# Patient Record
Sex: Male | Born: 1935 | Race: White | Hispanic: No | Smoking: Never smoker
Health system: Southern US, Community
[De-identification: ages and names within clinical notes are randomized; demographics above are authoritative.]

## PROBLEM LIST (undated history)

## (undated) DIAGNOSIS — N289 Disorder of kidney and ureter, unspecified: Secondary | ICD-10-CM

## (undated) DIAGNOSIS — E119 Type 2 diabetes mellitus without complications: Secondary | ICD-10-CM

---

## 2021-04-23 ENCOUNTER — Emergency Department (HOSPITAL_COMMUNITY)
Admission: EM | Admit: 2021-04-23 | Discharge: 2021-04-23 | Disposition: A | Discharge status: Home / Self Care | Payer: No Typology Code available for payment source | Attending: Emergency Medicine | Admitting: Emergency Medicine

## 2021-04-23 ENCOUNTER — Emergency Department (HOSPITAL_COMMUNITY): Payer: No Typology Code available for payment source

## 2021-04-23 ENCOUNTER — Other Ambulatory Visit: Payer: Self-pay

## 2021-04-23 DIAGNOSIS — E1165 Type 2 diabetes mellitus with hyperglycemia: Secondary | ICD-10-CM | POA: Insufficient documentation

## 2021-04-23 DIAGNOSIS — U071 COVID-19: Secondary | ICD-10-CM | POA: Diagnosis not present

## 2021-04-23 DIAGNOSIS — Z9114 Patient's other noncompliance with medication regimen: Secondary | ICD-10-CM | POA: Insufficient documentation

## 2021-04-23 DIAGNOSIS — R739 Hyperglycemia, unspecified: Secondary | ICD-10-CM

## 2021-04-23 DIAGNOSIS — Z7984 Long term (current) use of oral hypoglycemic drugs: Secondary | ICD-10-CM | POA: Diagnosis not present

## 2021-04-23 DIAGNOSIS — R531 Weakness: Secondary | ICD-10-CM | POA: Diagnosis present

## 2021-04-23 LAB — BASIC METABOLIC PANEL
Anion gap: 9 (ref 5–15)
BUN: 22 mg/dL (ref 8–23)
CO2: 28 mmol/L (ref 22–32)
Calcium: 8.7 mg/dL — ABNORMAL LOW (ref 8.9–10.3)
Chloride: 94 mmol/L — ABNORMAL LOW (ref 98–111)
Creatinine, Ser: 1.29 mg/dL — ABNORMAL HIGH (ref 0.61–1.24)
GFR, Estimated: 53 mL/min — ABNORMAL LOW (ref >60)
Glucose, Bld: 292 mg/dL — ABNORMAL HIGH (ref 70–99)
Potassium: 4.7 mmol/L (ref 3.5–5.1)
Sodium: 131 mmol/L — ABNORMAL LOW (ref 135–145)

## 2021-04-23 LAB — URINALYSIS, ROUTINE W REFLEX MICROSCOPIC
Bacteria, UA: NONE SEEN
Bilirubin Urine: NEGATIVE
Glucose, UA: >=500 mg/dL — AB
Hgb urine dipstick: NEGATIVE
Ketones, ur: NEGATIVE mg/dL
Leukocytes,Ua: NEGATIVE
Nitrite: NEGATIVE
Protein, ur: NEGATIVE mg/dL
Specific Gravity, Urine: 1.018 (ref 1.005–1.030)
pH: 6 (ref 5.0–8.0)

## 2021-04-23 LAB — CBC
HCT: 40.1 % (ref 39.0–52.0)
Hemoglobin: 13.3 g/dL (ref 13.0–17.0)
MCH: 30.5 pg (ref 26.0–34.0)
MCHC: 33.2 g/dL (ref 30.0–36.0)
MCV: 92 fL (ref 80.0–100.0)
Platelets: 122 10*3/uL — ABNORMAL LOW (ref 150–400)
RBC: 4.36 MIL/uL (ref 4.22–5.81)
RDW: 12.5 % (ref 11.5–15.5)
WBC: 4.9 10*3/uL (ref 4.0–10.5)
nRBC: 0 % (ref 0.0–0.2)

## 2021-04-23 LAB — HEPATIC FUNCTION PANEL
ALT: 18 U/L (ref 0–44)
AST: 20 U/L (ref 15–41)
Albumin: 3.5 g/dL (ref 3.5–5.0)
Alkaline Phosphatase: 71 U/L (ref 38–126)
Bilirubin, Direct: 0.1 mg/dL (ref 0.0–0.2)
Indirect Bilirubin: 0.7 mg/dL (ref 0.3–0.9)
Total Bilirubin: 0.8 mg/dL (ref 0.3–1.2)
Total Protein: 6.4 g/dL — ABNORMAL LOW (ref 6.5–8.1)

## 2021-04-23 LAB — CBG MONITORING, ED
Glucose-Capillary: 302 mg/dL — ABNORMAL HIGH (ref 70–99)
Glucose-Capillary: 262 mg/dL — ABNORMAL HIGH (ref 70–99)
Glucose-Capillary: 229 mg/dL — ABNORMAL HIGH (ref 70–99)

## 2021-04-23 LAB — RESP PANEL BY RT-PCR (FLU A&B, COVID) ARPGX2
Influenza A by PCR: NEGATIVE
Influenza B by PCR: NEGATIVE
SARS Coronavirus 2 by RT PCR: POSITIVE — AB

## 2021-04-23 MED ORDER — LACTATED RINGERS IV BOLUS
1000.0000 mL | Freq: Once | INTRAVENOUS | Status: AC
  Administered 2021-04-23: 1000 mL via INTRAVENOUS

## 2021-04-23 MED ORDER — METFORMIN HCL 500 MG PO TABS: 500.0000 mg | ORAL_TABLET | Freq: Two times a day (BID) | ORAL | 0 refills | Status: DC

## 2021-04-23 NOTE — Discharge Instructions (Addendum)
Continue Paxlovid until completed ?Drink plenty of fluids ?Take your metformin as prescribed ?Follow your blood sugars at home ?Return to the emergency department you have having worsening weakness, shortness of breath, or new pain or fevers ?

## 2021-04-23 NOTE — ED Provider Notes (Incomplete)
[facility] EMERGENCY DEPARTMENT Provider Note   CSN: [id number] Arrival date & time: 04/23/21  1812     History {Add pertinent medical, surgical, social history, OB history to HPI:1} Chief Complaint  Patient presents with   Hyperglycemia   Weakness    Paul Colon is a 88 y.o. male.  HPI 88 year old male history of type 2 diabetes supposed to be on metformin, recent COVID diagnosis, presents today with his daughter with reports of increased weakness and hyperglycemia.  She states that he began having symptoms approximately Tuesday of last week, on day day 5 of symptoms.  He was diagnosed on Friday and Paxlovid was started.  At that time his blood sugar was elevated at 330.  His daughter states that he has had increased weakness and decreased urine output and his blood sugar measured 365.  She brings him in today secondary to this.  He has been on metformin previously but has not been compliant with his medications.  He is not a smoker.    Home Medications Prior to Admission medications   Not on File      Allergies    Patient has no known allergies.    Review of Systems   Review of Systems  All other systems reviewed and are negative.  Physical Exam Updated Vital Signs BP (!) 157/64    Pulse 61    Temp 98 F (36.7 C) (Oral)    Resp 18    SpO2 99%  Physical Exam Vitals and nursing note reviewed.  Constitutional:      Appearance: Normal appearance.  HENT:     Head: Normocephalic.     Right Ear: External ear normal.     Left Ear: External ear normal.     Nose: Nose normal.     Mouth/Throat:     Mouth: Mucous membranes are dry.  Eyes:     Pupils: Pupils are equal, round, and reactive to light.  Cardiovascular:     Rate and Rhythm: Normal rate and regular rhythm.     Pulses: Normal pulses.     Heart sounds: Normal heart sounds.  Pulmonary:     Effort: Pulmonary effort is normal.  Abdominal:     General: Abdomen is flat.     Palpations: Abdomen  is soft.  Musculoskeletal:        General: No swelling. Normal range of motion.     Cervical back: Normal range of motion.     Right lower leg: No edema.     Left lower leg: No edema.  Skin:    General: Skin is warm and dry.     Capillary Refill: Capillary refill takes less than 2 seconds.  Neurological:     General: No focal deficit present.     Mental Status: He is alert.  Psychiatric:        Mood and Affect: Mood normal.    ED Results / Procedures / Treatments   Labs (all labs ordered are listed, but only abnormal results are displayed) Labs Reviewed  CBG MONITORING, ED - Abnormal; Notable for the following components:      Result Value   Glucose-Capillary 302 (*)    All other components within normal limits  CBG MONITORING, ED - Abnormal; Notable for the following components:   Glucose-Capillary 262 (*)    All other components within normal limits  BASIC METABOLIC PANEL  CBC  URINALYSIS, ROUTINE W REFLEX MICROSCOPIC    EKG None  Radiology No results  found.  Procedures Procedures  {Document cardiac monitor, telemetry assessment procedure when appropriate:1}  Medications Ordered in ED Medications - No data to display  ED Course/ Medical Decision Making/ A&P Clinical Course as of 04/23/21 2201  Sun Apr 23, 2021  1934 X-Paul Colon personally reviewed and interpreted Radiologist interpretation with left base atelectasis [DR]  2956 Basic metabolic panel(!) Basic metabolic panel reviewed and interpreted and consistent with hyperglycemia with glucose elevated at 292, anion gap normal at 9 [DR]  1950 CBC(!) [DR]  2032 We met reviewed and interpreted with hyperglycemia without any evidence of acute KA Creatinine slightly elevated at 1.29 which may be normal for patient but given his recent illness and volume depletion and suspected slightly elevated [DR]    Clinical Course User Index [DR] Paul Boss, MD                           Medical Decision Making 88 year old  male recent diagnosis of COVID, noncompliant with oral hypoglycemic agents.  Presents today with generalized weakness.  Glucose elevated at 300, no evidence of DKA, Creatinine 1.29 unknown baseline Patient hypertensive here. No definitive focal infiltrate on chest x-Tecia Cinnamon Oxygen saturation 100% Discussed results with patient and daughter.  Patient has not been taking his metformin and states that it is worse for his body than taking it.  After speaking with his daughter it does not sound like he has been following with primary care on a regular basis.  We have discussed that she can call the number on her discharge for follow-up with primary care.  He has been on Paxlovid for his COVID and appears hemodynamically stable for  Amount and/or Complexity of Data Reviewed Independent Historian:     Details: Received history from his daughter Labs: ordered. Decision-making details documented in ED Course. Radiology: ordered.     {Document critical care time when appropriate:1} {Document review of labs and clinical decision tools ie heart score, Chads2Vasc2 etc:1}  {Document your independent review of radiology images, and any outside records:1} {Document your discussion with family members, caretakers, and with consultants:1} {Document social determinants of health affecting pt's care:1} {Document your decision making why or why not admission, treatments were needed:1} Final Clinical Impression(s) / ED Diagnoses Final diagnoses:  None    Rx / DC Orders ED Discharge Orders     None

## 2021-04-23 NOTE — ED Notes (Signed)
RN called lab to add CMP. Lab technician recommended to add hepatic function panel to get the CMP since a BMP is currently in process. ?

## 2021-04-23 NOTE — ED Triage Notes (Signed)
Pt here w/ his daughter for weakness, lack of energy and hyperglycemia. Pt tested positive for covid on Friday but per daughter pt had a decrease in energy since Tuesday. Pt denies pain, but stated he hasn't been urinating much. ?

## 2021-07-13 ENCOUNTER — Ambulatory Visit: Payer: No Typology Code available for payment source

## 2021-07-13 ENCOUNTER — Ambulatory Visit (INDEPENDENT_AMBULATORY_CARE_PROVIDER_SITE_OTHER): Payer: No Typology Code available for payment source | Admitting: Podiatry

## 2021-07-13 DIAGNOSIS — M2041 Other hammer toe(s) (acquired), right foot: Secondary | ICD-10-CM | POA: Diagnosis not present

## 2021-07-13 DIAGNOSIS — E1149 Type 2 diabetes mellitus with other diabetic neurological complication: Secondary | ICD-10-CM

## 2021-07-13 DIAGNOSIS — R2681 Unsteadiness on feet: Secondary | ICD-10-CM

## 2021-07-13 DIAGNOSIS — B353 Tinea pedis: Secondary | ICD-10-CM | POA: Diagnosis not present

## 2021-07-13 DIAGNOSIS — M2042 Other hammer toe(s) (acquired), left foot: Secondary | ICD-10-CM

## 2021-07-13 DIAGNOSIS — E119 Type 2 diabetes mellitus without complications: Secondary | ICD-10-CM

## 2021-07-13 MED ORDER — KETOCONAZOLE 2 % EX CREA: 1.0000 "application " | TOPICAL_CREAM | Freq: Every day | CUTANEOUS | 0 refills | Status: DC

## 2021-07-13 NOTE — Progress Notes (Signed)
SITUATION Reason for Consult: Evaluation for Prefabricated Diabetic Shoes and Custom Diabetic Inserts. Patient / Caregiver Report: Patient would like well fitting shoes  OBJECTIVE DATA: Patient History / Diagnosis:    ICD-10-CM   1. Diabetes mellitus without complication (HCC)  XX123456     2. Acquired hammertoes of both feet  M20.41    M20.42       Physician Treating Diabetes:  [facility]  Current or Previous Devices:   None and no history  In-Person Foot Examination: Ulcers & Callousing:   None Deformities:    Hammertoes Sensation:    Intact  Shoe Size:     12W  ORTHOTIC RECOMMENDATION Recommended Devices: - 1x pair prefabricated PDAC approved diabetic shoes; Patient Selected Apex [id number] Size 12W - 3x pair custom-to-patient PDAC approved vacuum formed diabetic insoles.  GOALS OF SHOES AND INSOLES - Reduce shear and pressure - Reduce / Prevent callus formation - Reduce / Prevent ulceration - Protect the fragile healing compromised diabetic foot.  Patient would benefit from diabetic shoes and inserts as patient has diabetes mellitus and the patient has one or more of the following conditions: - History of partial or complete amputation of the foot - History of previous foot ulceration. - History of pre-ulcerative callus - Peripheral neuropathy with evidence of callus formation - Foot deformity - Poor circulation  ACTIONS PERFORMED Potential out of pocket cost was communicated to patient. Patient understood and consented to measurement and casting. Patient was casted for insoles via crush box and measured for shoes via brannock device. Procedure was explained and patient tolerated procedure well. All questions were answered and concerns addressed. [facility] form generated and sent to Dr. Jacqualyn Posey for signature and faxing. Casts were shipped to central fabrication for HOLD until Certificate of Medical Necessity or otherwise necessary authorization from insurance is  obtained.  PLAN Shoes are to be ordered and casts released from hold once all appropriate paperwork is complete. Patient is to be contacted and scheduled for fitting once shoes and insoles have been fabricated and received.

## 2021-08-01 ENCOUNTER — Telehealth: Payer: Self-pay

## 2021-08-01 NOTE — Telephone Encounter (Signed)
CMN Received - Shoes ordered and casts released from fabrication hold.  

## 2021-08-25 ENCOUNTER — Emergency Department (HOSPITAL_COMMUNITY): Payer: No Typology Code available for payment source

## 2021-08-25 ENCOUNTER — Encounter (HOSPITAL_COMMUNITY): Payer: Self-pay

## 2021-08-25 ENCOUNTER — Emergency Department (HOSPITAL_COMMUNITY)
Admission: EM | Admit: 2021-08-25 | Discharge: 2021-08-25 | Disposition: A | Discharge status: Home / Self Care | Payer: No Typology Code available for payment source | Attending: Emergency Medicine | Admitting: Emergency Medicine

## 2021-08-25 ENCOUNTER — Other Ambulatory Visit: Payer: Self-pay

## 2021-08-25 DIAGNOSIS — E871 Hypo-osmolality and hyponatremia: Secondary | ICD-10-CM

## 2021-08-25 DIAGNOSIS — Z7984 Long term (current) use of oral hypoglycemic drugs: Secondary | ICD-10-CM | POA: Insufficient documentation

## 2021-08-25 DIAGNOSIS — E1140 Type 2 diabetes mellitus with diabetic neuropathy, unspecified: Secondary | ICD-10-CM | POA: Diagnosis not present

## 2021-08-25 DIAGNOSIS — E1165 Type 2 diabetes mellitus with hyperglycemia: Secondary | ICD-10-CM | POA: Diagnosis not present

## 2021-08-25 DIAGNOSIS — R531 Weakness: Secondary | ICD-10-CM | POA: Diagnosis present

## 2021-08-25 HISTORY — DX: Disorder of kidney and ureter, unspecified: N28.9

## 2021-08-25 HISTORY — DX: Type 2 diabetes mellitus without complications: E11.9

## 2021-08-25 LAB — CBC WITH DIFFERENTIAL/PLATELET
Abs Immature Granulocytes: 0.01 10*3/uL (ref 0.00–0.07)
Basophils Absolute: 0 10*3/uL (ref 0.0–0.1)
Basophils Relative: 1 %
Eosinophils Absolute: 0.1 10*3/uL (ref 0.0–0.5)
Eosinophils Relative: 2 %
HCT: 35.7 % — ABNORMAL LOW (ref 39.0–52.0)
Hemoglobin: 12.4 g/dL — ABNORMAL LOW (ref 13.0–17.0)
Immature Granulocytes: 0 %
Lymphocytes Relative: 32 %
Lymphs Abs: 1.8 10*3/uL (ref 0.7–4.0)
MCH: 31.6 pg (ref 26.0–34.0)
MCHC: 34.7 g/dL (ref 30.0–36.0)
MCV: 90.8 fL (ref 80.0–100.0)
Monocytes Absolute: 0.5 10*3/uL (ref 0.1–1.0)
Monocytes Relative: 10 %
Neutro Abs: 3.2 10*3/uL (ref 1.7–7.7)
Neutrophils Relative %: 55 %
Platelets: 263 10*3/uL (ref 150–400)
RBC: 3.93 MIL/uL — ABNORMAL LOW (ref 4.22–5.81)
RDW: 12.4 % (ref 11.5–15.5)
WBC: 5.7 10*3/uL (ref 4.0–10.5)
nRBC: 0 % (ref 0.0–0.2)

## 2021-08-25 LAB — COMPREHENSIVE METABOLIC PANEL
ALT: 13 U/L (ref 0–44)
AST: 20 U/L (ref 15–41)
Albumin: 3.8 g/dL (ref 3.5–5.0)
Alkaline Phosphatase: 59 U/L (ref 38–126)
Anion gap: 8 (ref 5–15)
BUN: 18 mg/dL (ref 8–23)
CO2: 27 mmol/L (ref 22–32)
Calcium: 9.1 mg/dL (ref 8.9–10.3)
Chloride: 91 mmol/L — ABNORMAL LOW (ref 98–111)
Creatinine, Ser: 1.18 mg/dL (ref 0.61–1.24)
GFR, Estimated: 59 mL/min — ABNORMAL LOW (ref >60)
Glucose, Bld: 142 mg/dL — ABNORMAL HIGH (ref 70–99)
Potassium: 4.8 mmol/L (ref 3.5–5.1)
Sodium: 126 mmol/L — ABNORMAL LOW (ref 135–145)
Total Bilirubin: 0.5 mg/dL (ref 0.3–1.2)
Total Protein: 6.4 g/dL — ABNORMAL LOW (ref 6.5–8.1)

## 2021-08-25 LAB — URINALYSIS, ROUTINE W REFLEX MICROSCOPIC
Bilirubin Urine: NEGATIVE
Glucose, UA: NEGATIVE mg/dL
Hgb urine dipstick: NEGATIVE
Ketones, ur: NEGATIVE mg/dL
Leukocytes,Ua: NEGATIVE
Nitrite: NEGATIVE
Protein, ur: NEGATIVE mg/dL
Specific Gravity, Urine: 1.015 (ref 1.005–1.030)
pH: 5 (ref 5.0–8.0)

## 2021-08-25 LAB — OSMOLALITY, URINE: Osmolality, Ur: 530 mOsm/kg (ref 300–900)

## 2021-08-25 LAB — TROPONIN I (HIGH SENSITIVITY): Troponin I (High Sensitivity): 4 ng/L (ref <18)

## 2021-08-25 LAB — OSMOLALITY: Osmolality: 268 mOsm/kg — ABNORMAL LOW (ref 275–295)

## 2021-08-25 LAB — CBG MONITORING, ED: Glucose-Capillary: 129 mg/dL — ABNORMAL HIGH (ref 70–99)

## 2021-08-25 LAB — SODIUM, URINE, RANDOM: Sodium, Ur: 37 mmol/L

## 2021-08-25 LAB — CK: Total CK: 43 U/L — ABNORMAL LOW (ref 49–397)

## 2021-08-25 MED ORDER — SODIUM CHLORIDE 0.9 % IV BOLUS
1000.0000 mL | Freq: Once | INTRAVENOUS | Status: AC
  Administered 2021-08-25: 1000 mL via INTRAVENOUS

## 2021-08-25 MED ORDER — SODIUM CHLORIDE 0.9 % IV BOLUS
500.0000 mL | Freq: Once | INTRAVENOUS | Status: AC
Start: 2021-08-25 — End: 2021-08-25
  Administered 2021-08-25: 500 mL via INTRAVENOUS

## 2021-08-25 NOTE — Discharge Instructions (Addendum)
Please read and follow all provided instructions.  Your diagnoses today include:  1. Hyponatremia   2. Weakness     Tests performed today include: Blood counts and electrolytes: Sodium was a bit low at 126, but your kidney function looked good.  Your white blood cell count was also normal. Liver function testing: Was normal Blood test to check for stress on the heart/EKG: No signs of heart problems or irregular heartbeats Urine test: No signs of urine infection CT of the head: No signs of stroke or unexpected changes in the brain Vital signs. See below for your results today.   Medications prescribed:  None  Take any prescribed medications only as directed.  Home care instructions:  Follow any educational materials contained in this packet.  Please increase level of salt in your diet, either by drinking fluids like Gatorade that have electrolytes, or increasing the salt content in your food.  Your urine should be clear to light yellow.   Follow-up instructions: Please follow-up with your primary care provider in the next 3 days for further evaluation of your symptoms.   Return instructions:  Please return to the Emergency Department if you experience worsening symptoms.  Return if you have weakness in your arms or legs, slurred speech, trouble walking or talking, confusion, or trouble with your balance.  Please return if you have any other emergent concerns.  Additional Information:  Your vital signs today were: BP (!) 171/85   Pulse (!) 57   Temp 98.8 F (37.1 C) (Oral)   Resp 18   Ht 5' 11.5" (1.816 m)   Wt 70.3 kg   SpO2 99%   BMI 21.32 kg/m  If your blood pressure (BP) was elevated above 135/85 this visit, please have this repeated by your doctor within one month. --------------

## 2021-08-25 NOTE — ED Provider Triage Note (Signed)
Emergency Medicine Provider Triage Evaluation Note  Jospeh Colon , a 88 y.o. male  was evaluated in triage.  Pt complains of feeling weak.  This has been much worse over the past few days.    I received a call from the [place] PTA that his sodium is low.  He hasn't been urinating during the day much but a lot at night.    On Monday his post void was 83 per daughter.   He has been weak since Wednesday per his daughter.    Physical Exam  BP 103/68 (BP Location: Right Arm)   Pulse 76   Temp 98.8 F (37.1 C) (Oral)   Resp 18   Ht 5' 11.5" (1.816 m)   Wt 70.3 kg   SpO2 97%   BMI 21.32 kg/m  Gen:   Awake, no distress   Resp:  Normal effort  MSK:   Moves extremities without difficulty  Other:  Normal speech.   Medical Decision Making  Medically screening exam initiated at 7:25 PM.  Appropriate orders placed.  Sebastion Jun was informed that the remainder of the evaluation will be completed by another provider, this initial triage assessment does not replace that evaluation, and the importance of remaining in the ED until their evaluation is complete.     Cristina Gong, [place] 08/25/21 1929

## 2021-08-25 NOTE — ED Provider Notes (Signed)
[facility] EMERGENCY DEPARTMENT Provider Note   CSN: [id number] Arrival date & time: 08/25/21  1701     History  Chief Complaint  Patient presents with   Weakness    Paul Colon is a 88 y.o. male.  Presents to the emergency department today for weakness, orthostatic hypotension, and hyponatremia.  He has a history of diabetes and is on metformin, otherwise just eyedrops for glaucoma.  Reports some difficulty with urine stream recently.  He has been having generalized weakness over the past 2 days and went and had labs checked at the [place] today, after a hearing aid check.  At [facility], sodium was 123 and he was referred to the emergency department. Patient denies focal signs of stroke including: facial droop, slurred speech, aphasia, weakness/numbness in extremities, imbalance/trouble walking.  Does report drinking approximately 6 glasses of tap water per day.  He is not on medication for hypertension or diuretics.  He has neuropathy in his feet which causes some unsteadiness, however this has been worse over the past 2 days per the patient's daughter at bedside.        Home Medications Prior to Admission medications   Medication Sig Start Date End Date Taking? Authorizing Provider  ketoconazole (NIZORAL) 2 % cream Apply 1 application. topically daily. 07/13/21   Vivi Barrack, DPM  metFORMIN (GLUCOPHAGE) 500 MG tablet Take 1 tablet (500 mg total) by mouth 2 (two) times daily with a meal. 04/23/21   Margarita Grizzle, MD      Allergies    Patient has no known allergies.    Review of Systems   Review of Systems  Physical Exam Updated Vital Signs BP 103/68 (BP Location: Right Arm)   Pulse 76   Temp 98.8 F (37.1 C) (Oral)   Resp 18   Ht 5' 11.5" (1.816 m)   Wt 70.3 kg   SpO2 97%   BMI 21.32 kg/m   Physical Exam Vitals and nursing note reviewed.  Constitutional:      Appearance: He is well-developed.  HENT:     Head: Normocephalic and atraumatic.      Right Ear: Tympanic membrane, ear canal and external ear normal.     Left Ear: Tympanic membrane, ear canal and external ear normal.     Nose: Nose normal.     Mouth/Throat:     Pharynx: Uvula midline.  Eyes:     General: Lids are normal.     Conjunctiva/sclera: Conjunctivae normal.     Pupils: Pupils are equal, round, and reactive to light.  Cardiovascular:     Rate and Rhythm: Normal rate and regular rhythm.  Pulmonary:     Effort: Pulmonary effort is normal.     Breath sounds: Normal breath sounds.  Abdominal:     Palpations: Abdomen is soft.     Tenderness: There is no abdominal tenderness.  Musculoskeletal:        General: Normal range of motion.     Cervical back: Normal range of motion and neck supple. No tenderness or bony tenderness.  Skin:    General: Skin is warm and dry.  Neurological:     Mental Status: He is alert and oriented to person, place, and time.     GCS: GCS eye subscore is 4. GCS verbal subscore is 5. GCS motor subscore is 6.     Cranial Nerves: No cranial nerve deficit.     Sensory: No sensory deficit.     Motor: No abnormal muscle  tone.     Coordination: Coordination normal.     Gait: Gait normal.     ED Results / Procedures / Treatments   Labs (all labs ordered are listed, but only abnormal results are displayed) Labs Reviewed  URINALYSIS, ROUTINE W REFLEX MICROSCOPIC - Abnormal; Notable for the following components:      Result Value   APPearance HAZY (*)    All other components within normal limits  COMPREHENSIVE METABOLIC PANEL - Abnormal; Notable for the following components:   Sodium 126 (*)    Chloride 91 (*)    Glucose, Bld 142 (*)    Total Protein 6.4 (*)    GFR, Estimated 59 (*)    All other components within normal limits  CBC WITH DIFFERENTIAL/PLATELET - Abnormal; Notable for the following components:   RBC 3.93 (*)    Hemoglobin 12.4 (*)    HCT 35.7 (*)    All other components within normal limits  CK - Abnormal; Notable for  the following components:   Total CK 43 (*)    All other components within normal limits  OSMOLALITY - Abnormal; Notable for the following components:   Osmolality 268 (*)    All other components within normal limits  CBG MONITORING, ED - Abnormal; Notable for the following components:   Glucose-Capillary 129 (*)    All other components within normal limits  SODIUM, URINE, RANDOM  OSMOLALITY, URINE  TROPONIN I (HIGH SENSITIVITY)    EKG EKG Interpretation  Date/Time:  Friday August 25 2021 18:12:02 EDT Ventricular Rate:  72 PR Interval:  218 QRS Duration: 72 QT Interval:  364 QTC Calculation: 398 R Axis:   15 Text Interpretation: Sinus rhythm with 1st degree A-V block Confirmed by Cathren Laine ([id number]) on 08/25/2021 7:40:54 PM  Radiology CT HEAD WO CONTRAST ( )  Result Date: 08/25/2021 CLINICAL DATA:  generalized weakness x 2 days, trouble walking EXAM: CT HEAD WITHOUT CONTRAST TECHNIQUE: Contiguous axial images were obtained from the base of the skull through the vertex without intravenous contrast. RADIATION DOSE REDUCTION: This exam was performed according to the departmental dose-optimization program which includes automated exposure control, adjustment of the mA and/or kV according to patient size and/or use of iterative reconstruction technique. COMPARISON:  None Available. FINDINGS: Brain: Normal for age atrophy. No intracranial hemorrhage, mass effect, or midline shift. No hydrocephalus. The basilar cisterns are patent. Mild periventricular chronic small vessel ischemia. No evidence of territorial infarct or acute ischemia. No extra-axial or intracranial fluid collection. Vascular: Atherosclerosis of skullbase vasculature without hyperdense vessel or abnormal calcification. Skull: No fracture or focal lesion. Sinuses/Orbits: Minimal frothy debris in the right frontal sinus. No sinus fluid levels. No mastoid effusion. The visualized orbits are unremarkable. Bilateral ocular surgery.  Other: None. IMPRESSION: 1. No acute intracranial abnormality. 2. Age-related atrophy and chronic small vessel ischemia. Electronically Signed   By: Narda Rutherford M.D.   On: 08/25/2021 21:25    Procedures Procedures    Medications Ordered in ED Medications  sodium chloride 0.9 % bolus 500 mL (0 mLs Intravenous Stopped 08/25/21 2136)  sodium chloride 0.9 % bolus 1,000 mL (1,000 mLs Intravenous New Bag/Given 08/25/21 2138)    ED Course/ Medical Decision Making/ A&P    Patient seen and examined. History obtained directly from patient.   Labs/EKG: EKG personally reviewed and interpreted.  Ordered CBC, CMP, osmolality, urine sodium and osmolality, CK, troponin.  UA pending.  Imaging: CT head.  Medications/Fluids: None ordered.  Most recent vital signs reviewed and  are as follows: BP 103/68 (BP Location: Right Arm)   Pulse 76   Temp 98.8 F (37.1 C) (Oral)   Resp 18   Ht 5' 11.5" (1.816 m)   Wt 70.3 kg   SpO2 97%   BMI 21.32 kg/m   Initial impression: Generalized weakness, hyponatremia.  11:01 PM Reassessment performed. Patient appears well. Family states he looks better. Has received 1.5L NS, 500cc urinary output, light yellow color.   Patient discussed with Dr. Denton Lank, who has seen patient. Agrees with hydration, d/c with close outpatient follow-up. Pt and family in agreement. Will reassess orthostatic VS prior to d/c.   Labs personally reviewed and interpreted including: CBC with normal white blood cell count, hemoglobin 12.4 otherwise unremarkable; CMP with hyponatremia 126, chloride 91, glucose 142, creatinine at 1.18 with normal BUN; CK 43 not elevated; Trop normal; Osmolality low 268 with calculated osmolal gap 2 normal; normal urine sodium, normal urine osmolality.   Imaging personally visualized and interpreted including: CT head agree no acute findings.   Reviewed pertinent lab work and imaging with patient/family at bedside. Questions answered.   Most current  vital signs reviewed and are as follows: BP (!) 171/85   Pulse (!) 57   Temp 98.8 F (37.1 C) (Oral)   Resp 18   Ht 5' 11.5" (1.816 m)   Wt 70.3 kg   SpO2 99%   BMI 21.32 kg/m   Plan: D/c to home with close PCP f/u.   Orthostatic VS for the past 24 hrs:  BP- Lying Pulse- Lying BP- Sitting Pulse- Sitting BP- Standing at 0 minutes Pulse- Standing at 0 minutes  08/25/21 2311 179/75 59 140/78 79 127/63 63   Patient urged to return with worsening symptoms or other concerns. Patient verbalized understanding and agrees with plan.                           Medical Decision Making Amount and/or Complexity of Data Reviewed Labs: ordered. Radiology: ordered.   Patient here with generalized weakness over the past 2 days with hyponatremia, 126 here today.  Likely cause due to increase in drinking free water recently.  No diuretics or other medications.  Blood sugars relatively controlled.  Also considered cardiac etiology of generalized weakness, however EKG without arrhythmia and no ischemic findings and troponin is normal.  Considered central neurologic cause, however head CT is negative for any acute findings.  No focal neuro symptoms to suggest stroke.  Considered infection, white blood cell count is normal, no UTI, no other focal symptoms of infection.  No signs of rhabdomyolysis.  History of CKD, however kidney function is normal here today.  Patient was treated with IV fluids and seems to be doing a bit better.  Vital signs are reassuring blood pressure does drop with standing but no lightheadedness or syncope.  We will plan to have patient increase sodium in food and drink over the weekend and follow-up with PCP next week for recheck of symptoms and sodium level.  The patient's vital signs, pertinent lab work and imaging were reviewed and interpreted as discussed in the ED course. Hospitalization was considered for further testing, treatments, or serial exams/observation. However as patient  is well-appearing, has a stable exam, and reassuring studies today, I do not feel that they warrant admission at this time. This plan was discussed with the patient who verbalizes agreement and comfort with this plan and seems reliable and able to return to the  Emergency Department with worsening or changing symptoms.          Final Clinical Impression(s) / ED Diagnoses Final diagnoses:  Hyponatremia  Weakness    Rx / DC Orders ED Discharge Orders     None         Renne Crigler, PA-C 08/25/21 2319    Cathren Laine, MD 08/25/21 2350

## 2021-08-25 NOTE — ED Triage Notes (Signed)
Pt c/o weakness for past few days. Pt was seen at [facility] and sent to ED for low sodium.  Pt had orthostatics at [place] and daughter states BP went down upon standing.  139/77 while lying, 136/78 sitting, 111/71 when standing.

## 2021-09-05 ENCOUNTER — Encounter (HOSPITAL_COMMUNITY): Payer: Self-pay | Admitting: *Deleted

## 2021-09-05 ENCOUNTER — Other Ambulatory Visit: Payer: Self-pay

## 2021-09-05 ENCOUNTER — Observation Stay (HOSPITAL_COMMUNITY)
Admission: EM | Admit: 2021-09-05 | Discharge: 2021-09-07 | Disposition: A | Discharge status: Home / Self Care | Payer: No Typology Code available for payment source | Attending: Internal Medicine | Admitting: Internal Medicine

## 2021-09-05 DIAGNOSIS — Z79899 Other long term (current) drug therapy: Secondary | ICD-10-CM | POA: Insufficient documentation

## 2021-09-05 DIAGNOSIS — I129 Hypertensive chronic kidney disease with stage 1 through stage 4 chronic kidney disease, or unspecified chronic kidney disease: Secondary | ICD-10-CM | POA: Insufficient documentation

## 2021-09-05 DIAGNOSIS — E871 Hypo-osmolality and hyponatremia: Principal | ICD-10-CM | POA: Insufficient documentation

## 2021-09-05 DIAGNOSIS — D631 Anemia in chronic kidney disease: Secondary | ICD-10-CM | POA: Insufficient documentation

## 2021-09-05 DIAGNOSIS — Z8616 Personal history of COVID-19: Secondary | ICD-10-CM | POA: Insufficient documentation

## 2021-09-05 DIAGNOSIS — E119 Type 2 diabetes mellitus without complications: Secondary | ICD-10-CM

## 2021-09-05 DIAGNOSIS — R531 Weakness: Secondary | ICD-10-CM

## 2021-09-05 DIAGNOSIS — E1122 Type 2 diabetes mellitus with diabetic chronic kidney disease: Secondary | ICD-10-CM | POA: Diagnosis not present

## 2021-09-05 DIAGNOSIS — Z7984 Long term (current) use of oral hypoglycemic drugs: Secondary | ICD-10-CM | POA: Diagnosis not present

## 2021-09-05 DIAGNOSIS — N1831 Chronic kidney disease, stage 3a: Secondary | ICD-10-CM | POA: Diagnosis not present

## 2021-09-05 DIAGNOSIS — D649 Anemia, unspecified: Secondary | ICD-10-CM

## 2021-09-05 DIAGNOSIS — E1165 Type 2 diabetes mellitus with hyperglycemia: Secondary | ICD-10-CM

## 2021-09-05 LAB — CBC WITH DIFFERENTIAL/PLATELET
Abs Immature Granulocytes: 0.01 10*3/uL (ref 0.00–0.07)
Basophils Absolute: 0.1 10*3/uL (ref 0.0–0.1)
Basophils Relative: 1 %
Eosinophils Absolute: 0.2 10*3/uL (ref 0.0–0.5)
Eosinophils Relative: 3 %
HCT: 34 % — ABNORMAL LOW (ref 39.0–52.0)
Hemoglobin: 11.5 g/dL — ABNORMAL LOW (ref 13.0–17.0)
Immature Granulocytes: 0 %
Lymphocytes Relative: 34 %
Lymphs Abs: 1.8 10*3/uL (ref 0.7–4.0)
MCH: 31.3 pg (ref 26.0–34.0)
MCHC: 33.8 g/dL (ref 30.0–36.0)
MCV: 92.6 fL (ref 80.0–100.0)
Monocytes Absolute: 0.6 10*3/uL (ref 0.1–1.0)
Monocytes Relative: 10 %
Neutro Abs: 2.9 10*3/uL (ref 1.7–7.7)
Neutrophils Relative %: 52 %
Platelets: 264 10*3/uL (ref 150–400)
RBC: 3.67 MIL/uL — ABNORMAL LOW (ref 4.22–5.81)
RDW: 12.4 % (ref 11.5–15.5)
WBC: 5.5 10*3/uL (ref 4.0–10.5)
nRBC: 0 % (ref 0.0–0.2)

## 2021-09-05 LAB — URINALYSIS, ROUTINE W REFLEX MICROSCOPIC
Bilirubin Urine: NEGATIVE
Glucose, UA: NEGATIVE mg/dL
Hgb urine dipstick: NEGATIVE
Ketones, ur: NEGATIVE mg/dL
Leukocytes,Ua: NEGATIVE
Nitrite: NEGATIVE
Protein, ur: NEGATIVE mg/dL
Specific Gravity, Urine: 1.012 (ref 1.005–1.030)
pH: 7 (ref 5.0–8.0)

## 2021-09-05 LAB — COMPREHENSIVE METABOLIC PANEL
ALT: 12 U/L (ref 0–44)
AST: 16 U/L (ref 15–41)
Albumin: 3.8 g/dL (ref 3.5–5.0)
Alkaline Phosphatase: 49 U/L (ref 38–126)
Anion gap: 8 (ref 5–15)
BUN: 19 mg/dL (ref 8–23)
CO2: 28 mmol/L (ref 22–32)
Calcium: 8.6 mg/dL — ABNORMAL LOW (ref 8.9–10.3)
Chloride: 91 mmol/L — ABNORMAL LOW (ref 98–111)
Creatinine, Ser: 1.18 mg/dL (ref 0.61–1.24)
GFR, Estimated: 59 mL/min — ABNORMAL LOW (ref >60)
Glucose, Bld: 130 mg/dL — ABNORMAL HIGH (ref 70–99)
Potassium: 4.5 mmol/L (ref 3.5–5.1)
Sodium: 127 mmol/L — ABNORMAL LOW (ref 135–145)
Total Bilirubin: 0.4 mg/dL (ref 0.3–1.2)
Total Protein: 5.9 g/dL — ABNORMAL LOW (ref 6.5–8.1)

## 2021-09-05 NOTE — ED Triage Notes (Signed)
Per pt daughter, pt went to the [place] for a follow up from recent ED visit. He was told his sodium was low and needs evaluation. Pt says he feels weak and difficulty walking.

## 2021-09-05 NOTE — ED Provider Triage Note (Signed)
Emergency Medicine Provider Triage Evaluation Note  Paul Colon , a 88 y.o. male  was evaluated in triage.  Pt complains of ongoing hyponatremia.  He has been seen recently for hyponatremia, and discharged.  His daughter reports that [facility] saying [facility] was 125 today and referred to the ED.   He has been week for multiple weeks, no changes recently.  Physical Exam  BP (!) 142/69 (BP Location: Right Arm)   Pulse 60   Temp 97.7 F (36.5 C) (Oral)   Resp 17   SpO2 99%  Gen:   Awake, no distress   Resp:  Normal effort  MSK:   Moves extremities without difficulty  Other:  Normal speech  Medical Decision Making  Medically screening exam initiated at 8:50 PM.  Appropriate orders placed.  Paul Colon was informed that the remainder of the evaluation will be completed by another provider, this initial triage assessment does not replace that evaluation, and the importance of remaining in the ED until their evaluation is complete.     Cristina Gong, [place] 09/05/21 2055

## 2021-09-06 ENCOUNTER — Encounter (HOSPITAL_COMMUNITY): Payer: Self-pay | Admitting: Internal Medicine

## 2021-09-06 DIAGNOSIS — E871 Hypo-osmolality and hyponatremia: Secondary | ICD-10-CM | POA: Diagnosis not present

## 2021-09-06 DIAGNOSIS — E119 Type 2 diabetes mellitus without complications: Secondary | ICD-10-CM

## 2021-09-06 DIAGNOSIS — E1165 Type 2 diabetes mellitus with hyperglycemia: Secondary | ICD-10-CM

## 2021-09-06 DIAGNOSIS — N1831 Chronic kidney disease, stage 3a: Secondary | ICD-10-CM

## 2021-09-06 DIAGNOSIS — D649 Anemia, unspecified: Secondary | ICD-10-CM

## 2021-09-06 DIAGNOSIS — R531 Weakness: Secondary | ICD-10-CM

## 2021-09-06 LAB — OSMOLALITY: Osmolality: 274 mOsm/kg — ABNORMAL LOW (ref 275–295)

## 2021-09-06 LAB — BASIC METABOLIC PANEL
Anion gap: 9 (ref 5–15)
Anion gap: 6 (ref 5–15)
BUN: 20 mg/dL (ref 8–23)
BUN: 17 mg/dL (ref 8–23)
CO2: 23 mmol/L (ref 22–32)
CO2: 29 mmol/L (ref 22–32)
Calcium: 8.7 mg/dL — ABNORMAL LOW (ref 8.9–10.3)
Calcium: 8.6 mg/dL — ABNORMAL LOW (ref 8.9–10.3)
Chloride: 95 mmol/L — ABNORMAL LOW (ref 98–111)
Chloride: 95 mmol/L — ABNORMAL LOW (ref 98–111)
Creatinine, Ser: 1.05 mg/dL (ref 0.61–1.24)
Creatinine, Ser: 1.12 mg/dL (ref 0.61–1.24)
GFR, Estimated: >60 mL/min (ref >60)
GFR, Estimated: >60 mL/min (ref >60)
Glucose, Bld: 181 mg/dL — ABNORMAL HIGH (ref 70–99)
Glucose, Bld: 156 mg/dL — ABNORMAL HIGH (ref 70–99)
Potassium: 3.9 mmol/L (ref 3.5–5.1)
Potassium: 4.4 mmol/L (ref 3.5–5.1)
Sodium: 127 mmol/L — ABNORMAL LOW (ref 135–145)
Sodium: 130 mmol/L — ABNORMAL LOW (ref 135–145)

## 2021-09-06 LAB — IRON AND TIBC
Iron: 54 ug/dL (ref 45–182)
Saturation Ratios: 20 % (ref 17.9–39.5)
TIBC: 273 ug/dL (ref 250–450)
UIBC: 219 ug/dL

## 2021-09-06 LAB — OSMOLALITY, URINE: Osmolality, Ur: 223 mOsm/kg — ABNORMAL LOW (ref 300–900)

## 2021-09-06 LAB — GLUCOSE, CAPILLARY
Glucose-Capillary: 99 mg/dL (ref 70–99)
Glucose-Capillary: 169 mg/dL — ABNORMAL HIGH (ref 70–99)
Glucose-Capillary: 181 mg/dL — ABNORMAL HIGH (ref 70–99)

## 2021-09-06 LAB — FERRITIN: Ferritin: 307 ng/mL (ref 24–336)

## 2021-09-06 LAB — HEMOGLOBIN A1C
Hgb A1c MFr Bld: 7.3 % — ABNORMAL HIGH (ref 4.8–5.6)
Mean Plasma Glucose: 162.81 mg/dL

## 2021-09-06 LAB — POC OCCULT BLOOD, ED: Fecal Occult Bld: NEGATIVE

## 2021-09-06 LAB — CBG MONITORING, ED: Glucose-Capillary: 136 mg/dL — ABNORMAL HIGH (ref 70–99)

## 2021-09-06 LAB — SODIUM, URINE, RANDOM: Sodium, Ur: 48 mmol/L

## 2021-09-06 MED ORDER — ACETAMINOPHEN 650 MG RE SUPP: 650.0000 mg | Freq: Four times a day (QID) | RECTAL | Status: DC | PRN

## 2021-09-06 MED ORDER — INSULIN ASPART 100 UNIT/ML IJ SOLN: 0.0000 [IU] | Freq: Every day | INTRAMUSCULAR | Status: DC

## 2021-09-06 MED ORDER — ORAL CARE MOUTH RINSE: 15.0000 mL | OROMUCOSAL | Status: DC | PRN

## 2021-09-06 MED ORDER — ACETAMINOPHEN 325 MG PO TABS: 650.0000 mg | ORAL_TABLET | Freq: Four times a day (QID) | ORAL | Status: DC | PRN

## 2021-09-06 MED ORDER — INSULIN ASPART 100 UNIT/ML IJ SOLN
0.0000 [IU] | Freq: Three times a day (TID) | INTRAMUSCULAR | Status: DC
  Administered 2021-09-06: 2 [IU] via SUBCUTANEOUS
  Administered 2021-09-06 – 2021-09-07 (×2): 1 [IU] via SUBCUTANEOUS

## 2021-09-06 MED ORDER — ENOXAPARIN SODIUM 40 MG/0.4ML IJ SOSY
40.0000 mg | PREFILLED_SYRINGE | INTRAMUSCULAR | Status: DC
Start: 2021-09-06 — End: 2021-09-06

## 2021-09-06 MED ORDER — SODIUM CHLORIDE 0.9 % IV SOLN: INTRAVENOUS | Status: DC

## 2021-09-06 NOTE — ED Provider Notes (Signed)
[facility] EMERGENCY DEPARTMENT Provider Note   CSN: [id number] Arrival date & time: 09/05/21  7026     History  Chief Complaint  Patient presents with   Abnormal Labs    Parrish Bonn is a 88 y.o. male.  HPI     This is a 88 year old male who presents with abnormal labs.  Patient presents from the [place].  He had been seen at the [facility] for follow-up to the low sodium numbers last week.  He was seen and evaluated and had a sodium of 126.  Since that time he has not changed his water intake but has increased electrolyte rich fluids and increase salt in his food.  He generally describes being "weak" and having decreased energy especially at the beginning of the day.  Denies dizziness, chest pain, shortness of breath, recent fevers.  He followed up today and repeat sodium was unchanged.  They were advised to come to the emergency room.  Chart reviewed.  Patient was given a liter of fluids and discharged home with [facility] follow-[facility].  Sodium 126.  Patient lives with his daughter.  Home Medications Prior to Admission medications   Medication Sig Start Date End Date Taking? Authorizing Provider  ketoconazole (NIZORAL) 2 % cream Apply 1 application. topically daily. 07/13/21   Vivi Barrack, DPM  metFORMIN (GLUCOPHAGE) 500 MG tablet Take 1 tablet (500 mg total) by mouth 2 (two) times daily with a meal. 04/23/21   Margarita Grizzle, MD      Allergies    Patient has no known allergies.    Review of Systems   Review of Systems  Constitutional:  Negative for fever.  Neurological:  Positive for weakness. Negative for light-headedness.  All other systems reviewed and are negative.   Physical Exam Updated Vital Signs BP 134/66 (BP Location: Right Arm)   Pulse (!) 56   Temp 98.2 F (36.8 C) (Oral)   Resp 18   SpO2 99%  Physical Exam Vitals and nursing note reviewed.  Constitutional:      Appearance: He is well-developed. He is not ill-appearing.  HENT:     Head:  Normocephalic and atraumatic.  Eyes:     Pupils: Pupils are equal, round, and reactive to light.  Cardiovascular:     Rate and Rhythm: Normal rate and regular rhythm.     Heart sounds: Normal heart sounds. No murmur heard. Pulmonary:     Effort: Pulmonary effort is normal. No respiratory distress.     Breath sounds: Normal breath sounds. No wheezing.  Abdominal:     General: Bowel sounds are normal.     Palpations: Abdomen is soft.     Tenderness: There is no abdominal tenderness. There is no rebound.  Musculoskeletal:     Cervical back: Neck supple.     Right lower leg: No edema.     Left lower leg: No edema.  Lymphadenopathy:     Cervical: No cervical adenopathy.  Skin:    General: Skin is warm and dry.  Neurological:     Mental Status: He is alert and oriented to person, place, and time.  Psychiatric:        Mood and Affect: Mood normal.     ED Results / Procedures / Treatments   Labs (all labs ordered are listed, but only abnormal results are displayed) Labs Reviewed  COMPREHENSIVE METABOLIC PANEL - Abnormal; Notable for the following components:      Result Value   Sodium 127 (*)  Chloride 91 (*)    Glucose, Bld 130 (*)    Calcium 8.6 (*)    Total Protein 5.9 (*)    GFR, Estimated 59 (*)    All other components within normal limits  CBC WITH DIFFERENTIAL/PLATELET - Abnormal; Notable for the following components:   RBC 3.67 (*)    Hemoglobin 11.5 (*)    HCT 34.0 (*)    All other components within normal limits  URINALYSIS, ROUTINE W REFLEX MICROSCOPIC    EKG EKG Interpretation  Date/Time:  Tuesday September 05 2021 21:06:56 EDT Ventricular Rate:  58 PR Interval:  244 QRS Duration: 66 QT Interval:  366 QTC Calculation: 359 R Axis:   -9 Text Interpretation: Sinus bradycardia with 1st degree A-V block with Premature atrial complexes Minimal voltage criteria for LVH, may be normal variant ( R in aVL ) Cannot rule out Anterior infarct , age undetermined Abnormal  ECG When compared with ECG of 25-Aug-2021 18:12, PREVIOUS ECG IS PRESENT Confirmed by Ross Marcus ([id number]) on 09/06/2021 5:33:45 AM  Radiology No results found.  Procedures Procedures    Medications Ordered in ED Medications  0.9 %  sodium chloride infusion (has no administration in time range)    ED Course/ Medical Decision Making/ A&P                           Medical Decision Making Risk Prescription drug management. Decision regarding hospitalization.   This patient presents to the ED for concern of abnormal lab, low sodium, this involves an extensive number of treatment options, and is a complaint that carries with it a high risk of complications and morbidity.  I considered the following differential and admission for this acute, potentially life threatening condition.  The differential diagnosis includes irratrogenic free water use, central DI, malnutrition  MDM:    This is a 88 year old male who presents with low sodium.  He is nontoxic.  Vital signs are largely reassuring.  He reports generalized weakness but his physical exam is reassuring.  Repeat sodium here is 127.  I had a long discussion with the patient and his daughter.  Feel he warrants an admission for gentle fluid resuscitation with isotonic fluids and repeat sodium.  He would be at high risk for dehydration given heat if he reduced his free water significantly.  He can have his sodium monitored closely.  He also does not want to precipitously elevate or drop his sodium given risk factors.  They are agreeable to plan  (Labs, imaging, consults)  Labs: I Ordered, and personally interpreted labs.  The pertinent results include: CBC, BMP  Imaging Studies ordered: I ordered imaging studies including none I independently visualized and interpreted imaging. I agree with the radiologist interpretation  Additional history obtained from daughter at bedside.  External records from outside source obtained and  reviewed including prior evaluations  Cardiac Monitoring: The patient was maintained on a cardiac monitor.  I personally viewed and interpreted the cardiac monitored which showed an underlying rhythm of: Normal sinus rhythm  Reevaluation: After the interventions noted above, I reevaluated the patient and found that they have :stayed the same  Social Determinants of Health: Lives with daughter  Disposition: Admit  Co morbidities that complicate the patient evaluation  Past Medical History:  Diagnosis Date   Diabetes mellitus without complication (HCC)    Kidney disease      Medicines Meds ordered this encounter  Medications   0.9 %  sodium chloride infusion    I have reviewed the patients home medicines and have made adjustments as needed  Problem List / ED Course: Problem List Items Addressed This Visit   None Visit Diagnoses     Hyponatremia    -  Primary                   Final Clinical Impression(s) / ED Diagnoses Final diagnoses:  Hyponatremia    Rx / DC Orders ED Discharge Orders     None         Shon Baton, MD 09/06/21 [phone]

## 2021-09-06 NOTE — ED Notes (Signed)
RN provided patient with a urinal. Patient states "I pee every two hours especially at night.".   Patient unable to urinate in urinal, per patient kept urinal with him because it is taking him a moment to go.   Denies pain.

## 2021-09-06 NOTE — ED Notes (Signed)
After admitting provider left room, patient daughter expresses concerns of blood pressure running high as it is unusual for the patient. Daughter also is requesting to look into his enlarged prostate.   MD made aware of family concerns.

## 2021-09-06 NOTE — ED Notes (Signed)
Pt received to ER 42. Pt resting on stretcher. Pt daughter at bedside. Pt on continuous monitoring. Call bell in reach.

## 2021-09-06 NOTE — H&P (Signed)
History and Physical    Paul Colon MEQ:[id number] DOB: 10-01-1929 DOA: 09/05/2021  PCP: [facility]  Patient coming from: Home  Chief Complaint: Generalized weakness  HPI: Paul Colon is a 88 y.o. male with medical history significant of type 2 diabetes, CKD stage IIIa presented to the ED for evaluation of generalized weakness and hyponatremia noted on outpatient labs.  Labs showing WBC 5.5, hemoglobin 11.5, MCV 92.6, platelet count 264k.  Sodium 127, potassium 4.5, chloride 91, bicarb 28, BUN 19, creatinine 1.1, glucose 130.  UA without signs of infection.  Chest x-ray showing left base atelectasis.  CT head without contrast negative for acute finding.  History provided by the patient and her daughter at bedside.  He is having generalized weakness for the past 1 week and had labs done at the [place] yesterday and told that his sodium was 125.  Patient denies vomiting or diarrhea.  Per daughter, his oral intake is good and eating balanced meals.  He has been living with her since March of this year after he had COVID infection.  She does mention that he drinks several cups of water throughout the day.  Patient repeatedly told me that he drinks a lot of water but is not able to quantify how much.  Tells me he goes to the bathroom frequently to urinate.  He does not take any diuretics at home.  He denies fevers, chills, cough, shortness of breath, chest pain, or abdominal pain.  Review of Systems:  Review of Systems  All other systems reviewed and are negative.   Past Medical History:  Diagnosis Date   Diabetes mellitus without complication (HCC)    Kidney disease     History reviewed. No pertinent surgical history.   reports that he has never smoked. He has never used smokeless tobacco. He reports that he does not drink alcohol and does not use drugs.  No Known Allergies  History reviewed. No pertinent family history.  Prior to Admission medications   Medication Sig Start  Date End Date Taking? Authorizing Provider  ketoconazole (NIZORAL) 2 % cream Apply 1 application. topically daily. 07/13/21   Vivi Barrack, DPM  metFORMIN (GLUCOPHAGE) 500 MG tablet Take 1 tablet (500 mg total) by mouth 2 (two) times daily with a meal. 04/23/21   Margarita Grizzle, MD    Physical Exam: Vitals:   09/05/21 2257 09/06/21 0146 09/06/21 0427 09/06/21 0530  BP: (!) 164/76 134/68 134/66 (!) 144/58  Pulse: (!) 58 (!) 59 (!) 56 (!) 56  Resp: 17 15 18    Temp: 97.6 F (36.4 C)  98.2 F (36.8 C)   TempSrc: Oral  Oral   SpO2: 99% 97% 99% 98%    Physical Exam Vitals reviewed.  Constitutional:      General: He is not in acute distress. HENT:     Head: Normocephalic and atraumatic.     Mouth/Throat:     Mouth: Mucous membranes are moist.  Eyes:     Extraocular Movements: Extraocular movements intact.  Cardiovascular:     Rate and Rhythm: Normal rate and regular rhythm.     Pulses: Normal pulses.  Pulmonary:     Effort: Pulmonary effort is normal. No respiratory distress.     Breath sounds: Normal breath sounds. No wheezing or rales.  Abdominal:     General: Bowel sounds are normal. There is no distension.     Palpations: Abdomen is soft.     Tenderness: There is no abdominal tenderness.  Musculoskeletal:  General: No swelling or tenderness.     Cervical back: Normal range of motion.  Skin:    General: Skin is warm and dry.  Neurological:     General: No focal deficit present.     Mental Status: He is alert and oriented to person, place, and time.     Cranial Nerves: No cranial nerve deficit.     Sensory: No sensory deficit.     Motor: No weakness.      Labs on Admission: I have personally reviewed following labs and imaging studies  CBC: Recent Labs  Lab 09/05/21 2058  WBC 5.5  NEUTROABS 2.9  HGB 11.5*  HCT 34.0*  MCV 92.6  PLT 264   Basic Metabolic Panel: Recent Labs  Lab 09/05/21 2058  NA 127*  K 4.5  CL 91*  CO2 28  GLUCOSE 130*  BUN  19  CREATININE 1.18  CALCIUM 8.6*   GFR: Estimated Creatinine Clearance: 41.4 mL/min (by C-G formula based on SCr of 1.18 mg/dL). Liver Function Tests: Recent Labs  Lab 09/05/21 2058  AST 16  ALT 12  ALKPHOS 49  BILITOT 0.4  PROT 5.9*  ALBUMIN 3.8   No results for input(s): "LIPASE", "AMYLASE" in the last 168 hours. No results for input(s): "AMMONIA" in the last 168 hours. Coagulation Profile: No results for input(s): "INR", "PROTIME" in the last 168 hours. Cardiac Enzymes: No results for input(s): "CKTOTAL", "CKMB", "CKMBINDEX", "TROPONINI" in the last 168 hours. BNP (last 3 results) No results for input(s): "PROBNP" in the last 8760 hours. HbA1C: No results for input(s): "HGBA1C" in the last 72 hours. CBG: No results for input(s): "GLUCAP" in the last 168 hours. Lipid Profile: No results for input(s): "CHOL", "HDL", "LDLCALC", "TRIG", "CHOLHDL", "LDLDIRECT" in the last 72 hours. Thyroid Function Tests: No results for input(s): "TSH", "T4TOTAL", "FREET4", "T3FREE", "THYROIDAB" in the last 72 hours. Anemia Panel: No results for input(s): "VITAMINB12", "FOLATE", "FERRITIN", "TIBC", "IRON", "RETICCTPCT" in the last 72 hours. Urine analysis:    Component Value Date/Time   COLORURINE YELLOW 09/05/2021 2119   APPEARANCEUR CLEAR 09/05/2021 2119   LABSPEC 1.012 09/05/2021 2119   PHURINE 7.0 09/05/2021 2119   GLUCOSEU NEGATIVE 09/05/2021 2119   HGBUR NEGATIVE 09/05/2021 2119   BILIRUBINUR NEGATIVE 09/05/2021 2119   KETONESUR NEGATIVE 09/05/2021 2119   PROTEINUR NEGATIVE 09/05/2021 2119   NITRITE NEGATIVE 09/05/2021 2119   LEUKOCYTESUR NEGATIVE 09/05/2021 2119    Radiological Exams on Admission: I have personally reviewed images No results found.  EKG: Independently reviewed.  Sinus rhythm with first-degree AV block, nonspecific T wave abnormality, baseline wander.  No significant change compared to prior tracings.  Assessment and Plan  Hyponatremia ?Excessive water  consumption.  Patient repeatedly told me that he drinks a lot of water and goes to the bathroom frequently to urinate.  He is not on any diuretics.  No vomiting or diarrhea.  Per daughter, his oral intake is good and eating balanced meals.  Sodium currently 127, reportedly 125 on labs done at the [place] yesterday.  Per review of chart, sodium was 126 on labs done 7/14 and previously 131 in March 2023. -He was started on IV fluids in the ED, will hold at this time and order stat labs to check serum osmolarity, urine sodium and osmolarity.  Will continue to monitor BMP every 4 hours. Goal rate of correction 4 to 6 mEq in 24 hours.  Generalized weakness Likely due to hyponatremia.  No infectious signs or symptoms.  CT head  negative for acute finding and no focal neurodeficit on exam. -Continue management of hyponatremia -PT/OT eval -Fall precautions  Type 2 diabetes -Check A1c -Sensitive sliding scale insulin ACHS  CKD stage IIIa Renal function stable. -Continue to monitor  Normocytic anemia Hemoglobin 11.5, was 12.4 on 7/14 and previously 13.3 in March 2023.  Patient is not endorsing any symptoms of GI bleed. -Check iron, ferritin, TIBC -FOBT  Elevated blood pressure readings Systolic currently in the 160s.  No documented history of hypertension. -Continue to monitor blood pressure closely and consider starting antihypertensive if blood pressure persistently elevated.  DVT prophylaxis: SCDs at this time, FOBT pending Code Status: Full Code (discussed with the patient) Family Communication: Daughter at bedside. Admission status: It is my clinical opinion that referral for OBSERVATION is reasonable and necessary in this patient based on the above information provided. The aforementioned taken together are felt to place the patient at high risk for further clinical deterioration. However, it is anticipated that the patient may be medically stable for discharge from the hospital within 24 to 48  hours.   Shela Leff MD [facility]  If 7PM-7AM, please contact night-coverage [email or web address]  09/06/2021, 5:59 AM

## 2021-09-06 NOTE — ED Notes (Addendum)
Pt transported to 5w20 at this time via stretcher.

## 2021-09-06 NOTE — Progress Notes (Signed)
PROGRESS NOTE  Paul Colon  DOB: [birth year]  PCP: [facility] HAL:[id number]  DOA: 09/05/2021  LOS: 0 days  Hospital Day: 2  Brief narrative: Paul Colon is a 88 y.o. male with PMH significant for DM2, CKD 3. Patient presented to the ED on 7/25 with complaint of generalized weakness, hyponatremia noted in outpatient labs. Patient reports generalized weakness for about a week despite having good oral intake and no vomiting or diarrhea.  Per patient's daughter, several weeks ago, he was hospitalized and told that he has chronic kidney disease and recommended to maintain hydration.  She states patient forgets to keep himself hydrated and hence he has been constantly reminding him to drink adequate water. Not on diuretics.    In the ED, she was afebrile, heart rate in 50s, blood pressure 142/69, breathing on room air Labs showed sodium level low at 127, renal function normal, liver enzymes normal, hemoglobin at 11.5 Chest x-ray showed left base atelectasis CT head without contrast negative for any acute finding.  Subjective: Patient was seen and examined this morning.  Elderly Caucasian male.  Lying on bed.  Not in distress.  Hard of hearing but alert, awake, knows he is in the hospital.  Daughter at bedside. Chart reviewed Remains bradycardic overnight, blood pressure elevated up to 160s   Assessment and plan: Acute on chronic hyponatremia -Sodium level was 131 in 3/23 and 126 two weeks prior to presentation. -Per history, patient was recommended to drink enough water and hence the family has been constantly watching him and reminding him to drink water.  He has diabetes mellitus but A1c acceptable for his age at 7.3.  Not on any diuretics at home. -Serum osmolality is low at 274, urine osmolality is low at 223.  Renal function is normal.  I believe patient needs consuming more water than he needs.  -For next 24 hours, will keep him on fluid restriction with 1200 mill of  water. -Continue to monitor BMP.Goal rate of correction 4 to 6 mEq in 24 hours. Recent Labs  Lab 09/05/21 2058 09/06/21 0853  NA 127* 127*   Generalized weakness -Likely secondary to hyponatremia.  No signs or symptoms of infection -No focal neurological deficit.  CT scan of head negative for any acute finding. -PT OT eval.  Fall precautions.  Type 2 diabetes mellitus -A1c acceptable at 7.3 for his age  -PTA on metformin 500 mg twice daily.  Currently on hold -Continue sliding-scale insulin with Accu-Cheks. Recent Labs  Lab 09/06/21 0737  GLUCAP 136*   Essential hypertension  -Blood pressure elevated to 160s this morning. -Not on any and hypertension at home. -Continue monitor blood pressure readings.  If remains elevated for next 24 hours despite fluid restriction, will consider losartan.  CKD stage IIIa -Renal function stable. -Continue to monitor Recent Labs    04/23/21 1837 08/25/21 1938 09/05/21 2058 09/06/21 0853  BUN 22 18 19 20   CREATININE 1.29* 1.18 1.18 1.05   Normocytic anemia -Hemoglobin 11.5, was 12.4 on 7/14 and previously 13.3 in March 2023.  Patient is not endorsing any symptoms of GI bleed.  Probably dilutional effect from excess water on him some. -Check anemia panel, FOBT Recent Labs    04/23/21 1837 08/25/21 1938 09/05/21 2058 09/06/21 0853  HGB 13.3 12.4* 11.5*  --   MCV 92.0 90.8 92.6  --   FERRITIN  --   --   --  307  TIBC  --   --   --  273  IRON  --   --   --  54   Goals of care   Code Status: Full Code    Mobility: PT eval pending  Skin assessment:     Nutritional status:  Body mass index is 21.62 kg/m.          Diet:  Diet Order             Diet Carb Modified Fluid consistency: Thin; Room service appropriate? Yes; Fluid restriction: 1200 mL Fluid  Diet effective now                   DVT prophylaxis:  Place and maintain sequential compression device Start: 09/06/21 0646   Antimicrobials: None Fluid:  None Consultants: None Family Communication: Daughter [name] at bedside  Status is: Observation  Continue in-hospital care because: Need sodium level monitoring Level of care: Telemetry Medical   Dispo: The patient is from: Home              Anticipated d/c is to: Pending clinical course              Patient currently is not medically stable to d/c.   Difficult to place patient No     Infusions:    Scheduled Meds:  insulin aspart  0-5 Units Subcutaneous QHS   insulin aspart  0-9 Units Subcutaneous TID WC    PRN meds: acetaminophen **OR** acetaminophen   Antimicrobials: Anti-infectives (From admission, onward)    None       Objective: Vitals:   09/06/21 1000 09/06/21 1100  BP: (!) 112/49 (!) 143/83  Pulse: 62 (!) 54  Resp: 15 10  Temp:    SpO2: 98% 99%   No intake or output data in the 24 hours ending 09/06/21 1143 Filed Weights   09/06/21 0945  Weight: 70.3 kg   Weight change:  Body mass index is 21.62 kg/m.   Physical Exam: General exam: Pleasant, elderly Caucasian male.  Not in physical distress Skin: No rashes, lesions or ulcers. HEENT: Atraumatic, normocephalic, no obvious bleeding Lungs: Clear to auscultation bilaterally CVS: Regular rate and rhythm, no murmur GI/Abd soft, nontender, nondistended, bowel sound present CNS: Alert, awake, oriented to place and person, hard of hearing at baseline Psychiatry: Mood appropriate Extremities: No pedal edema, no calf tenderness  Data Review: I have personally reviewed the laboratory data and studies available.  F/u labs ordered Unresulted Labs (From admission, onward)     Start     Ordered   09/06/21 0649  Occult blood card to lab, stool  Once,   R        09/06/21 0649   09/06/21 2440  Basic metabolic panel  Now then every 4 hours,   R (with TIMED occurrences)      09/06/21 1027            Signed, Lorin Glass, MD [facility] 09/06/2021

## 2021-09-06 NOTE — ED Notes (Signed)
ED Provider at bedside. 

## 2021-09-06 NOTE — ED Notes (Signed)
Dr. Dahal at bedside. 

## 2021-09-06 NOTE — H&P (Signed)
[facility] notification # [id number]

## 2021-09-06 NOTE — ED Notes (Signed)
Admitting provider at bedside.

## 2021-09-06 NOTE — Evaluation (Signed)
Physical Therapy Evaluation Patient Details Name: Paul Colon MRN: [id number] DOB: [birth year] Today's Date: 09/06/2021  History of Present Illness  88 yo male was admitted on 7/25 for concerns of weakness and reduced mobility.  Pt is in hyponatremia, hypertensive.  PMHx:  DM, CKD3a, HTN, anemia,  Clinical Impression  Pt was seen for initiation of mobility and included transfers to mult surfaces and navigation in confined space.  He is typically at home with daughter working, and has been able to navigate on [employer] per his report.  Pt was demonstrating issues of awareness of his environment, including being unaware that he had a foley cath and was on mult lines.  Will recommend PT to work on strengthening and improving balance control to get him to more stable mobility, including use of RW with more automatic function.  He is on OBS status admission but would recommend SNF to manage his care needs with frequent observations of safety and his tendency to try to get up alone.  Follow acutely for goals of PT with focus on balance and sequencing all transitions.  Instruct safety repetitively to try to reinforce with pt.       Recommendations for follow up therapy are one component of a multi-disciplinary discharge planning process, led by the attending physician.  Recommendations may be updated based on patient status, additional functional criteria and insurance authorization.  Follow Up Recommendations Skilled nursing-short term rehab (<3 hours/day) Can patient physically be transported by private vehicle: No    Assistance Recommended at Discharge Frequent or constant Supervision/Assistance  Patient can return home with the following  A lot of help with walking and/or transfers;A lot of help with bathing/dressing/bathroom;Assistance with cooking/housework;Direct supervision/assist for medications management;Direct supervision/assist for financial management;Assist for transportation;Help with stairs or  ramp for entrance    Equipment Recommendations Rolling walker (2 wheels)  Recommendations for Other Services       Functional Status Assessment Patient has had a recent decline in their functional status and demonstrates the ability to make significant improvements in function in a reasonable and predictable amount of time.     Precautions / Restrictions Precautions Precautions: Fall Precaution Comments: foley cath, telemetry Restrictions Weight Bearing Restrictions: No      Mobility  Bed Mobility Overal bed mobility: Needs Assistance Bed Mobility: Supine to Sit, Sit to Supine     Supine to sit: Mod assist Sit to supine: Mod assist   General bed mobility comments: mod assist for trunk to get up and mod for legs to return to bed    Transfers Overall transfer level: Needs assistance Equipment used: 1 person hand held assist Transfers: Sit to/from Stand, Bed to chair/wheelchair/BSC Sit to Stand: Mod assist   Step pivot transfers: Mod assist       General transfer comment: pt moves slowly and is hindered by both strength and hearing    Ambulation/Gait Ambulation/Gait assistance: Min assist Gait Distance (Feet): 30 Feet Assistive device: Rolling walker (2 wheels), 1 person hand held assist Gait Pattern/deviations: Decreased stride length, Step-through pattern, Wide base of support, Trunk flexed Gait velocity: reduced Gait velocity interpretation: <1.31 ft/sec, indicative of household ambulator Pre-gait activities: balance ck General Gait Details: pt is generally able to maneuver but struggling with turns and remembering to push walker instead of lifting to step  Stairs            Wheelchair Mobility    Modified Rankin (Stroke Patients Only)       Balance Overall balance assessment: Needs  assistance Sitting-balance support: Feet supported Sitting balance-Leahy Scale: Fair     Standing balance support: Bilateral upper extremity supported, During  functional activity Standing balance-Leahy Scale: Poor                               Pertinent Vitals/Pain Pain Assessment Pain Assessment: No/denies pain    Home Living Family/patient expects to be discharged to:: Private residence Living Arrangements: Children Available Help at Discharge: Family;Available PRN/intermittently Type of Home: House Home Access: Stairs to enter Entrance Stairs-Rails: Can reach both Entrance Stairs-Number of Steps: 2 Alternate Level Stairs-Number of Steps: flight Home Layout: Two level;Able to live on main level with bedroom/bathroom;Other (Comment) (lives on ground floor but per pt sleeping on couch, showers upstairs once a week) Home Equipment: Rollator (4 wheels);Cane - single point Additional Comments: pt is up at night on [facility] for frequent trips to [facility]    Prior Function Prior Level of Function : Needs assist       Physical Assist : Mobility (physical) Mobility (physical): Gait   Mobility Comments: SPC with independence ADLs Comments: family helps him bathe and dress     Hand Dominance   Dominant Hand: Right    Extremity/Trunk Assessment   Upper Extremity Assessment Upper Extremity Assessment: Generalized weakness    Lower Extremity Assessment Lower Extremity Assessment: Generalized weakness    Cervical / Trunk Assessment Cervical / Trunk Assessment: Kyphotic  Communication   Communication: HOH  Cognition Arousal/Alertness: Awake/alert Behavior During Therapy: Impulsive Overall Cognitive Status: No family/caregiver present to determine baseline cognitive functioning                                 General Comments: unclear how close to baseline he is        General Comments General comments (skin integrity, edema, etc.): Pt is working hard to get up to [facility], did not succeed in having bowel movement but also needs help to wipe afterward    Exercises     Assessment/Plan    PT Assessment Patient  needs continued PT services  PT Problem List Decreased strength;Decreased activity tolerance;Decreased balance;Decreased mobility;Decreased coordination;Decreased cognition;Decreased knowledge of use of DME;Decreased safety awareness       PT Treatment Interventions DME instruction;Gait training;Functional mobility training;Therapeutic activities;Therapeutic exercise;Balance training;Neuromuscular re-education;Patient/family education    PT Goals (Current goals can be found in the Care Plan section)  Acute Rehab PT Goals Patient Stated Goal: none stated PT Goal Formulation: Patient unable to participate in goal setting Time For Goal Achievement: 09/20/21 Potential to Achieve Goals: Good    Frequency Min 2X/week     Co-evaluation               AM-PAC PT "6 Clicks" Mobility  Outcome Measure Help needed turning from your back to your side while in a flat bed without using bedrails?: None Help needed moving from lying on your back to sitting on the side of a flat bed without using bedrails?: A Little Help needed moving to and from a bed to a chair (including a wheelchair)?: A Little Help needed standing up from a chair using your arms (e.g., wheelchair or bedside chair)?: A Lot Help needed to walk in hospital room?: A Little Help needed climbing 3-5 steps with a railing? : Total 6 Click Score: 16    End of Session Equipment Utilized During Treatment: Gait belt  Activity Tolerance: Patient limited by fatigue Patient left: in bed;with call bell/phone within reach;with bed alarm set Nurse Communication: Mobility status PT Visit Diagnosis: Unsteadiness on feet (R26.81);Muscle weakness (generalized) (M62.81);Difficulty in walking, not elsewhere classified (R26.2);Adult, failure to thrive (R62.7)    Time: 2671-2458 PT Time Calculation (min) (ACUTE ONLY): 33 min   Charges:   PT Evaluation $PT Eval Moderate Complexity: 1 Mod PT Treatments $Gait Training: 8-22 mins       Ivar Drape 09/06/2021, 4:55 PM  Samul Dada, PT PhD Acute Rehab Dept. Number: [facility] [id number] and [facility] [phone]

## 2021-09-06 NOTE — Plan of Care (Signed)
  Problem: Coping: Goal: Ability to adjust to condition or change in health will improve Outcome: Progressing   Problem: Fluid Volume: Goal: Ability to maintain a balanced intake and output will improve Outcome: Progressing   Problem: Health Behavior/Discharge Planning: Goal: Ability to identify and utilize available resources and services will improve Outcome: Progressing Goal: Ability to manage health-related needs will improve Outcome: Progressing   Problem: Metabolic: Goal: Ability to maintain appropriate glucose levels will improve Outcome: Progressing   Problem: Nutritional: Goal: Maintenance of adequate nutrition will improve Outcome: Progressing Goal: Progress toward achieving an optimal weight will improve Outcome: Progressing   Problem: Skin Integrity: Goal: Risk for impaired skin integrity will decrease Outcome: Progressing   

## 2021-09-06 NOTE — ED Notes (Signed)
Pt IVF stopped at this time per dr. Lorain Childes orders

## 2021-09-07 DIAGNOSIS — E871 Hypo-osmolality and hyponatremia: Secondary | ICD-10-CM | POA: Diagnosis not present

## 2021-09-07 LAB — BASIC METABOLIC PANEL
Anion gap: 9 (ref 5–15)
BUN: 18 mg/dL (ref 8–23)
CO2: 26 mmol/L (ref 22–32)
Calcium: 9 mg/dL (ref 8.9–10.3)
Chloride: 94 mmol/L — ABNORMAL LOW (ref 98–111)
Creatinine, Ser: 1.06 mg/dL (ref 0.61–1.24)
GFR, Estimated: >60 mL/min (ref >60)
Glucose, Bld: 140 mg/dL — ABNORMAL HIGH (ref 70–99)
Potassium: 4.5 mmol/L (ref 3.5–5.1)
Sodium: 129 mmol/L — ABNORMAL LOW (ref 135–145)

## 2021-09-07 LAB — GLUCOSE, CAPILLARY
Glucose-Capillary: 129 mg/dL — ABNORMAL HIGH (ref 70–99)
Glucose-Capillary: 67 mg/dL — ABNORMAL LOW (ref 70–99)
Glucose-Capillary: 71 mg/dL (ref 70–99)

## 2021-09-07 NOTE — Evaluation (Signed)
Occupational Therapy Evaluation Patient Details Name: Paul Colon MRN: [id number] DOB: Oct 01, 1929 Today's Date: 09/07/2021   History of Present Illness 88 yo male was admitted on 7/25 for concerns of weakness and reduced mobility.  Pt is in hyponatremia, hypertensive.  PMHx:  DM, CKD3a, HTN, anemia,   Clinical Impression   Pt is functioning at or near his baseline. Ambulated with his home rollator in halls with supervision for lines and safety. Daughter is declining HH services. Family is with pt at all times at home. No further OT needs.      Recommendations for follow up therapy are one component of a multi-disciplinary discharge planning process, led by the attending physician.  Recommendations may be updated based on patient status, additional functional criteria and insurance authorization.   Follow Up Recommendations  No OT follow up    Assistance Recommended at Discharge Frequent or constant Supervision/Assistance  Patient can return home with the following A little help with bathing/dressing/bathroom    Functional Status Assessment  Patient has had a recent decline in their functional status and demonstrates the ability to make significant improvements in function in a reasonable and predictable amount of time.  Equipment Recommendations  None recommended by OT    Recommendations for Other Services       Precautions / Restrictions Precautions Precautions: Fall Restrictions Weight Bearing Restrictions: No      Mobility Bed Mobility               General bed mobility comments: in chair    Transfers Overall transfer level: Needs assistance Equipment used: Rollator (4 wheels)   Sit to Stand: Supervision           General transfer comment: supervised for lines      Balance Overall balance assessment: Needs assistance   Sitting balance-Leahy Scale: Good     Standing balance support: No upper extremity supported Standing balance-Leahy Scale:  Fair Standing balance comment: used urinal in standing without LOB                           ADL either performed or assessed with clinical judgement   ADL Overall ADL's : At baseline                                             Vision Baseline Vision/History: 1 Wears glasses Ability to See in Adequate Light: 0 Adequate Patient Visual Report: No change from baseline       Perception     Praxis      Pertinent Vitals/Pain Pain Assessment Pain Assessment: No/denies pain     Hand Dominance Right   Extremity/Trunk Assessment Upper Extremity Assessment Upper Extremity Assessment: RUE deficits/detail RUE Deficits / Details: tremor, educated in compensatory strategies when eating and writing RUE Coordination: decreased fine motor   Lower Extremity Assessment Lower Extremity Assessment: Defer to PT evaluation   Cervical / Trunk Assessment Cervical / Trunk Assessment: Kyphotic   Communication Communication Communication: HOH   Cognition Arousal/Alertness: Awake/alert Behavior During Therapy: Impulsive Overall Cognitive Status: History of cognitive impairments - at baseline                                 General Comments: daughter endorses STM deficits at baseline  General Comments       Exercises     Shoulder Instructions      Home Living Family/patient expects to be discharged to:: Private residence Living Arrangements: Children Available Help at Discharge: Family;Available 24 hours/day Type of Home: House Home Access: Stairs to enter [employer] of Steps: 2 Entrance Stairs-Rails: Can reach both Home Layout: Two level;Able to live on main level with bedroom/bathroom;Other (Comment) Alternate Level Stairs-Number of Steps: flight Alternate Level Stairs-Rails: Left Bathroom Shower/Tub: Chief Strategy Officer: Standard     Home Equipment: Rollator (4 wheels);Cane - single point    Additional Comments: pt is up at night on [facility] for frequent trips to [facility]      Prior Functioning/Environment Prior Level of Function : Needs assist             Mobility Comments: SPC with independence or rollator ADLs Comments: family helps him bathe and with IADLs        OT Problem List:        OT Treatment/Interventions:      OT Goals(Current goals can be found in the care plan section)    OT Frequency:      Co-evaluation              AM-PAC OT "6 Clicks" Daily Activity     Outcome Measure Help from another person eating meals?: None Help from another person taking care of personal grooming?: A Little Help from another person toileting, which includes using toliet, bedpan, or urinal?: A Little Help from another person bathing (including washing, rinsing, drying)?: A Little Help from another person to put on and taking off regular upper body clothing?: None Help from another person to put on and taking off regular lower body clothing?: A Little 6 Click Score: 20   End of Session Nurse Communication: Mobility status  Activity Tolerance: Patient tolerated treatment well Patient left: in chair;with call bell/phone within reach;with family/visitor present  OT Visit Diagnosis: Other abnormalities of gait and mobility (R26.89)                Time: 4403-4742 OT Time Calculation (min): 33 min Charges:  OT General Charges $OT Visit: 1 Visit OT Evaluation $OT Eval Low Complexity: 1 Low  Berna Spare, OTR/L Acute Rehabilitation Services Office: [phone]   Evern Bio 09/07/2021, 10:03 AM

## 2021-09-07 NOTE — Discharge Summary (Signed)
Physician Discharge Summary  Paul Colon IEP:[id number] DOB: 1929-10-19 DOA: 09/05/2021  PCP: [facility]  Admit date: 09/05/2021 Discharge date: 09/07/2021  Admitted From: Home Discharge disposition: Home  Recommendations at discharge:  Recommend fluid restriction to per day for next 1 week. Plz repeat a BMP with PCP early next week. Contact information of nephrology service for outpatient evaluation given.  Brief narrative: Paul Colon is a 88 y.o. male with PMH significant for DM2, CKD 3. Patient presented to the ED on 7/25 with complaint of generalized weakness, hyponatremia noted in outpatient labs. Patient reports generalized weakness for about a week despite having good oral intake and no vomiting or diarrhea.  Per patient's daughter, several weeks ago, he was hospitalized and told that he has chronic kidney disease and recommended to maintain hydration.  She states patient forgets to keep himself hydrated and hence he has been constantly reminding him to drink adequate water. Not on diuretics.    In the ED, she was afebrile, heart rate in 50s, blood pressure 142/69, breathing on room air Labs showed sodium level low at 127, renal function normal, liver enzymes normal, hemoglobin at 11.5 Chest x-ray showed left base atelectasis CT head without contrast negative for any acute finding.  Subjective: Patient was seen and examined this morning.  Elderly Caucasian male.  Sitting up in bed.  Not in distress. Patient is upset about fluid, calorie restriction in the hospital.  Wants to go home Daughter at bedside. We discussed about his sodium trend, continuation of the current plan post discharge and need to follow-up as an outpatient.  Hospital course: Acute on chronic hyponatremia -Sodium level was 131 in 3/23 and 126 two weeks prior to presentation. -Per history, patient was recommended to drink enough water and hence the family has been constantly watching him  and reminding him to drink water.  He has diabetes mellitus but A1c acceptable for his age at 7.3.  Not on any diuretics at home. -Serum osmolality is low at 274, urine osmolality is low at 223.  Renal function is normal.  I believe patient has been consuming more water than he needs.  -Patient was placed on fluid restriction to 1200 mill per day -Sodium level gradually up, underwent 1 and this morning. -I think it is reasonable to discharge patient to continue fluid restriction at home and repeat BMP early next week.  I have also given the contact information of nephrologist just in case sodium level drops again Recent Labs  Lab 09/05/21 2058 09/06/21 0853 09/06/21 1428 09/07/21 0839  NA 127* 127* 130* 129*   Generalized weakness -Likely secondary to hyponatremia.  No signs or symptoms of infection -No focal neurological deficit.  CT scan of head negative for any acute finding. -PT OT eval.  Fall precautions.  Type 2 diabetes mellitus -A1c acceptable at 7.3 for his age  -PTA on metformin 500 mg twice daily.  Continue the same. Recent Labs  Lab 09/06/21 1701 09/06/21 2058 09/07/21 0828 09/07/21 1230 09/07/21 1232  GLUCAP 169* 181* 129* 67* 71   Essential hypertension  -Not on any antihypertensives at home.  Blood pressure mostly less than 150 systolic.  CKD stage IIIa -Renal function stable. -Continue to monitor Recent Labs    04/23/21 1837 08/25/21 1938 09/05/21 2058 09/06/21 0853 09/06/21 1428 09/07/21 0839  BUN 22 18 19 20 17 18   CREATININE 1.29* 1.18 1.18 1.05 1.12 1.06   Normocytic anemia -Hemoglobin 11.5, was 12.4 on 7/14 and previously 13.3  in March 2023.  Patient is not endorsing any symptoms of GI bleed.  Probably dilutional effect from excess water on him some. Recent Labs    04/23/21 1837 08/25/21 1938 09/05/21 2058 09/06/21 0853  HGB 13.3 12.4* 11.5*  --   MCV 92.0 90.8 92.6  --   FERRITIN  --   --   --  307  TIBC  --   --   --  273  IRON  --    --   --  54   Wounds:  -    Discharge Exam:   Vitals:   09/06/21 2000 09/07/21 0340 09/07/21 0800 09/07/21 1228  BP:  126/72 120/65 (!) 142/65  Pulse:  65 60 (!) 56  Resp:  16 18 15   Temp: 98.3 F (36.8 C) 97.8 F (36.6 C)  97.9 F (36.6 C)  TempSrc: Oral Oral  Oral  SpO2:  95% 95%   Weight:      Height:        Body mass index is 21.62 kg/m.  General exam: Pleasant, elderly Caucasian male.  Not in physical distress Skin: No rashes, lesions or ulcers. HEENT: Atraumatic, normocephalic, no obvious bleeding Lungs: Clear to auscultation bilaterally CVS: Regular rate and rhythm, no murmur GI/Abd soft, nontender, nondistended, bowel sound present CNS: Alert, awake, oriented to place, person and time. Hard of hearing at baseline Psychiatry: Mood appropriate Extremities: No pedal edema, no calf tenderness  Follow ups:    Follow-up Information     [facility]   Contact information: [place] [address] [id number]         Donato Heinz, MD Follow up.   Specialty: Nephrology Contact information: [place] [address] [phone]                 Discharge Instructions:   Discharge Instructions     Call MD for:  difficulty breathing, headache or visual disturbances   Complete by: As directed    Call MD for:  extreme fatigue   Complete by: As directed    Call MD for:  hives   Complete by: As directed    Call MD for:  persistant dizziness or light-headedness   Complete by: As directed    Call MD for:  persistant nausea and vomiting   Complete by: As directed    Call MD for:  severe uncontrolled pain   Complete by: As directed    Call MD for:  temperature >100.4   Complete by: As directed    Diet Carb Modified   Complete by: As directed    Diet general   Complete by: As directed    1500 ml fluid a day   Discharge instructions   Complete by: As directed     Recommendations at discharge:   Recommend fluid restriction to 1546ml for next 1 week. Plz repeat a BMP with PCP early next week. Contact information of nephrology service for outpatient evaluation given.  General discharge instructions: Follow with [facility] in 7 days  Please request your PCP  to go over your hospital tests, procedures, radiology results at the follow up. Please get your medicines reviewed and adjusted.  Your PCP may decide to repeat certain labs or tests as needed. Do not drive, operate heavy machinery, perform activities at heights, swimming or participation in water activities or provide baby sitting services if your were admitted for syncope or siezures until you have  seen by Primary MD or a Neurologist and advised to do so again. [place] Controlled Substance Reporting System database was reviewed. Do not drive, operate heavy machinery, perform activities at heights, swim, participate in water activities or provide baby-sitting services while on medications for pain, sleep and mood until your outpatient physician has reevaluated you and advised to do so again.  You are strongly recommended to comply with the dose, frequency and duration of prescribed medications. Activity: As tolerated with Full fall precautions use walker/cane & assistance as needed Avoid using any recreational substances like cigarette, tobacco, alcohol, or non-prescribed drug. If you experience worsening of your admission symptoms, develop shortness of breath, life threatening emergency, suicidal or homicidal thoughts you must seek medical attention immediately by calling 911 or calling your MD immediately  if symptoms less severe. You must read complete instructions/literature along with all the possible adverse reactions/side effects for all the medicines you take and that have been prescribed to you. Take any new medicine only after you have completely understood and accepted  all the possible adverse reactions/side effects.  Wear Seat belts while driving. You were cared for by a hospitalist during your hospital stay. If you have any questions about your discharge medications or the care you received while you were in the hospital after you are discharged, you can call the unit and ask to speak with the hospitalist or the covering physician. Once you are discharged, your primary care physician will handle any further medical issues. Please note that NO REFILLS for any discharge medications will be authorized once you are discharged, as it is imperative that you return to your primary care physician (or establish a relationship with a primary care physician if you do not have one).   Increase activity slowly   Complete by: As directed        Discharge Medications:   Allergies as of 09/07/2021       Reactions   Alogliptin Other (See Comments)   Unknown reaction    Penicillin G Rash        Medication List     STOP taking these medications    ketoconazole 2 % cream Commonly known as: NIZORAL   Paxlovid (150/100) 10 x 150 MG & 10 x 100MG  Tbpk Generic drug: nirmatrelvir & ritonavir       TAKE these medications    dorzolamide-timolol 22.3-6.8 MG/ML ophthalmic solution Commonly known as: COSOPT Place 1 drop into both eyes 2 (two) times daily.   metFORMIN 500 MG tablet Commonly known as: GLUCOPHAGE Take 1 tablet (500 mg total) by mouth 2 (two) times daily with a meal.   ONE-A-DAY MENS 50+ PO Take 1 tablet by mouth in the morning.   SAW PALMETTO PO Take 2 capsules by mouth in the morning.   tamsulosin 0.4 MG Caps capsule Commonly known as: FLOMAX Take 0.4 mg by mouth daily after supper.         The results of significant diagnostics from this hospitalization (including imaging, microbiology, ancillary and laboratory) are listed below for reference.    Procedures and Diagnostic Studies:   No results found.   Labs:   Basic Metabolic  Panel: Recent Labs  Lab 09/05/21 2058 09/06/21 0853 09/06/21 1428 09/07/21 0839  NA 127* 127* 130* 129*  K 4.5 3.9 4.4 4.5  CL 91* 95* 95* 94*  CO2 28 23 29 26   GLUCOSE 130* 181* 156* 140*  BUN 19 20 17 18   CREATININE 1.18 1.05 1.12 1.06  CALCIUM 8.6* 8.7* 8.6* 9.0   GFR Estimated Creatinine Clearance: 46.1 mL/min (by C-G formula based on SCr of 1.06 mg/dL). Liver Function Tests: Recent Labs  Lab 09/05/21 2058  AST 16  ALT 12  ALKPHOS 49  BILITOT 0.4  PROT 5.9*  ALBUMIN 3.8   No results for input(s): "LIPASE", "AMYLASE" in the last 168 hours. No results for input(s): "AMMONIA" in the last 168 hours. Coagulation profile No results for input(s): "INR", "PROTIME" in the last 168 hours.  CBC: Recent Labs  Lab 09/05/21 2058  WBC 5.5  NEUTROABS 2.9  HGB 11.5*  HCT 34.0*  MCV 92.6  PLT 264   Cardiac Enzymes: No results for input(s): "CKTOTAL", "CKMB", "CKMBINDEX", "TROPONINI" in the last 168 hours. BNP: Invalid input(s): "POCBNP" CBG: Recent Labs  Lab 09/06/21 1701 09/06/21 2058 09/07/21 0828 09/07/21 1230 09/07/21 1232  GLUCAP 169* 181* 129* 67* 71   D-Dimer No results for input(s): "DDIMER" in the last 72 hours. Hgb A1c Recent Labs    09/06/21 0628  HGBA1C 7.3*   Lipid Profile No results for input(s): "CHOL", "HDL", "LDLCALC", "TRIG", "CHOLHDL", "LDLDIRECT" in the last 72 hours. Thyroid function studies No results for input(s): "TSH", "T4TOTAL", "T3FREE", "THYROIDAB" in the last 72 hours.  Invalid input(s): "FREET3" Anemia work up Recent Labs    09/06/21 0853  FERRITIN 307  TIBC 273  IRON 54   Microbiology No results found for this or any previous visit (from the past 240 hour(s)).  Time coordinating discharge: 35 minutes  Signed: Chason Colon  [facility] 09/07/2021, 2:42 PM

## 2021-09-07 NOTE — TOC Initial Note (Signed)
Transition of Care [facility]) - Initial/Assessment Note    Patient Details  Name: Paul Colon MRN: [id number] Date of Birth: Jun 30, 1929  Transition of [facility]) CM/SW Contact:    Mearl Latin, LCSW Phone Number: 09/07/2021, 9:13 AM  Clinical Narrative:                 CSW received consult for possible SNF at time of discharge. CSW spoke with patient's daughter. She reported that patient lives with her and normally walks the length of [address] and [employer] with her (patient also confirms this). She does not feel that he needs SNF at this time and has just not felt as strong due to his medical condition. Patient goes to the [place] for his care. He has also received exercises to do at home from the [place] that he does daily. Patient has a rolling walker at home. Daughter will transport home.    Expected Discharge Plan: Home/Self Care Barriers to Discharge: No Barriers Identified   Patient Goals and CMS Choice Patient states their goals for this hospitalization and ongoing recovery are:: Return home CMS Medicare.gov Compare Post Acute Care list provided to:: Patient Represenative (must comment) Choice offered to / list presented to : Patient, Adult Children  Expected Discharge Plan and Services Expected Discharge Plan: Home/Self Care In-house Referral: Clinical Social Work     Living arrangements for the past 2 months: Single Family Home                                      Prior Living Arrangements/Services Living arrangements for the past 2 months: Single Family Home Lives with:: Adult Children Patient language and need for interpreter reviewed:: Yes Do you feel safe going back to the place where you live?: Yes      Need for Family Participation in Patient Care: Yes (Comment) Care giver support system in place?: Yes (comment) Current home services: DME (Rolling walker) Criminal Activity/Legal Involvement Pertinent to Current Situation/Hospitalization: No - Comment as  needed  Activities of Daily Living Home Assistive Devices/Equipment: None ADL Screening (condition at time of admission) Patient's cognitive ability adequate to safely complete daily activities?: Yes Is the patient deaf or have difficulty hearing?: Yes Does the patient have difficulty seeing, even when wearing glasses/contacts?: Yes Does the patient have difficulty concentrating, remembering, or making decisions?: No Patient able to express need for assistance with ADLs?: Yes Does the patient have difficulty dressing or bathing?: No Independently performs ADLs?: Yes (appropriate for developmental age) Does the patient have difficulty walking or climbing stairs?: No Weakness of Legs: Both Weakness of Arms/Hands: None  Permission Sought/Granted Permission sought to share information with : Family Supports Permission granted to share information with : Yes, Verbal Permission Granted  Share Information with NAME: [name]     Permission granted to share info w Relationship: Daughter  Permission granted to share info w Contact Information: [phone]  Emotional Assessment Appearance:: Appears stated age Attitude/Demeanor/Rapport: Engaged Affect (typically observed): Accepting, Appropriate, Pleasant Orientation: : Oriented to Self, Oriented to Place, Oriented to Situation Alcohol / Substance Use: Not Applicable Psych Involvement: No (comment)  Admission diagnosis:  Hyponatremia [E87.1] Patient Active Problem List   Diagnosis Date Noted   Hyponatremia 09/06/2021   Generalized weakness 09/06/2021   Type 2 diabetes mellitus (HCC) 09/06/2021   Chronic kidney disease, stage 3a (HCC) 09/06/2021   Normocytic anemia 09/06/2021   PCP:  [facility]   [id number] [place], [place] - [address] [place] [address] Phone: [phone] Fax: [phone]     Social Determinants of Health (SDOH) Interventions    Readmission Risk  Interventions     No data to display

## 2021-09-14 ENCOUNTER — Ambulatory Visit (INDEPENDENT_AMBULATORY_CARE_PROVIDER_SITE_OTHER): Payer: No Typology Code available for payment source

## 2021-09-14 DIAGNOSIS — M2041 Other hammer toe(s) (acquired), right foot: Secondary | ICD-10-CM | POA: Diagnosis not present

## 2021-09-14 DIAGNOSIS — M2042 Other hammer toe(s) (acquired), left foot: Secondary | ICD-10-CM

## 2021-09-14 DIAGNOSIS — E1149 Type 2 diabetes mellitus with other diabetic neurological complication: Secondary | ICD-10-CM

## 2021-09-14 NOTE — Progress Notes (Signed)
Patient presents today to pick up diabetic shoes and insoles.  Patient was dispensed 1 pair of diabetic shoes and 3 pairs of foam casted diabetic insoles. Fit was satisfactory. Instructions for break-in and wear was reviewed and a copy was given to the patient.   Re-appointment for regularly scheduled diabetic foot care visits or if they should experience any trouble with the shoes or insoles.  

## 2021-09-26 DIAGNOSIS — I951 Orthostatic hypotension: Secondary | ICD-10-CM | POA: Diagnosis not present

## 2021-09-26 DIAGNOSIS — N3000 Acute cystitis without hematuria: Secondary | ICD-10-CM | POA: Diagnosis not present

## 2021-09-26 DIAGNOSIS — E1165 Type 2 diabetes mellitus with hyperglycemia: Secondary | ICD-10-CM | POA: Diagnosis not present

## 2021-11-01 ENCOUNTER — Encounter: Payer: Self-pay | Admitting: Neurology

## 2021-11-06 NOTE — Progress Notes (Deleted)
NEUROLOGY CONSULTATION NOTE  Paul Colon MRN: [id number] DOB: 09-24-1929  Referring provider: Posey Pronto, PA-C Primary care provider: Posey Pronto, PA-C  Reason for consult:  ***  Assessment/Plan:   ***   Subjective:  Paul Colon is a 88 year old male with DM II and kidney disease who presents for TIA.  History supplemented [facility] and [facility].  On 09/26/2021, he was observed by his daughter to have upward gaze with slurred speech and difficulty getting words out.  Symptoms lasted a few minutes but afterwards he was cold, clammy and fatigued.  He laid down to rest.  Later that day, he presented to physical therapy and was found to have orthostatic hypotension.  He went to ED for evaluation of TIA.  CT head showed no acute stroke, EKG unremarkable, Na low at 130, glucose elevated at 246. UA was positive for WBCs and LE but not treated as he was asymptomatic.  He later had a follow up MRI of the brain on 10/24/2021 which demonstrated mild chronic small vessel ischemic changes as well as generalized atrophy with asymmetric volume loss of the right hippocampus and possible disproportionate midbrain atrophy but no evidence of stroke.    Recent labs:  Hgb A1c 7.7, LDL 111     PAST MEDICAL HISTORY: Past Medical History:  Diagnosis Date   Diabetes mellitus without complication ([place])    Kidney disease     PAST SURGICAL HISTORY: No past surgical history on file.  MEDICATIONS: Current Outpatient Medications on File Prior to Visit  Medication Sig Dispense Refill   dorzolamide-timolol (COSOPT) 22.3-6.8 MG/ML ophthalmic solution Place 1 drop into both eyes 2 (two) times daily.     metFORMIN (GLUCOPHAGE) 500 MG tablet Take 1 tablet (500 mg total) by mouth 2 (two) times daily with a meal. 30 tablet 0   Multiple Vitamins-Minerals (ONE-A-DAY MENS 50+ PO) Take 1 tablet by mouth in the morning.     Saw Palmetto, Serenoa repens, (SAW PALMETTO PO) Take 2 capsules by mouth in the  morning.     tamsulosin (FLOMAX) 0.4 MG CAPS capsule Take 0.4 mg by mouth daily after supper.     No current facility-administered medications on file prior to visit.    ALLERGIES: Allergies  Allergen Reactions   Alogliptin Other (See Comments)    Unknown reaction    Penicillin G Rash    FAMILY HISTORY: No family history on file.  Objective:  *** General: No acute distress.  Patient appears well-groomed.   Head:  Normocephalic/atraumatic Eyes:  fundi examined but not visualized Neck: supple, no paraspinal tenderness, full range of motion Back: No paraspinal tenderness Heart: regular rate and rhythm Lungs: Clear to auscultation bilaterally. Vascular: No carotid bruits. Neurological Exam: Mental status: alert and oriented to person, place, and time, speech fluent and not dysarthric, language intact. Cranial nerves: CN I: not tested CN II: pupils equal, round and reactive to light, visual fields intact CN III, IV, VI:  full range of motion, no nystagmus, no ptosis CN V: facial sensation intact. CN VII: upper and lower face symmetric CN VIII: hearing intact CN IX, X: gag intact, uvula midline CN XI: sternocleidomastoid and trapezius muscles intact CN XII: tongue midline Bulk & Tone: normal, no fasciculations. Motor:  muscle strength 5/5 throughout Sensation:  Pinprick, temperature and vibratory sensation intact. Deep Tendon Reflexes:  2+ throughout,  toes downgoing.   Finger to nose testing:  Without dysmetria.   Heel to shin:  Without dysmetria.   Gait:  Normal station and stride.  Romberg negative.    Thank you for allowing me to take part in the care of this patient.  Metta Clines, DO  CC: ***

## 2021-11-07 ENCOUNTER — Ambulatory Visit: Payer: No Typology Code available for payment source | Admitting: Neurology

## 2021-11-08 NOTE — Progress Notes (Signed)
NEUROLOGY CONSULTATION NOTE  Paul Colon MRN: [id number] DOB: April 21, 1929  Referring provider: Floreen Comber, PA-C Primary care provider: Floreen Comber, PA-C  Reason for consult:  TIA  Assessment/Plan:   Lightheadedness related to orthostatic hypotension and hyponatremia due to decrease oral intake.  I do not suspect TIA Essential tremor.  He does not exhibit signs or parkinsonism or other symptoms suggestive of progressive supranuclear palsy  1  No further recommendations from a neurologic perspective.  Follow up as needed.   Subjective:  Paul Colon is a 88 year old male with DM II and kidney disease who presents for TIA.  History supplemented by his accompanying daughter, ED and [facility].  Got at breakfast table eyes going up, started talking Forearms sweating - no shaking  - I felt dizzy and but head on kitchen table - started eating and drinking - positive orthostatics  TIA - drinking water all day and not eating   On 09/26/2021, he had just sat down at the breakfast table when he started not feeling well.  As per his daughter, his eyes rolled up and his speech was slurred  Lasted a minute.  His forearms were sweaty.  No facial droop, unilateral weakness or shaking.  After it was over, he said he felt dizzy like he was going to pass out and rested his head on the table.  He hadn't eaten or drank anything yet.  He had something to eat and drink and started feeling better.  He went to the [facility] for physical therapy were he was found to have orthostatic hypotension.  Due to concern for TIA, he was directed to go to the ED for further evaluation.  CT head showed no acute stroke, EKG unremarkable, Na low at 130, glucose elevated at 246. UA was positive for WBCs and LE but not treated as he was asymptomatic.  He later had a follow up MRI of the brain on 10/24/2021 which demonstrated mild chronic small vessel ischemic changes as well as generalized atrophy with  asymmetric volume loss of the right hippocampus and possible disproportionate midbrain atrophy but no evidence of stroke.  He does have tremor in the hands.  His sister also has tremor.  Denies double vision.  He has since been staying with his daughter.  He is eating better, keeping hydrated with water and electrolytes.  Yesterday, he briefly felt lightheaded but otherwise no recurrence.  Recent labs:  Hgb A1c 7.7, LDL 111     PAST MEDICAL HISTORY: Past Medical History:  Diagnosis Date   Diabetes mellitus without complication (HCC)    Kidney disease     PAST SURGICAL HISTORY: No past surgical history on file.  MEDICATIONS: Current Outpatient Medications on File Prior to Visit  Medication Sig Dispense Refill   dorzolamide-timolol (COSOPT) 22.3-6.8 MG/ML ophthalmic solution Place 1 drop into both eyes 2 (two) times daily.     metFORMIN (GLUCOPHAGE) 500 MG tablet Take 1 tablet (500 mg total) by mouth 2 (two) times daily with a meal. 30 tablet 0   Multiple Vitamins-Minerals (ONE-A-DAY MENS 50+ PO) Take 1 tablet by mouth in the morning.     Saw Palmetto, Serenoa repens, (SAW PALMETTO PO) Take 2 capsules by mouth in the morning.     tamsulosin (FLOMAX) 0.4 MG CAPS capsule Take 0.4 mg by mouth daily after supper.     No current facility-administered medications on file prior to visit.    ALLERGIES: Allergies  Allergen Reactions   Alogliptin Other (See  Comments)    Unknown reaction    Penicillin G Rash    FAMILY HISTORY: No family history on file.  Objective:  Blood pressure (!) 150/77, pulse 72, height 5\' 11"  (1.803 m), weight 151 lb 12.8 oz (68.9 kg), SpO2 98 %. General: No acute distress.  Patient appears well-groomed.   Head:  Normocephalic/atraumatic Eyes:  fundi examined but not visualized Neck: supple, no paraspinal tenderness, full range of motion Back: No paraspinal tenderness Heart: regular rate and rhythm Lungs: Clear to auscultation bilaterally. Vascular: No  carotid bruits. Neurological Exam: Mental status: alert and oriented to person, place, and time, speech fluent and not dysarthric, language intact. Cranial nerves: CN I: not tested CN II: pupils equal, round and reactive to light, visual fields intact CN III, IV, VI:  full range of motion, no nystagmus, no ptosis CN V: facial sensation intact. CN VII: upper and lower face symmetric CN VIII: hearing intact CN IX, X: gag intact, uvula midline CN XI: sternocleidomastoid and trapezius muscles intact CN XII: tongue midline Bulk & Tone: some atrophy of intrinsic hand muscles; no fasciculations. Motor:  muscle strength 5/5 throughout Sensation:  Pinprick,and vibratory sensation reduced in feet. Deep Tendon Reflexes:  3+ in patellars, otherwise, 2+ throughout,  toes downgoing.   Finger to nose testing:  Postural and kinetic tremor bilaterally Gait:  Steady, fairly good stride, normal bilateral arm swing.  Romberg positive.    Thank you for allowing me to take part in the care of this patient.  Metta Clines, DO  CC: [facility]

## 2021-11-10 ENCOUNTER — Encounter: Payer: Self-pay | Admitting: Neurology

## 2021-11-10 ENCOUNTER — Ambulatory Visit (INDEPENDENT_AMBULATORY_CARE_PROVIDER_SITE_OTHER): Payer: No Typology Code available for payment source | Admitting: Neurology

## 2021-11-10 VITALS — BP 150/77 | HR 72 | Ht 71.0 in | Wt 151.8 lb

## 2021-11-10 DIAGNOSIS — I951 Orthostatic hypotension: Secondary | ICD-10-CM | POA: Diagnosis not present

## 2021-11-10 DIAGNOSIS — G250 Essential tremor: Secondary | ICD-10-CM

## 2021-11-10 NOTE — Patient Instructions (Signed)
I don't think you had a stroke/TIA and I don't think that you have progressive supranuclear palsy I think that the symptoms were related to low blood pressure.  The tremors in your hands are benign and often run in families.

## 2022-01-20 ENCOUNTER — Emergency Department (HOSPITAL_COMMUNITY): Payer: No Typology Code available for payment source

## 2022-01-20 ENCOUNTER — Inpatient Hospital Stay (HOSPITAL_COMMUNITY): Payer: No Typology Code available for payment source | Admitting: Anesthesiology

## 2022-01-20 ENCOUNTER — Encounter (HOSPITAL_COMMUNITY): Payer: Self-pay | Admitting: Internal Medicine

## 2022-01-20 ENCOUNTER — Inpatient Hospital Stay (HOSPITAL_COMMUNITY)
Admission: EM | Admit: 2022-01-20 | Discharge: 2022-01-25 | DRG: 522 | Disposition: A | Discharge status: Home Health | Payer: No Typology Code available for payment source | Attending: Internal Medicine | Admitting: Internal Medicine

## 2022-01-20 DIAGNOSIS — H409 Unspecified glaucoma: Secondary | ICD-10-CM | POA: Diagnosis present

## 2022-01-20 DIAGNOSIS — N179 Acute kidney failure, unspecified: Secondary | ICD-10-CM | POA: Diagnosis present

## 2022-01-20 DIAGNOSIS — S72032A Displaced midcervical fracture of left femur, initial encounter for closed fracture: Principal | ICD-10-CM | POA: Diagnosis present

## 2022-01-20 DIAGNOSIS — I9581 Postprocedural hypotension: Secondary | ICD-10-CM | POA: Diagnosis not present

## 2022-01-20 DIAGNOSIS — N400 Enlarged prostate without lower urinary tract symptoms: Secondary | ICD-10-CM | POA: Diagnosis present

## 2022-01-20 DIAGNOSIS — W010XXA Fall on same level from slipping, tripping and stumbling without subsequent striking against object, initial encounter: Secondary | ICD-10-CM | POA: Diagnosis present

## 2022-01-20 DIAGNOSIS — E119 Type 2 diabetes mellitus without complications: Secondary | ICD-10-CM | POA: Diagnosis not present

## 2022-01-20 DIAGNOSIS — N289 Disorder of kidney and ureter, unspecified: Secondary | ICD-10-CM | POA: Diagnosis not present

## 2022-01-20 DIAGNOSIS — Z96642 Presence of left artificial hip joint: Secondary | ICD-10-CM | POA: Diagnosis not present

## 2022-01-20 DIAGNOSIS — Z7982 Long term (current) use of aspirin: Secondary | ICD-10-CM | POA: Diagnosis not present

## 2022-01-20 DIAGNOSIS — I951 Orthostatic hypotension: Secondary | ICD-10-CM | POA: Diagnosis not present

## 2022-01-20 DIAGNOSIS — Y92009 Unspecified place in unspecified non-institutional (private) residence as the place of occurrence of the external cause: Secondary | ICD-10-CM

## 2022-01-20 DIAGNOSIS — E1165 Type 2 diabetes mellitus with hyperglycemia: Secondary | ICD-10-CM | POA: Diagnosis present

## 2022-01-20 DIAGNOSIS — E785 Hyperlipidemia, unspecified: Secondary | ICD-10-CM | POA: Diagnosis present

## 2022-01-20 DIAGNOSIS — W19XXXA Unspecified fall, initial encounter: Secondary | ICD-10-CM

## 2022-01-20 DIAGNOSIS — E1122 Type 2 diabetes mellitus with diabetic chronic kidney disease: Secondary | ICD-10-CM | POA: Diagnosis present

## 2022-01-20 DIAGNOSIS — D62 Acute posthemorrhagic anemia: Secondary | ICD-10-CM | POA: Diagnosis not present

## 2022-01-20 DIAGNOSIS — N1831 Chronic kidney disease, stage 3a: Secondary | ICD-10-CM | POA: Diagnosis present

## 2022-01-20 DIAGNOSIS — E1140 Type 2 diabetes mellitus with diabetic neuropathy, unspecified: Secondary | ICD-10-CM | POA: Diagnosis present

## 2022-01-20 DIAGNOSIS — Z888 Allergy status to other drugs, medicaments and biological substances status: Secondary | ICD-10-CM

## 2022-01-20 DIAGNOSIS — Z880 Allergy status to penicillin: Secondary | ICD-10-CM | POA: Diagnosis not present

## 2022-01-20 DIAGNOSIS — R0609 Other forms of dyspnea: Secondary | ICD-10-CM | POA: Diagnosis not present

## 2022-01-20 DIAGNOSIS — S72002A Fracture of unspecified part of neck of left femur, initial encounter for closed fracture: Secondary | ICD-10-CM | POA: Diagnosis present

## 2022-01-20 DIAGNOSIS — Z7984 Long term (current) use of oral hypoglycemic drugs: Secondary | ICD-10-CM

## 2022-01-20 DIAGNOSIS — Z79899 Other long term (current) drug therapy: Secondary | ICD-10-CM | POA: Diagnosis not present

## 2022-01-20 DIAGNOSIS — S72002D Fracture of unspecified part of neck of left femur, subsequent encounter for closed fracture with routine healing: Secondary | ICD-10-CM | POA: Diagnosis not present

## 2022-01-20 LAB — BASIC METABOLIC PANEL
Anion gap: 8 (ref 5–15)
BUN: 29 mg/dL — ABNORMAL HIGH (ref 8–23)
CO2: 25 mmol/L (ref 22–32)
Calcium: 9.1 mg/dL (ref 8.9–10.3)
Chloride: 102 mmol/L (ref 98–111)
Creatinine, Ser: 1.51 mg/dL — ABNORMAL HIGH (ref 0.61–1.24)
GFR, Estimated: 44 mL/min — ABNORMAL LOW (ref >60)
Glucose, Bld: 281 mg/dL — ABNORMAL HIGH (ref 70–99)
Potassium: 4.6 mmol/L (ref 3.5–5.1)
Sodium: 135 mmol/L (ref 135–145)

## 2022-01-20 LAB — CK: Total CK: 73 U/L (ref 49–397)

## 2022-01-20 LAB — URINALYSIS, ROUTINE W REFLEX MICROSCOPIC
Bacteria, UA: NONE SEEN
Bilirubin Urine: NEGATIVE
Glucose, UA: >=500 mg/dL — AB
Hgb urine dipstick: NEGATIVE
Ketones, ur: NEGATIVE mg/dL
Leukocytes,Ua: NEGATIVE
Nitrite: NEGATIVE
Protein, ur: NEGATIVE mg/dL
Specific Gravity, Urine: 1.017 (ref 1.005–1.030)
pH: 5 (ref 5.0–8.0)

## 2022-01-20 LAB — CBC WITH DIFFERENTIAL/PLATELET
Abs Immature Granulocytes: 0.04 10*3/uL (ref 0.00–0.07)
Basophils Absolute: 0 10*3/uL (ref 0.0–0.1)
Basophils Relative: 0 %
Eosinophils Absolute: 0.2 10*3/uL (ref 0.0–0.5)
Eosinophils Relative: 3 %
HCT: 35 % — ABNORMAL LOW (ref 39.0–52.0)
Hemoglobin: 11.8 g/dL — ABNORMAL LOW (ref 13.0–17.0)
Immature Granulocytes: 0 %
Lymphocytes Relative: 10 %
Lymphs Abs: 0.9 10*3/uL (ref 0.7–4.0)
MCH: 31.1 pg (ref 26.0–34.0)
MCHC: 33.7 g/dL (ref 30.0–36.0)
MCV: 92.3 fL (ref 80.0–100.0)
Monocytes Absolute: 0.7 10*3/uL (ref 0.1–1.0)
Monocytes Relative: 7 %
Neutro Abs: 7.1 10*3/uL (ref 1.7–7.7)
Neutrophils Relative %: 80 %
Platelets: 220 10*3/uL (ref 150–400)
RBC: 3.79 MIL/uL — ABNORMAL LOW (ref 4.22–5.81)
RDW: 13 % (ref 11.5–15.5)
WBC: 9 10*3/uL (ref 4.0–10.5)
nRBC: 0 % (ref 0.0–0.2)

## 2022-01-20 LAB — ABO/RH: ABO/RH(D): O POS

## 2022-01-20 LAB — GLUCOSE, CAPILLARY: Glucose-Capillary: 246 mg/dL — ABNORMAL HIGH (ref 70–99)

## 2022-01-20 MED ORDER — SENNOSIDES-DOCUSATE SODIUM 8.6-50 MG PO TABS
1.0000 | ORAL_TABLET | Freq: Every day | ORAL | Status: DC
  Administered 2022-01-20: 1 via ORAL
  Filled 2022-01-20: qty 1

## 2022-01-20 MED ORDER — INSULIN ASPART 100 UNIT/ML IJ SOLN: 0.0000 [IU] | Freq: Three times a day (TID) | INTRAMUSCULAR | Status: DC

## 2022-01-20 MED ORDER — SODIUM CHLORIDE 0.9 % IV SOLN: INTRAVENOUS | Status: DC

## 2022-01-20 MED ORDER — ATORVASTATIN CALCIUM 10 MG PO TABS
20.0000 mg | ORAL_TABLET | Freq: Every evening | ORAL | Status: DC
  Administered 2022-01-21 – 2022-01-24 (×4): 20 mg via ORAL
  Filled 2022-01-20 (×4): qty 2

## 2022-01-20 MED ORDER — SENNA 8.6 MG PO TABS
1.0000 | ORAL_TABLET | Freq: Two times a day (BID) | ORAL | Status: DC
  Administered 2022-01-20 (×2): 8.6 mg via ORAL
  Filled 2022-01-20 (×2): qty 1

## 2022-01-20 MED ORDER — ENOXAPARIN SODIUM 40 MG/0.4ML IJ SOSY
40.0000 mg | PREFILLED_SYRINGE | Freq: Every day | INTRAMUSCULAR | Status: DC
  Administered 2022-01-20: 40 mg via SUBCUTANEOUS
  Filled 2022-01-20: qty 0.4

## 2022-01-20 MED ORDER — INSULIN ASPART 100 UNIT/ML IJ SOLN
0.0000 [IU] | Freq: Three times a day (TID) | INTRAMUSCULAR | Status: DC
  Administered 2022-01-22 – 2022-01-23 (×4): 4 [IU] via SUBCUTANEOUS
  Administered 2022-01-23: 3 [IU] via SUBCUTANEOUS
  Administered 2022-01-23: 2 [IU] via SUBCUTANEOUS
  Administered 2022-01-24: 3 [IU] via SUBCUTANEOUS

## 2022-01-20 MED ORDER — MORPHINE SULFATE (PF) 2 MG/ML IV SOLN: 0.5000 mg | INTRAVENOUS | Status: DC | PRN

## 2022-01-20 MED ORDER — HYDROCODONE-ACETAMINOPHEN 5-325 MG PO TABS: 1.0000 | ORAL_TABLET | Freq: Four times a day (QID) | ORAL | Status: DC | PRN

## 2022-01-20 MED ORDER — TAMSULOSIN HCL 0.4 MG PO CAPS
0.4000 mg | ORAL_CAPSULE | Freq: Every day | ORAL | Status: DC
  Filled 2022-01-20: qty 1

## 2022-01-20 MED ORDER — INSULIN ASPART 100 UNIT/ML IJ SOLN: 0.0000 [IU] | Freq: Every day | INTRAMUSCULAR | Status: DC

## 2022-01-20 MED ORDER — FENTANYL CITRATE (PF) 100 MCG/2ML IJ SOLN
INTRAMUSCULAR | Status: AC
  Administered 2022-01-20: 25 ug
  Filled 2022-01-20: qty 2

## 2022-01-20 MED ORDER — OXYCODONE HCL 5 MG PO TABS
5.0000 mg | ORAL_TABLET | Freq: Once | ORAL | Status: AC
  Administered 2022-01-20: 5 mg via ORAL
  Filled 2022-01-20: qty 1

## 2022-01-20 MED ORDER — BUPIVACAINE-EPINEPHRINE (PF) 0.5% -1:200000 IJ SOLN
INTRAMUSCULAR | Status: DC | PRN
  Administered 2022-01-20: 20 mL via PERINEURAL

## 2022-01-20 MED ORDER — OXYCODONE HCL 5 MG PO TABS: 5.0000 mg | ORAL_TABLET | ORAL | Status: DC | PRN

## 2022-01-20 MED ORDER — ONDANSETRON HCL 4 MG/2ML IJ SOLN
4.0000 mg | Freq: Once | INTRAMUSCULAR | Status: AC
  Administered 2022-01-20: 4 mg via INTRAVENOUS
  Filled 2022-01-20: qty 2

## 2022-01-20 MED ORDER — FENTANYL CITRATE PF 50 MCG/ML IJ SOSY
50.0000 ug | PREFILLED_SYRINGE | INTRAMUSCULAR | Status: DC | PRN
  Administered 2022-01-20: 50 ug via INTRAVENOUS
  Filled 2022-01-20: qty 1

## 2022-01-20 MED ORDER — FENTANYL CITRATE (PF) 100 MCG/2ML IJ SOLN
INTRAMUSCULAR | Status: DC | PRN
  Administered 2022-01-20: 50 ug via INTRAVENOUS

## 2022-01-20 MED ORDER — SODIUM CHLORIDE 0.9 % IV SOLN: INTRAVENOUS | Status: AC

## 2022-01-20 NOTE — TOC Initial Note (Signed)
Transition of Care [facility]) - Initial/Assessment Note    Patient Details  Name: Paul Colon MRN: [id number] Date of Birth: 1929/07/31  Transition of Care [facility]) CM/SW Contact:    Lockie Pares, RN Phone Number: 01/20/2022, 2:10 PM  Clinical Narrative:                 Patient presented to ED with hip pain after a mechanical fall last night. Left femoral neck fx. Patient fairly functional prior to fall. Lives with Daughter.  Possible fix tomorrow. Will need PT post op to assess for recommendations for post hospital [facility] will follow for needs, recommendations, and transitions of Care..   Expected Discharge Plan: Home w Home Health Services [facility] versus SNF) Barriers to Discharge: Continued Medical Work up   Patient Goals and CMS Choice        Expected Discharge Plan and Services Expected Discharge Plan: Home w Home Health Services [facility] versus SNF)       Living arrangements for the past 2 months: Single Family Home                                      Prior Living Arrangements/Services Living arrangements for the past 2 months: Single Family Home Lives with:: Adult Children Patient language and need for interpreter reviewed:: Yes        Need for Family Participation in Patient Care: Yes (Comment) Care giver support system in place?: Yes (comment)   Criminal Activity/Legal Involvement Pertinent to Current Situation/Hospitalization: No - Comment as needed  Activities of Daily Living      Permission Sought/Granted                  Emotional Assessment       Orientation: : Oriented to Self, Oriented to Place, Oriented to  Time Alcohol / Substance Use: Not Applicable Psych Involvement: No (comment)  Admission diagnosis:  Fracture of femoral neck, left, closed (HCC) [S72.002A] Patient Active Problem List   Diagnosis Date Noted   Fracture of femoral neck, left, closed (HCC) 01/20/2022   Hyponatremia 09/06/2021   Generalized weakness 09/06/2021   Type 2  diabetes mellitus (HCC) 09/06/2021   Chronic kidney disease, stage 3a (HCC) 09/06/2021   Normocytic anemia 09/06/2021   PCP:  [facility] Pharmacy:   [id number] - [place], [place] - [address] [place] [address] Phone: [phone] Fax: [phone]     Social Determinants of Health (SDOH) Interventions    Readmission Risk Interventions     No data to display

## 2022-01-20 NOTE — ED Notes (Signed)
Pt placed on 2L  for sats of 78% that increased to 96%

## 2022-01-20 NOTE — Progress Notes (Signed)
Patient arrived to PACU at 1740.  VSS.  Dr. Jean Rosenthal at bedside.  Patient A & Ox4.  Patient signed consents and questions answered.  MD ordered fentanyl to be given for procedure.  Time out done at 1752.  Fentanyl given at 1758.  Procedure done at 1803.  Patient resting comfortably and VSS.  Will watch patient in PACU for 10 min per MD instructions.

## 2022-01-20 NOTE — Consult Note (Signed)
  Pt with displaced left femoral neck fracture after a fall today.  Consult note to follow.  NPO after midnight.  OR tomorrow.  Hold blood thinners.

## 2022-01-20 NOTE — Anesthesia Procedure Notes (Signed)
Anesthesia Regional Block: Femoral nerve block   Pre-Anesthetic Checklist: , timeout performed,  Correct Patient, Correct Site, Correct Laterality,  Correct Procedure, Correct Position, site marked,  Risks and benefits discussed,  Surgical consent,  Pre-op evaluation,  At surgeon's request and post-op pain management  Laterality: Left and Lower  Prep: chloraprep       Needles:  Injection technique: Single-shot  Needle Type: Echogenic Needle     Needle Length: 9cm  Needle Gauge: 21     Additional Needles:   Procedures:,,,, ultrasound used (permanent image in chart),,    Narrative:  Start time: 01/20/2022 5:57 PM End time: 01/20/2022 6:03 PM Injection made incrementally with aspirations every 5 mL.  Performed by: Personally  Anesthesiologist: Jairo Ben, MD  Additional Notes: Pt identified in Holding room.  Monitors applied. Working IV access confirmed. Sterile prep L groin.  #21ga ECHOgenic Arrow block needle to fem nerve with US guidance.  20cc 0.5% Bupivacaine 1:200k epi injected incrementally after negative test dose.  Patient asymptomatic, VSS, no heme aspirated, tolerated well.   Sandford Craze, MD

## 2022-01-20 NOTE — Anesthesia Preprocedure Evaluation (Signed)
Anesthesia Evaluation  Patient identified by MRN, date of birth, ID band Patient awake    Reviewed: Allergy & Precautions, NPO status , Patient's Chart, lab work & pertinent test results  History of Anesthesia Complications Negative for: history of anesthetic complications  Airway Mallampati: II  TM Distance: >3 FB Neck ROM: Full    Dental  (+) Edentulous Upper, Edentulous Lower, Lower Dentures, Upper Dentures, Dental Advisory Given   Pulmonary neg pulmonary ROS   breath sounds clear to auscultation       Cardiovascular (-) angina negative cardio ROS  Rhythm:Regular Rate:Normal     Neuro/Psych negative neurological ROS     GI/Hepatic negative GI ROS, Neg liver ROS,,,  Endo/Other  diabetes (glu 281), Oral Hypoglycemic Agents    Renal/GU Renal InsufficiencyRenal disease     Musculoskeletal L femoral neck fracture    Abdominal   Peds  Hematology negative hematology ROS (+)   Anesthesia Other Findings   Reproductive/Obstetrics                              Anesthesia Physical Anesthesia Plan  ASA: 3  Anesthesia Plan: Regional   Post-op Pain Management: Minimal or no pain anticipated   Induction:   PONV Risk Score and Plan: 1 and Treatment may vary due to age or medical condition  Airway Management Planned: Natural Airway and Nasal Cannula  Additional Equipment: None  Intra-op Plan:   Post-operative Plan:   Informed Consent: I have reviewed the patients History and Physical, chart, labs and discussed the procedure including the risks, benefits and alternatives for the proposed anesthesia with the patient or authorized representative who has indicated his/her understanding and acceptance.     Dental advisory given  Plan Discussed with:   Anesthesia Plan Comments: (L femoral neck fracture: Plan femoral nerve block for analgesia while awaiting definitive ortho management)          Anesthesia Quick Evaluation

## 2022-01-20 NOTE — Plan of Care (Signed)

## 2022-01-20 NOTE — ED Notes (Signed)
Trauma Event Note    NON TRAUMA  pt ---  Assisted pt getting into bed fropm wheelchair from triage--- fell last night, has not been able to walk on left leg since. Family assisted him into bed and then when he could not walk this am family brought pt to ED.  Pt in severe pain when moving.  No shortening or externally rotation noted Last imported Vital Signs BP 111/60 (BP Location: Right Arm)   Pulse 70   Temp 98.2 F (36.8 C)   Resp 16   SpO2 95%   Trending CBC Recent Labs    01/20/22 1135  WBC 9.0  HGB 11.8*  HCT 35.0*  PLT 220    Trending Coag's No results for input(s): "APTT", "INR" in the last 72 hours.  Trending BMET No results for input(s): "NA", "K", "CL", "CO2", "BUN", "CREATININE", "GLUCOSE" in the last 72 hours.    Lesle Chris Shantoria Ellwood  Trauma Response RN  Please call TRN at [phone] for further assistance.

## 2022-01-20 NOTE — ED Provider Notes (Signed)
[facility] EMERGENCY DEPARTMENT Provider Note   CSN: [id number] Arrival date & time: 01/20/22  1108     History  Chief Complaint  Patient presents with   Paul Colon is a 88 y.o. male.   Fall  Patient presents with left hip pain following a mechanical fall last night.  Medical history includes T2DM, CKD, anemia.  Patient was in his normal state of health up until this fall last night.  At the time of the fall, he was pulling some close in the kitchen.  He lost his balance causing him to strike the refrigerator and then fell to the ground.  Ground is described as hard with overlying rug.  When he fell, he struck the lateral aspect of his left hip.  He subsequently had pain in the posterior lateral aspect of his left hip.  He was unable to bear weight on it.  This morning, pain is continued and it radiates to the medial thigh area.  He took some Tylenol this morning.  Last time he ate was a few bites of yogurt this morning.  He is not on any blood thinning medications.  Currently, he endorses pain throughout his left hip area.  He denies any other areas of discomfort.     Home Medications Prior to Admission medications   Medication Sig Start Date End Date Taking? Authorizing Provider  aspirin EC 81 MG tablet TAKE ONE TABLET BY MOUTH DAILY FOR HEART 10/03/21   [provider]  atorvastatin (LIPITOR) 20 MG tablet TAKE ONE TABLET BY MOUTH AT BEDTIME FOR CHOLESTEROL 10/03/21   [provider]  dorzolamide-timolol (COSOPT) 22.3-6.8 MG/ML ophthalmic solution Place 1 drop into both eyes 2 (two) times daily.    [provider]  metFORMIN (GLUCOPHAGE) 500 MG tablet Take 1 tablet (500 mg total) by mouth 2 (two) times daily with a meal. 04/23/21   Margarita Grizzle, MD  Multiple Vitamins-Minerals (ONE-A-DAY MENS 50+ PO) Take 1 tablet by mouth in the morning.    [provider]  Saw Palmetto, Serenoa repens, (SAW PALMETTO PO) Take 2 capsules  by mouth in the morning.    [provider]  tamsulosin (FLOMAX) 0.4 MG CAPS capsule Take 0.4 mg by mouth daily after supper.    [provider]      Allergies    Alogliptin and Penicillin g    Review of Systems   Review of Systems  Musculoskeletal:  Positive for arthralgias and gait problem.  All other systems reviewed and are negative.   Physical Exam Updated Vital Signs BP 111/60 (BP Location: Right Arm)   Pulse 70   Temp 98.2 F (36.8 C)   Resp 16   SpO2 95%  Physical Exam Vitals and nursing note reviewed.  Constitutional:      General: He is not in acute distress.    Appearance: Normal appearance. He is well-developed. He is not ill-appearing, toxic-appearing or diaphoretic.  HENT:     Head: Normocephalic and atraumatic.     Right Ear: External ear normal.     Left Ear: External ear normal.     Nose: Nose normal.     Mouth/Throat:     Mouth: Mucous membranes are moist.  Eyes:     Extraocular Movements: Extraocular movements intact.     Conjunctiva/sclera: Conjunctivae normal.  Cardiovascular:     Rate and Rhythm: Normal rate and regular rhythm.  Pulmonary:     Effort: Pulmonary effort is normal.  No respiratory distress.  Abdominal:     General: There is no distension.     Palpations: Abdomen is soft.     Tenderness: There is no abdominal tenderness.  Musculoskeletal:        General: Signs of injury present. No swelling.     Cervical back: Normal range of motion and neck supple. No rigidity.     Right lower leg: No edema.     Left lower leg: No edema.  Skin:    General: Skin is warm and dry.     Capillary Refill: Capillary refill takes less than 2 seconds.  Neurological:     General: No focal deficit present.     Mental Status: He is alert and oriented to person, place, and time.     Cranial Nerves: No cranial nerve deficit.     Sensory: No sensory deficit.     Motor: No weakness.     Coordination: Coordination normal.  Psychiatric:         Mood and Affect: Mood normal.        Behavior: Behavior normal.        Thought Content: Thought content normal.        Judgment: Judgment normal.     ED Results / Procedures / Treatments   Labs (all labs ordered are listed, but only abnormal results are displayed) Labs Reviewed  CBC WITH DIFFERENTIAL/PLATELET - Abnormal; Notable for the following components:      Result Value   RBC 3.79 (*)    Hemoglobin 11.8 (*)    HCT 35.0 (*)    All other components within normal limits  BASIC METABOLIC PANEL - Abnormal; Notable for the following components:   Glucose, Bld 281 (*)    BUN 29 (*)    Creatinine, Ser 1.51 (*)    GFR, Estimated 44 (*)    All other components within normal limits  CK  URINALYSIS, ROUTINE W REFLEX MICROSCOPIC    EKG None  Radiology DG Chest Portable 1 View  Result Date: 01/20/2022 CLINICAL DATA:  Tripped and fell.  Left hip pain. EXAM: PORTABLE CHEST 1 VIEW COMPARISON:  04/23/2021 FINDINGS: Heart size is normal. Mild aortic tortuosity. The lungs are clear. The vascularity is normal. No effusions. No abnormal bone finding. IMPRESSION: No active disease. Mild aortic tortuosity. Electronically Signed   By: Paulina Fusi M.D.   On: 01/20/2022 12:15   DG Pelvis 1-2 Views  Result Date: 01/20/2022 CLINICAL DATA:  Tripped and fell.  Left hip pain. EXAM: PELVIS - 1-2 VIEW COMPARISON:  None Available. FINDINGS: Probable femoral neck fracture on the left. Recommend specific hip radiography. No other fracture the pelvic region bones. IMPRESSION: Probable femoral neck fracture on the left. Recommend specific hip radiography. Electronically Signed   By: Paulina Fusi M.D.   On: 01/20/2022 12:15    Procedures Procedures    Medications Ordered in ED Medications  oxyCODONE (Oxy IR/ROXICODONE) immediate release tablet 5 mg (has no administration in time range)  HYDROcodone-acetaminophen (NORCO/VICODIN) 5-325 MG per tablet 1-2 tablet (has no administration in time range)   morphine (PF) 2 MG/ML injection 0.5 mg (has no administration in time range)  enoxaparin (LOVENOX) injection 40 mg (has no administration in time range)  oxyCODONE (Oxy IR/ROXICODONE) immediate release tablet 5 mg (has no administration in time range)  senna (SENOKOT) tablet 8.6 mg (has no administration in time range)  senna-docusate (Senokot-S) tablet 1 tablet (has no administration in time range)  insulin aspart (novoLOG) injection  0-9 Units (has no administration in time range)  insulin aspart (novoLOG) injection 0-5 Units (has no administration in time range)  ondansetron (ZOFRAN) injection 4 mg (4 mg Intravenous Given 01/20/22 1236)    ED Course/ Medical Decision Making/ A&P                           Medical Decision Making Amount and/or Complexity of Data Reviewed Radiology: ordered.  Risk Prescription drug management.   This patient presents to the ED for concern of left hip pain, this involves an extensive number of treatment options, and is a complaint that carries with it a high risk of complications and morbidity.  The differential diagnosis includes fracture, pelvic fracture, soft tissue injury   Co morbidities that complicate the patient evaluation  T2DM, CKD, anemia   Additional history obtained:  Additional history obtained from patient's family External records from outside source obtained and reviewed including EMR   Lab Tests:  I Ordered, and personally interpreted labs.  The pertinent results include: Baseline anemia, no leukocytosis, and is increased from baseline.  Hyperglycemia is present without evidence of DKA.  Electrolytes are normal.   Imaging Studies ordered:  I ordered imaging studies including chest x-ray, left hip x-ray I independently visualized and interpreted imaging which showed femoral neck fracture I agree with the radiologist interpretation   Cardiac Monitoring: / EKG:  The patient was maintained on a cardiac monitor.  I  personally viewed and interpreted the cardiac monitored which showed an underlying rhythm of: Sinus rhythm   Consultations Obtained:  I requested consultation with the orthopedic surgeon, Dr. Victorino Dike,  and discussed lab and imaging findings as well as pertinent plan - they recommend: Admission to hospitalist.  Possible operative repair tomorrow   Problem List / ED Course / Critical interventions / Medication management  Patient is a functional 88 year old male presenting after a mechanical fall last night.  During this fall, he landed and struck the left side of his pelvic area.  He has since had pain throughout his left hip.  On arrival in the ED, he is alert and oriented.  He arrives with family members at bedside, who assist in history.  On exam, he does appear to have very slight shortening of his left leg.  Range of motion of the left leg is limited by pain.  Distal extremity is neurovascularly intact.  He has no other areas of pain or tenderness.  Patient was given Roxicodone for analgesia.  X-ray imaging of left hip was obtained.  X-ray shows confirmation of left Emerald neck fracture.  Patient and family were informed.  As needed fentanyl was ordered.  I spoke with orthopedic surgeon, Dr. Victorino Dike, who states that patient will not undergo surgery today.  He did request admission to hospitalist.  Patient was admitted for further management. I ordered medication including fentanyl, oxycodone for analgesia Reevaluation of the patient after these medicines showed that the patient improved I have reviewed the patients home medicines and have made adjustments as needed   Social Determinants of Health:  Lives at home with family         Final Clinical Impression(s) / ED Diagnoses Final diagnoses:  Closed fracture of neck of left femur, initial encounter [facility])    Rx / DC Orders ED Discharge Orders     None         Gloris Manchester, MD 01/20/22 1405

## 2022-01-20 NOTE — ED Provider Triage Note (Signed)
Emergency Medicine Provider Triage Evaluation Note  Paul Colon , a 88 y.o. male  was evaluated in triage.  Pt complains of left hip pain after a mechanical fall last night.  Typically ambulates without assistance.  Unable to ambulate after the fall.  No head injury or loss of consciousness.  He is not currently blood thinners.  Lives with daughter.  Review of Systems  Positive: Hip pain Negative: headache  Physical Exam  BP 111/60 (BP Location: Right Arm)   Pulse 70   Temp 98.2 F (36.8 C)   Resp 16   SpO2 95%  Gen:   Awake, no distress   Resp:  Normal effort  MSK:   Moves extremities without difficulty  Other:    Medical Decision Making  Medically screening exam initiated at 11:21 AM.  Appropriate orders placed.  Ethon Wymer was informed that the remainder of the evaluation will be completed by another provider, this initial triage assessment does not replace that evaluation, and the importance of remaining in the ED until their evaluation is complete.  X-ray labs   Mannie Stabile, [place] 01/20/22 1122

## 2022-01-20 NOTE — ED Triage Notes (Signed)
Patient here for evaluation of left hip pain that started after tripping and falling last night. Denies head injury, denies LOC. Reports difficulty trying to bear weight and ambulate after fall due to pain.

## 2022-01-20 NOTE — H&P (Addendum)
History and Physical    PatientMarland Kitchen Paul Colon WUJ:[id number] DOB: 1929/04/20 DOA: 01/20/2022 DOS: the patient was seen and examined on 01/20/2022 PCP: [facility]  Patient coming from: Home lives with daughter and   Chief Complaint:  Chief Complaint  Patient presents with   Fall   HPI: Paul Colon is a 88 y.o. male with medical history significant of diabetes mellitus type 2, neuropathy, and CKD stage III presents after having a fall at home last night while changing his clothes.  At baseline patient ambulates without assistance while in the house and utilizes a rollator when going out.  His daughter makes note that he has neuropathy for which he is unsteady on feet.  Patient denied having any lightheadedness or dizziness prior to the fall last night.  He is not on any blood thinners.  After the fall the patient was unable to bear weight on the left leg and complained of pain in the left hip.  He has been given Tylenol for pain, but did not help much.   Patient has been doing well at home with reports of him having physical therapy with improvement in overall strength.  He had a MRI of his heart as well was wore a Holter monitor earlier this year that noted some abnormality that the daughter is not totally sure of.  They had set him up to have a appointment with cardiology possibly in February or March.  Patient blood sugars had recently been over 200.  His daughter notes that the patient's last hemoglobin A1c was around 7.  Last time he was in the hospital in July patient had experienced low blood sugars due to insulin given.  Upon admission into the emergency department patient was noted to have stable vital signs.  He was placed on 2 L of nasal cannula oxygen after receiving IV pain medication.  Pelvic x-ray revealed probable left femoral neck fracture.  Chest x-ray noted no acute abnormality.  Labs significant for hemoglobin 11.8, BUN 29, creatinine 1.51, and glucose 281.  Dr. Victorino Dike  of orthopedics consulted and planning on possible surgery tomorrow.  Review of Systems: As mentioned in the history of present illness. All other systems reviewed and are negative. Past Medical History:  Diagnosis Date   Diabetes mellitus without complication (HCC)    Kidney disease    No past surgical history on file. Social History:  reports that he has never smoked. He has never used smokeless tobacco. He reports that he does not drink alcohol and does not use drugs.  Allergies  Allergen Reactions   Alogliptin Other (See Comments)    Unknown reaction    Penicillin G Rash    No family history on file.  Prior to Admission medications   Medication Sig Start Date End Date Taking? Authorizing Provider  aspirin EC 81 MG tablet TAKE ONE TABLET BY MOUTH DAILY FOR HEART 10/03/21   [provider]  atorvastatin (LIPITOR) 20 MG tablet TAKE ONE TABLET BY MOUTH AT BEDTIME FOR CHOLESTEROL 10/03/21   [provider]  dorzolamide-timolol (COSOPT) 22.3-6.8 MG/ML ophthalmic solution Place 1 drop into both eyes 2 (two) times daily.    [provider]  metFORMIN (GLUCOPHAGE) 500 MG tablet Take 1 tablet (500 mg total) by mouth 2 (two) times daily with a meal. 04/23/21   Margarita Grizzle, MD  Multiple Vitamins-Minerals (ONE-A-DAY MENS 50+ PO) Take 1 tablet by mouth in the morning.    [provider]  Saw Palmetto, Serenoa repens, (SAW  PALMETTO PO) Take 2 capsules by mouth in the morning.    [provider]  tamsulosin (FLOMAX) 0.4 MG CAPS capsule Take 0.4 mg by mouth daily after supper.    [provider]    Physical Exam: Vitals:   01/20/22 1114  BP: 111/60  Pulse: 70  Resp: 16  Temp: 98.2 F (36.8 C)  SpO2: 95%   Exam  Constitutional: Elderly male currently in no acute distress resting Eyes: PERRL, lids and conjunctivae normal ENMT: Mucous membranes are moist.  Hearing aids present bilaterally. Neck: normal, supple, no masses, no  thyromegaly Respiratory: clear to auscultation bilaterally.Normal respiratory effort.  O2 saturation currently maintained on 2 L of nasal cannula oxygen. Cardiovascular: Regular rate and rhythm, no murmurs / rubs / gallops. No extremity edema. Abdomen: no tenderness, no masses palpated. No hepatosplenomegaly. Bowel sounds positive.  Musculoskeletal: no clubbing / cyanosis.  Left leg externally rotated and mildly shortened. Skin: no rashes, lesions, ulcers. No induration Neurologic: CN 2-12 grossly intact. Strength 5/5 in all 4.  Psychiatric: Normal judgment and insight. Alert and oriented x 3. Normal mood.   Data Reviewed:    Assessment and Plan:  Left femoral neck fracture secondary to fall Acute.  Patient had what sounds like a mechanical fall last night while changing close.  Trace gave concern for a left femoral neck fracture.  On physical exam the leg is externally rotated and mildly shortened.  Dr. Victorino Dike of orthopedics consulted. -Admit to a surgical telemetry bed -Hip fracture order set utilized -N.p.o. after midnight -Nerve block if applicable -Morphine/oxycodone as needed for moderate to severe pain respectively -Transitions of care consulted -Orthopedics consulted,  will follow-up for any further recommendations  Acute kidney injury Patient presents with creatinine elevated at 1.51 with BUN 29.  Baseline creatinine previously noted to be around 1 in July. -Add on CK if able given fall -Check urinalysis -Normal saline IV fluids at 75 mL/h overnight -Recheck kidney function in a.m.  Diabetes mellitus type 2, with hyperglycemia On admission glucose 281.  Home medication regimen includes metformin.  Possibly secondary to acute stress response in setting acute fracture. -Hypoglycemic protocols -Hold metformin -CBGs before every meal with very sensitive SSI -Adjust insulin regimen as needed  BPH -Continue Flomax  Hyperlipidemia -Continue atorvastatin  Orders placed  to try and obtain records from [place]  VTE prophylaxis: Lovenox Advance Care Planning:   Code Status: Full Code.  Confirmed with daughter present at bedside  Consults: Orthopedics  Family Communication: Daughter and son-in-law updated at bedside  Severity of Illness: The appropriate patient status for this patient is INPATIENT. Inpatient status is judged to be reasonable and necessary in order to provide the required intensity of service to ensure the patient's safety. The patient's presenting symptoms, physical exam findings, and initial radiographic and laboratory data in the context of their chronic comorbidities is felt to place them at high risk for further clinical deterioration. Furthermore, it is not anticipated that the patient will be medically stable for discharge from the hospital within 2 midnights of admission.   * I certify that at the point of admission it is my clinical judgment that the patient will require inpatient hospital care spanning beyond 2 midnights from the point of admission due to high intensity of service, high risk for further deterioration and high frequency of surveillance required.*  Author: Clydie Braun, MD 01/20/2022 1:50 PM  For on call review [email or web address].

## 2022-01-21 ENCOUNTER — Inpatient Hospital Stay (HOSPITAL_COMMUNITY): Payer: No Typology Code available for payment source

## 2022-01-21 ENCOUNTER — Encounter (HOSPITAL_COMMUNITY): Payer: Self-pay | Admitting: Internal Medicine

## 2022-01-21 ENCOUNTER — Inpatient Hospital Stay (HOSPITAL_COMMUNITY): Payer: No Typology Code available for payment source | Admitting: Anesthesiology

## 2022-01-21 ENCOUNTER — Other Ambulatory Visit: Payer: Self-pay

## 2022-01-21 ENCOUNTER — Encounter (HOSPITAL_COMMUNITY)
Admission: EM | Disposition: A | Discharge status: Home Health | Payer: Self-pay | Source: Home / Self Care | Attending: Internal Medicine

## 2022-01-21 DIAGNOSIS — N289 Disorder of kidney and ureter, unspecified: Secondary | ICD-10-CM

## 2022-01-21 DIAGNOSIS — E119 Type 2 diabetes mellitus without complications: Secondary | ICD-10-CM | POA: Diagnosis not present

## 2022-01-21 DIAGNOSIS — S72002A Fracture of unspecified part of neck of left femur, initial encounter for closed fracture: Secondary | ICD-10-CM | POA: Diagnosis not present

## 2022-01-21 DIAGNOSIS — S72002D Fracture of unspecified part of neck of left femur, subsequent encounter for closed fracture with routine healing: Secondary | ICD-10-CM

## 2022-01-21 DIAGNOSIS — Z96642 Presence of left artificial hip joint: Secondary | ICD-10-CM

## 2022-01-21 HISTORY — PX: TOTAL HIP ARTHROPLASTY: SHX124

## 2022-01-21 LAB — CBC
HCT: 33.5 % — ABNORMAL LOW (ref 39.0–52.0)
Hemoglobin: 11.1 g/dL — ABNORMAL LOW (ref 13.0–17.0)
MCH: 30.9 pg (ref 26.0–34.0)
MCHC: 33.1 g/dL (ref 30.0–36.0)
MCV: 93.3 fL (ref 80.0–100.0)
Platelets: 176 10*3/uL (ref 150–400)
RBC: 3.59 MIL/uL — ABNORMAL LOW (ref 4.22–5.81)
RDW: 13 % (ref 11.5–15.5)
WBC: 8.2 10*3/uL (ref 4.0–10.5)
nRBC: 0 % (ref 0.0–0.2)

## 2022-01-21 LAB — BASIC METABOLIC PANEL
Anion gap: 8 (ref 5–15)
BUN: 20 mg/dL (ref 8–23)
CO2: 27 mmol/L (ref 22–32)
Calcium: 8.7 mg/dL — ABNORMAL LOW (ref 8.9–10.3)
Chloride: 99 mmol/L (ref 98–111)
Creatinine, Ser: 1.31 mg/dL — ABNORMAL HIGH (ref 0.61–1.24)
GFR, Estimated: 52 mL/min — ABNORMAL LOW (ref >60)
Glucose, Bld: 210 mg/dL — ABNORMAL HIGH (ref 70–99)
Potassium: 4.3 mmol/L (ref 3.5–5.1)
Sodium: 134 mmol/L — ABNORMAL LOW (ref 135–145)

## 2022-01-21 LAB — TYPE AND SCREEN
ABO/RH(D): O POS
Antibody Screen: NEGATIVE

## 2022-01-21 LAB — GLUCOSE, CAPILLARY
Glucose-Capillary: 175 mg/dL — ABNORMAL HIGH (ref 70–99)
Glucose-Capillary: 156 mg/dL — ABNORMAL HIGH (ref 70–99)
Glucose-Capillary: 161 mg/dL — ABNORMAL HIGH (ref 70–99)
Glucose-Capillary: 326 mg/dL — ABNORMAL HIGH (ref 70–99)

## 2022-01-21 LAB — SURGICAL PCR SCREEN
MRSA, PCR: NEGATIVE
Staphylococcus aureus: NEGATIVE

## 2022-01-21 SURGERY — ARTHROPLASTY, HIP, TOTAL, ANTERIOR APPROACH
Anesthesia: General | Site: Hip | Laterality: Left

## 2022-01-21 MED ORDER — ROCURONIUM BROMIDE 10 MG/ML (PF) SYRINGE
PREFILLED_SYRINGE | INTRAVENOUS | Status: DC | PRN
  Administered 2022-01-21: 40 mg via INTRAVENOUS

## 2022-01-21 MED ORDER — CHLORHEXIDINE GLUCONATE 4 % EX LIQD: 60.0000 mL | Freq: Once | CUTANEOUS | Status: DC

## 2022-01-21 MED ORDER — BISACODYL 10 MG RE SUPP
10.0000 mg | Freq: Every day | RECTAL | Status: DC | PRN
  Administered 2022-01-23: 10 mg via RECTAL
  Filled 2022-01-21: qty 1

## 2022-01-21 MED ORDER — ACETAMINOPHEN 325 MG PO TABS
325.0000 mg | ORAL_TABLET | Freq: Four times a day (QID) | ORAL | Status: DC | PRN
  Administered 2022-01-22 – 2022-01-23 (×2): 650 mg via ORAL
  Filled 2022-01-21 (×2): qty 2

## 2022-01-21 MED ORDER — ONDANSETRON HCL 4 MG/2ML IJ SOLN
INTRAMUSCULAR | Status: AC
  Filled 2022-01-21: qty 2

## 2022-01-21 MED ORDER — ASPIRIN 81 MG PO CHEW
81.0000 mg | CHEWABLE_TABLET | Freq: Two times a day (BID) | ORAL | Status: DC
  Administered 2022-01-21 – 2022-01-25 (×8): 81 mg via ORAL
  Filled 2022-01-21 (×8): qty 1

## 2022-01-21 MED ORDER — PHENYLEPHRINE HCL-NACL 20-0.9 MG/250ML-% IV SOLN
INTRAVENOUS | Status: DC | PRN
  Administered 2022-01-21: 25 ug/min via INTRAVENOUS

## 2022-01-21 MED ORDER — LACTATED RINGERS IV SOLN: INTRAVENOUS | Status: DC

## 2022-01-21 MED ORDER — PHENOL 1.4 % MT LIQD: 1.0000 | OROMUCOSAL | Status: DC | PRN

## 2022-01-21 MED ORDER — CEFAZOLIN SODIUM-DEXTROSE 2-4 GM/100ML-% IV SOLN: 2.0000 g | INTRAVENOUS | Status: DC

## 2022-01-21 MED ORDER — TRANEXAMIC ACID-NACL 1000-0.7 MG/100ML-% IV SOLN
1000.0000 mg | INTRAVENOUS | Status: AC
  Administered 2022-01-21: 1000 mg via INTRAVENOUS

## 2022-01-21 MED ORDER — MORPHINE SULFATE (PF) 2 MG/ML IV SOLN: 0.5000 mg | INTRAVENOUS | Status: DC | PRN

## 2022-01-21 MED ORDER — HYDROCODONE-ACETAMINOPHEN 5-325 MG PO TABS: 1.0000 | ORAL_TABLET | ORAL | Status: DC | PRN

## 2022-01-21 MED ORDER — FENTANYL CITRATE (PF) 100 MCG/2ML IJ SOLN: 25.0000 ug | INTRAMUSCULAR | Status: DC | PRN

## 2022-01-21 MED ORDER — SUGAMMADEX SODIUM 200 MG/2ML IV SOLN
INTRAVENOUS | Status: DC | PRN
  Administered 2022-01-21: 200 mg via INTRAVENOUS

## 2022-01-21 MED ORDER — POVIDONE-IODINE 10 % EX SWAB
2.0000 | Freq: Once | CUTANEOUS | Status: AC
  Administered 2022-01-21: 2 via TOPICAL

## 2022-01-21 MED ORDER — DEXAMETHASONE SODIUM PHOSPHATE 10 MG/ML IJ SOLN
INTRAMUSCULAR | Status: AC
  Filled 2022-01-21: qty 1

## 2022-01-21 MED ORDER — DEXAMETHASONE SODIUM PHOSPHATE 10 MG/ML IJ SOLN
10.0000 mg | Freq: Once | INTRAMUSCULAR | Status: AC
  Administered 2022-01-22: 10 mg via INTRAVENOUS
  Filled 2022-01-21: qty 1

## 2022-01-21 MED ORDER — LIDOCAINE 2% (20 MG/ML) 5 ML SYRINGE
INTRAMUSCULAR | Status: DC | PRN
  Administered 2022-01-21: 40 mg via INTRAVENOUS

## 2022-01-21 MED ORDER — POVIDONE-IODINE 10 % EX SWAB: 2.0000 | Freq: Once | CUTANEOUS | Status: DC

## 2022-01-21 MED ORDER — ONDANSETRON HCL 4 MG/2ML IJ SOLN: 4.0000 mg | Freq: Four times a day (QID) | INTRAMUSCULAR | Status: DC | PRN

## 2022-01-21 MED ORDER — PHENYLEPHRINE 80 MCG/ML (10ML) SYRINGE FOR IV PUSH (FOR BLOOD PRESSURE SUPPORT)
PREFILLED_SYRINGE | INTRAVENOUS | Status: DC | PRN
  Administered 2022-01-21: 120 ug via INTRAVENOUS
  Administered 2022-01-21: 80 ug via INTRAVENOUS
  Administered 2022-01-21: 120 ug via INTRAVENOUS

## 2022-01-21 MED ORDER — INSULIN ASPART 100 UNIT/ML IJ SOLN: 0.0000 [IU] | INTRAMUSCULAR | Status: DC | PRN

## 2022-01-21 MED ORDER — POLYETHYLENE GLYCOL 3350 17 G PO PACK
17.0000 g | PACK | Freq: Two times a day (BID) | ORAL | Status: DC
  Administered 2022-01-21 – 2022-01-25 (×8): 17 g via ORAL
  Filled 2022-01-21 (×8): qty 1

## 2022-01-21 MED ORDER — PHENYLEPHRINE 80 MCG/ML (10ML) SYRINGE FOR IV PUSH (FOR BLOOD PRESSURE SUPPORT)
PREFILLED_SYRINGE | INTRAVENOUS | Status: AC
  Filled 2022-01-21: qty 10

## 2022-01-21 MED ORDER — CEFAZOLIN SODIUM-DEXTROSE 2-4 GM/100ML-% IV SOLN
INTRAVENOUS | Status: AC
  Filled 2022-01-21: qty 100

## 2022-01-21 MED ORDER — METOCLOPRAMIDE HCL 5 MG/ML IJ SOLN: 5.0000 mg | Freq: Three times a day (TID) | INTRAMUSCULAR | Status: DC | PRN

## 2022-01-21 MED ORDER — METHOCARBAMOL 500 MG PO TABS
500.0000 mg | ORAL_TABLET | Freq: Four times a day (QID) | ORAL | Status: DC | PRN
  Administered 2022-01-23 – 2022-01-24 (×2): 500 mg via ORAL
  Filled 2022-01-21 (×2): qty 1

## 2022-01-21 MED ORDER — ORAL CARE MOUTH RINSE: 15.0000 mL | Freq: Once | OROMUCOSAL | Status: AC

## 2022-01-21 MED ORDER — ACETAMINOPHEN 10 MG/ML IV SOLN
INTRAVENOUS | Status: AC
  Filled 2022-01-21: qty 100

## 2022-01-21 MED ORDER — METOCLOPRAMIDE HCL 5 MG PO TABS: 5.0000 mg | ORAL_TABLET | Freq: Three times a day (TID) | ORAL | Status: DC | PRN

## 2022-01-21 MED ORDER — METHOCARBAMOL 1000 MG/10ML IJ SOLN: 500.0000 mg | Freq: Four times a day (QID) | INTRAVENOUS | Status: DC | PRN

## 2022-01-21 MED ORDER — ACETAMINOPHEN 10 MG/ML IV SOLN
INTRAVENOUS | Status: DC | PRN
  Administered 2022-01-21: 1000 mg via INTRAVENOUS

## 2022-01-21 MED ORDER — TRANEXAMIC ACID-NACL 1000-0.7 MG/100ML-% IV SOLN
1000.0000 mg | Freq: Once | INTRAVENOUS | Status: AC
  Administered 2022-01-21: 1000 mg via INTRAVENOUS
  Filled 2022-01-21: qty 100

## 2022-01-21 MED ORDER — DOCUSATE SODIUM 100 MG PO CAPS
100.0000 mg | ORAL_CAPSULE | Freq: Two times a day (BID) | ORAL | Status: DC
  Administered 2022-01-21 – 2022-01-25 (×8): 100 mg via ORAL
  Filled 2022-01-21 (×8): qty 1

## 2022-01-21 MED ORDER — CHLORHEXIDINE GLUCONATE 0.12 % MT SOLN
OROMUCOSAL | Status: AC
  Administered 2022-01-21: 15 mL via OROMUCOSAL
  Filled 2022-01-21: qty 15

## 2022-01-21 MED ORDER — DEXAMETHASONE SODIUM PHOSPHATE 10 MG/ML IJ SOLN
8.0000 mg | Freq: Once | INTRAMUSCULAR | Status: AC
  Administered 2022-01-21: 8 mg via INTRAVENOUS

## 2022-01-21 MED ORDER — TRANEXAMIC ACID-NACL 1000-0.7 MG/100ML-% IV SOLN
INTRAVENOUS | Status: AC
  Filled 2022-01-21: qty 100

## 2022-01-21 MED ORDER — CEFAZOLIN SODIUM-DEXTROSE 2-4 GM/100ML-% IV SOLN
2.0000 g | INTRAVENOUS | Status: AC
  Administered 2022-01-21: 2 g via INTRAVENOUS

## 2022-01-21 MED ORDER — CHLORHEXIDINE GLUCONATE 0.12 % MT SOLN: 15.0000 mL | Freq: Once | OROMUCOSAL | Status: AC

## 2022-01-21 MED ORDER — SODIUM CHLORIDE 0.9 % IV SOLN: INTRAVENOUS | Status: DC

## 2022-01-21 MED ORDER — ONDANSETRON HCL 4 MG/2ML IJ SOLN
INTRAMUSCULAR | Status: DC | PRN
  Administered 2022-01-21: 4 mg via INTRAVENOUS

## 2022-01-21 MED ORDER — ONDANSETRON HCL 4 MG PO TABS: 4.0000 mg | ORAL_TABLET | Freq: Four times a day (QID) | ORAL | Status: DC | PRN

## 2022-01-21 MED ORDER — FENTANYL CITRATE (PF) 250 MCG/5ML IJ SOLN
INTRAMUSCULAR | Status: DC | PRN
  Administered 2022-01-21: 50 ug via INTRAVENOUS
  Administered 2022-01-21 (×2): 25 ug via INTRAVENOUS

## 2022-01-21 MED ORDER — TRANEXAMIC ACID-NACL 1000-0.7 MG/100ML-% IV SOLN: 1000.0000 mg | INTRAVENOUS | Status: DC

## 2022-01-21 MED ORDER — MENTHOL 3 MG MT LOZG: 1.0000 | LOZENGE | OROMUCOSAL | Status: DC | PRN

## 2022-01-21 MED ORDER — FENTANYL CITRATE (PF) 250 MCG/5ML IJ SOLN
INTRAMUSCULAR | Status: AC
  Filled 2022-01-21: qty 5

## 2022-01-21 MED ORDER — HYDROCODONE-ACETAMINOPHEN 7.5-325 MG PO TABS
1.0000 | ORAL_TABLET | ORAL | Status: DC | PRN
  Administered 2022-01-22 – 2022-01-23 (×2): 1 via ORAL
  Filled 2022-01-21 (×2): qty 1

## 2022-01-21 MED ORDER — DIPHENHYDRAMINE HCL 12.5 MG/5ML PO ELIX: 12.5000 mg | ORAL_SOLUTION | ORAL | Status: DC | PRN

## 2022-01-21 MED ORDER — PROPOFOL 10 MG/ML IV BOLUS
INTRAVENOUS | Status: DC | PRN
  Administered 2022-01-21: 70 mg via INTRAVENOUS

## 2022-01-21 MED ORDER — CEFAZOLIN SODIUM-DEXTROSE 2-4 GM/100ML-% IV SOLN
2.0000 g | Freq: Four times a day (QID) | INTRAVENOUS | Status: AC
  Administered 2022-01-21 – 2022-01-22 (×2): 2 g via INTRAVENOUS
  Filled 2022-01-21 (×2): qty 100

## 2022-01-21 MED ORDER — LIDOCAINE 2% (20 MG/ML) 5 ML SYRINGE
INTRAMUSCULAR | Status: AC
  Filled 2022-01-21: qty 5

## 2022-01-21 MED ORDER — SODIUM CHLORIDE 0.9 % IR SOLN
Status: DC | PRN
  Administered 2022-01-21: 1000 mL

## 2022-01-21 SURGICAL SUPPLY — 60 items
ARTICULEZE HEAD (Hips) ×1 IMPLANT
BAG COUNTER SPONGE SURGICOUNT (BAG) ×1 IMPLANT
BLADE SAW SGTL 18X1.27X75 (BLADE) ×1 IMPLANT
CELLS DAT CNTRL 66122 CELL SVR (MISCELLANEOUS) ×1 IMPLANT
COVER SURGICAL LIGHT HANDLE (MISCELLANEOUS) ×1 IMPLANT
CUP ACETBLR 54 OD PINNACLE (Hips) IMPLANT
DERMABOND ADVANCED .7 DNX12 (GAUZE/BANDAGES/DRESSINGS) ×1 IMPLANT
DRAPE C-ARM 42X72 X-RAY (DRAPES) ×1 IMPLANT
DRAPE IMP U-DRAPE 54X76 (DRAPES) ×1 IMPLANT
DRAPE STERI IOBAN 125X83 (DRAPES) ×1 IMPLANT
DRAPE U-SHAPE 47X51 STRL (DRAPES) ×3 IMPLANT
DRSG AQUACEL AG ADV 3.5X10 (GAUZE/BANDAGES/DRESSINGS) ×1 IMPLANT
DRSG TEGADERM 4X4.75 (GAUZE/BANDAGES/DRESSINGS) ×1 IMPLANT
DURAPREP 26ML APPLICATOR (WOUND CARE) ×1 IMPLANT
ELECT BLADE TIP CTD 4 INCH (ELECTRODE) IMPLANT
ELECT REM PT RETURN 9FT ADLT (ELECTROSURGICAL) ×1
ELECTRODE REM PT RTRN 9FT ADLT (ELECTROSURGICAL) ×1 IMPLANT
EVACUATOR 1/8 PVC DRAIN (DRAIN) ×1 IMPLANT
FACESHIELD WRAPAROUND (MASK) ×1 IMPLANT
FACESHIELD WRAPAROUND OR TEAM (MASK) ×1 IMPLANT
GAUZE SPONGE 2X2 8PLY STRL LF (GAUZE/BANDAGES/DRESSINGS) ×1 IMPLANT
GLOVE BIOGEL PI IND STRL 7.5 (GLOVE) ×3 IMPLANT
GLOVE BIOGEL PI IND STRL 8 (GLOVE) ×1 IMPLANT
GLOVE ECLIPSE 8.0 STRL XLNG CF (GLOVE) ×2 IMPLANT
GLOVE ORTHO TXT STRL SZ7.5 (GLOVE) ×2 IMPLANT
GLOVE SURG ENC MOIS LTX SZ6 (GLOVE) ×1 IMPLANT
GLOVE SURG UNDER POLY LF SZ6.5 (GLOVE) ×2 IMPLANT
GOWN BRE IMP SLV AUR LG STRL (GOWN DISPOSABLE) ×1 IMPLANT
GOWN STRL REIN 3XL XLG LVL4 (GOWN DISPOSABLE) ×1 IMPLANT
GOWN STRL REUS W/ TWL LRG LVL3 (GOWN DISPOSABLE) ×2 IMPLANT
GOWN STRL REUS W/TWL LRG LVL3 (GOWN DISPOSABLE) ×2
HEAD ARTICULEZE (Hips) IMPLANT
KIT BASIN OR (CUSTOM PROCEDURE TRAY) ×1 IMPLANT
KIT TURNOVER KIT B (KITS) ×1 IMPLANT
LINER NEUTRAL 54X36MM PLUS 4 (Hips) IMPLANT
MANIFOLD NEPTUNE II (INSTRUMENTS) ×1 IMPLANT
NS IRRIG 1000ML POUR BTL (IV SOLUTION) ×1 IMPLANT
PACK TOTAL JOINT (CUSTOM PROCEDURE TRAY) ×1 IMPLANT
PACK UNIVERSAL I (CUSTOM PROCEDURE TRAY) ×1 IMPLANT
PAD ARMBOARD 7.5X6 YLW CONV (MISCELLANEOUS) ×2 IMPLANT
RESTRAINT LIMB HOLDER UNIV (RESTRAINTS) ×1 IMPLANT
RETRACTOR WND ALEXIS 18 MED (MISCELLANEOUS) ×1 IMPLANT
RTRCTR WOUND ALEXIS 18CM MED (MISCELLANEOUS) ×1
SCREW 6.5MMX25MM (Screw) IMPLANT
SPONGE T-LAP 4X18 ~~LOC~~+RFID (SPONGE) IMPLANT
STAPLER VISISTAT 35W (STAPLE) ×1 IMPLANT
STEM FEMORAL SZ9 HIGH ACTIS (Stem) IMPLANT
SUCTION FRAZIER HANDLE 10FR (MISCELLANEOUS) ×1
SUCTION TUBE FRAZIER 10FR DISP (MISCELLANEOUS) ×1 IMPLANT
SUT MNCRL AB 4-0 PS2 18 (SUTURE) ×1 IMPLANT
SUT VIC AB 1 CT1 27 (SUTURE) ×2
SUT VIC AB 1 CT1 27XBRD ANBCTR (SUTURE) ×4 IMPLANT
SUT VIC AB 1 CT1 36 (SUTURE) IMPLANT
SUT VIC AB 2-0 CT1 27 (SUTURE) ×2
SUT VIC AB 2-0 CT1 TAPERPNT 27 (SUTURE) ×2 IMPLANT
SUT VLOC 180 0 24IN GS25 (SUTURE) ×1 IMPLANT
TOWEL GREEN STERILE (TOWEL DISPOSABLE) ×1 IMPLANT
TOWEL GREEN STERILE FF (TOWEL DISPOSABLE) ×1 IMPLANT
TUBE SUCT ARGYLE STRL (TUBING) IMPLANT
WATER STERILE IRR 1000ML POUR (IV SOLUTION) ×3 IMPLANT

## 2022-01-21 NOTE — Op Note (Signed)
NAME:  Paul Colon                ACCOUNT NO.: [id number]      MEDICAL RECORD NO.: [id number]      FACILITY:  [facility]      PHYSICIAN:  Shelda Pal  DATE OF BIRTH:  1929/04/02     DATE OF PROCEDURE:  01/21/2022                                 OPERATIVE REPORT         PREOPERATIVE DIAGNOSIS: Left  hip femoral neck fracture   POSTOPERATIVE DIAGNOSIS:  Left hip femoral neck fracture.      PROCEDURE:  Left total hip replacement through an anterior approach   utilizing DePuy THR system, component size 54 mm pinnacle cup, a size 36+4 neutral   Altrex liner, a size 9 Hi Actis stem with a 36+1.5 Articuleze metal head  ball.      SURGEON:  Madlyn Frankel. Charlann Boxer, M.D.      ASSISTANT: Rosalene Billings, PA-C     ANESTHESIA:  General.      SPECIMENS:  None.      COMPLICATIONS:  None.      BLOOD LOSS:  300 cc     DRAINS:  None.      INDICATION OF THE PROCEDURE:  Paul Colon is a 88 y.o. male who presented to the [facility] emergency room after a ground-level fall.  He was in the kitchen when he lost his balance and fell onto his left side.  Due to the inability to bear weight he was brought to the emergency room.  Radiographs revealed a displaced left femoral neck fracture.  Treatment of left total hip replacement was reviewed with his daughter and himself.  Consent was obtained for management of his fracture, to help pain and maintain functional level.  Risks of infection DVT dislocation neurovascular injury as well as risk associated with his health and medical comorbidities reviewed.     PROCEDURE IN DETAIL:  The patient was brought to operative theater.   Once adequate anesthesia, preoperative antibiotics, 2 gm of Ancef, 1 gm of Tranexamic Acid, and 10 mg of Decadron were administered, the patient was positioned supine on the [employer] table.  Once the patient was safely positioned with adequate padding of boney prominences we predraped out the hip, and used fluoroscopy to  confirm orientation of the pelvis.      The left hip was then prepped and draped from proximal iliac crest to   mid thigh with a shower curtain technique.      Time-out was performed identifying the patient, planned procedure, and the appropriate extremity.     An incision was then made 2 cm lateral to the   anterior superior iliac spine extending over the orientation of the   tensor fascia lata muscle and sharp dissection was carried down to the   fascia of the muscle.      The fascia was then incised.  The muscle belly was identified and swept   laterally and retractor placed along the superior neck.  Following   cauterization of the circumflex vessels and removing some pericapsular   fat, a second cobra retractor was placed on the inferior neck.  A T-capsulotomy was made along the line of the   superior neck to the trochanteric fossa, then extended proximally and  distally.  Tag sutures were placed and the retractors were then placed   intracapsular.  We then identified the trochanteric fossa and   orientation of my neck cut and then made a neck osteotomy with the femur on traction.  The femoral   head was removed without difficulty or complication.  Traction was let   off and retractors were placed posterior and anterior around the   acetabulum.      The labrum and foveal tissue were debrided.  I began reaming with a 47 mm   reamer and reamed up to 53 mm reamer with good bony bed preparation and a 54 mm  cup was chosen.  The final 54 mm Pinnacle cup was then impacted under fluoroscopy to confirm the depth of penetration and orientation with respect to   Abduction and forward flexion.  A screw was placed into the ilium followed by the hole eliminator.  The final   36+4 neutral Altrex liner was impacted with good visualized rim fit.  The cup was positioned anatomically within the acetabular portion of the pelvis.      At this point, the femur was rolled to 100 degrees.  Further  capsule was   released off the inferior aspect of the femoral neck.  I then   released the superior capsule proximally.  With the leg in a neutral position the hook was placed laterally   along the femur under the vastus lateralis origin and elevated manually and then held in position using the hook attachment on the bed.  The leg was then extended and adducted with the leg rolled to 100   degrees of external rotation.  Retractors were placed along the medial calcar and posteriorly over the greater trochanter.  Once the proximal femur was fully   exposed, I used a box osteotome to set orientation.  I then began   broaching with the starting chili pepper broach and passed this by hand and then broached up to 9.  With the 9 broach in place I chose a high offset neck and did several trial reductions.  The offset was appropriate, leg lengths   appeared to be equal best matched with the +1.5 head ball trial confirmed radiographically.   Given these findings, I went ahead and dislocated the hip, repositioned all   retractors and positioned the right hip in the extended and abducted position.  The final 9 Hi Actis stem was   chosen and it was impacted down to the level of neck cut.  Based on this   and the trial reductions, a final 36+1.5 Articuleze metal head ball was chosen and   impacted onto a clean and dry trunnion, and the hip was reduced.  The   hip had been irrigated throughout the case again at this point.  I did   reapproximate the superior capsular leaflet to the anterior leaflet   using #1 Vicryl.  The fascia of the   tensor fascia lata muscle was then reapproximated using #1 Vicryl and #0 Stratafix sutures.  The   remaining wound was closed with 2-0 Vicryl and running 4-0 Monocryl.   The hip was cleaned, dried, and dressed sterilely using Dermabond and   Aquacel dressing.  The patient was then brought   to recovery room in stable condition tolerating the procedure well.   Rosalene Billings,  PA-C was present for the entirety of the case involved from   preoperative positioning, perioperative retractor management, general   facilitation of the  case, as well as primary wound closure as assistant.            Madlyn Frankel Charlann Boxer, M.D.        01/21/2022 1:36 PM

## 2022-01-21 NOTE — Anesthesia Preprocedure Evaluation (Addendum)
Anesthesia Evaluation  Patient identified by MRN, date of birth, ID band Patient awake    Reviewed: Allergy & Precautions, NPO status , Patient's Chart, lab work & pertinent test results  Airway Mallampati: I  TM Distance: >3 FB Neck ROM: Full    Dental  (+) Edentulous Upper, Edentulous Lower   Pulmonary neg pulmonary ROS   breath sounds clear to auscultation       Cardiovascular negative cardio ROS  Rhythm:Regular Rate:Normal     Neuro/Psych negative neurological ROS     GI/Hepatic negative GI ROS, Neg liver ROS,,,  Endo/Other  diabetes    Renal/GU Renal disease     Musculoskeletal negative musculoskeletal ROS (+)    Abdominal   Peds  Hematology negative hematology ROS (+)   Anesthesia Other Findings   Reproductive/Obstetrics                             Anesthesia Physical Anesthesia Plan  ASA: 3  Anesthesia Plan: General   Post-op Pain Management:    Induction: Intravenous  PONV Risk Score and Plan: 3 and Ondansetron and Treatment may vary due to age or medical condition  Airway Management Planned: Oral ETT  Additional Equipment: None  Intra-op Plan:   Post-operative Plan: Extubation in OR  Informed Consent: I have reviewed the patients History and Physical, chart, labs and discussed the procedure including the risks, benefits and alternatives for the proposed anesthesia with the patient or authorized representative who has indicated his/her understanding and acceptance.     Consent reviewed with POA  Plan Discussed with: CRNA  Anesthesia Plan Comments:         Anesthesia Quick Evaluation

## 2022-01-21 NOTE — Discharge Instructions (Signed)

## 2022-01-21 NOTE — Plan of Care (Signed)
  Problem: Health Behavior/Discharge Planning: Goal: Ability to manage health-related needs will improve Outcome: Progressing   Problem: Nutritional: Goal: Maintenance of adequate nutrition will improve Outcome: Progressing   Problem: Skin Integrity: Goal: Risk for impaired skin integrity will decrease Outcome: Progressing   Problem: Activity: Goal: Risk for activity intolerance will decrease Outcome: Progressing   

## 2022-01-21 NOTE — Progress Notes (Signed)
Orthopedic Tech Progress Note Patient Details:  Paul Colon [birth year] [id number]  Patient ID: [name], male   DOB: 1929-12-19, 88 y.o.   MRN: [id number] The patient does not meet the criteria for an ohf. No one over 70 can have an ohf. Trinna Post 01/21/2022, 6:23 AM

## 2022-01-21 NOTE — H&P (View-Only) (Signed)
ORTHOPAEDIC CONSULTATION  REQUESTING PHYSICIAN: Leatha Gilding, MD  PCP:  [facility]  Chief Complaint: Fall, left hip pain   HPI: Paul Colon is a 88 y.o. male who presented to [facility] ED after a fall at home yesterday. He complained of left hip pain. Imaging in the ED revealed left femoral neck fracture. Orthopaedics was consulted for evaluation and treatment.  Today, on exam, patient is resting in his hospital bed with his daughter, [name], at the bedside. He lives at home with her at baseline, and she is his caretaker. He has a history of diabetic neuropathy, and lost his balance yesterday causing him to fall. He normally ambulates without assistive devices, but occasionally uses a rollator or cane.   Patient does not take anticoagulants. He is diabetic.   Past Medical History:  Diagnosis Date   Diabetes mellitus without complication (HCC)    Kidney disease    History reviewed. No pertinent surgical history. Social History   Socioeconomic History   Marital status: Widowed    Spouse name: Not on file   Number of children: Not on file   Years of education: Not on file   Highest education level: Not on file  Occupational History   Not on file  Tobacco Use   Smoking status: Never   Smokeless tobacco: Never  Vaping Use   Vaping Use: Never used  Substance and Sexual Activity   Alcohol use: Never   Drug use: Never   Sexual activity: Not on file  Other Topics Concern   Not on file  Social History Narrative   Not on file   Social Determinants of Health   Financial Resource Strain: Not on file  Food Insecurity: No Food Insecurity (01/20/2022)   Hunger Vital Sign    Worried About Running Out of Food in the Last Year: Never true    Ran Out of Food in the Last Year: Never true  Transportation Needs: No Transportation Needs (01/20/2022)   PRAPARE - Administrator, Civil Service (Medical): No    Lack of Transportation (Non-Medical): No  Physical  Activity: Not on file  Stress: Not on file  Social Connections: Not on file   No family history on file. Allergies  Allergen Reactions   Alogliptin Other (See Comments)    Unknown reaction    Penicillin G Rash   Prior to Admission medications   Medication Sig Start Date End Date Taking? Authorizing Provider  aspirin EC 81 MG tablet Take 81 mg by mouth daily. For heart 10/03/21  Yes [provider]  atorvastatin (LIPITOR) 10 MG tablet Take 10 mg by mouth at bedtime. For cholesterol 10/03/21  Yes [provider]  dorzolamide-timolol (COSOPT) 22.3-6.8 MG/ML ophthalmic solution Place 1 drop into both eyes 2 (two) times daily. For glaucoma   Yes [provider]  finasteride (PROSCAR) 5 MG tablet Take 5 mg by mouth daily. For enlarged prostate   Yes [provider]  metFORMIN (GLUCOPHAGE) 500 MG tablet Take 1 tablet (500 mg total) by mouth 2 (two) times daily with a meal. Patient taking differently: Take 500 mg by mouth 2 (two) times daily with a meal. For diabetes -- recheck renal failure every 3 months 04/23/21  Yes Ray, Duwayne Heck, MD  Multiple Vitamins-Minerals (ONE-A-DAY MENS 50+ PO) Take 1 tablet by mouth in the morning.   Yes [provider]  Saw Palmetto, Serenoa repens, (SAW PALMETTO PO) Take 2 capsules by mouth in the morning.  Yes [provider]   DG Hip Unilat W or Wo Pelvis 2-3 Views Left  Result Date: 01/20/2022 CLINICAL DATA:  Fall, pain EXAM: DG HIP (WITH OR WITHOUT PELVIS) 2-3V LEFT COMPARISON:  None Available. FINDINGS: Mildly displaced and impacted transcervical or basicervical fracture of the left femoral neck. Soft tissues unremarkable. IMPRESSION: Mildly displaced and impacted transcervical or basicervical fracture of the left femoral neck. Electronically Signed   By: Delanna Ahmadi M.D.   On: 01/20/2022 13:25   DG Chest Portable 1 View  Result Date: 01/20/2022 CLINICAL DATA:  Tripped and fell.  Left hip pain. EXAM:  PORTABLE CHEST 1 VIEW COMPARISON:  04/23/2021 FINDINGS: Heart size is normal. Mild aortic tortuosity. The lungs are clear. The vascularity is normal. No effusions. No abnormal bone finding. IMPRESSION: No active disease. Mild aortic tortuosity. Electronically Signed   By: Nelson Chimes M.D.   On: 01/20/2022 12:15   DG Pelvis 1-2 Views  Result Date: 01/20/2022 CLINICAL DATA:  Tripped and fell.  Left hip pain. EXAM: PELVIS - 1-2 VIEW COMPARISON:  None Available. FINDINGS: Probable femoral neck fracture on the left. Recommend specific hip radiography. No other fracture the pelvic region bones. IMPRESSION: Probable femoral neck fracture on the left. Recommend specific hip radiography. Electronically Signed   By: Nelson Chimes M.D.   On: 01/20/2022 12:15    Positive ROS: All other systems have been reviewed and were otherwise negative with the exception of those mentioned in the HPI and as above.  Physical Exam: General: Alert, no acute distress Cardiovascular: No pedal edema Respiratory: No cyanosis, no use of accessory musculature Skin: No lesions in the area of chief complaint Neurologic: Sensation intact distally Psychiatric: Patient is competent for consent with normal mood and affect   MUSCULOSKELETAL:  Left Hip Exam: No abrasions, skin lesions ROM deferred due to known fracture Able to dorsiflex his foot  Sensation intact, although diminished due to underlying neuropathy  Assessment: Left femoral neck fracture  Plan: Discussed with daughter and patient the diagnosis of left femoral neck fracture, and planned treatment with left total hip arthroplasty. Patient will be seen by the hospitalist for pre-operative risk evaluation and clearance for surgery.   NPO for now. Plan for surgery this afternoon pending medical clearance.    Irving Copas, PA-C Cell [phone]   01/21/2022 9:09 AM

## 2022-01-21 NOTE — Consult Note (Signed)
ORTHOPAEDIC CONSULTATION  REQUESTING PHYSICIAN: Leatha Gilding, MD  PCP:  [facility]  Chief Complaint: Fall, left hip pain   HPI: Paul Colon is a 88 y.o. male who presented to [facility] ED after a fall at home yesterday. He complained of left hip pain. Imaging in the ED revealed left femoral neck fracture. Orthopaedics was consulted for evaluation and treatment.  Today, on exam, patient is resting in his hospital bed with his daughter, Paul Colon, at the bedside. He lives at home with her at baseline, and she is his caretaker. He has a history of diabetic neuropathy, and lost his balance yesterday causing him to fall. He normally ambulates without assistive devices, but occasionally uses a rollator or cane.   Patient does not take anticoagulants. He is diabetic.   Past Medical History:  Diagnosis Date   Diabetes mellitus without complication (HCC)    Kidney disease    History reviewed. No pertinent surgical history. Social History   Socioeconomic History   Marital status: Widowed    Spouse name: Not on file   Number of children: Not on file   Years of education: Not on file   Highest education level: Not on file  Occupational History   Not on file  Tobacco Use   Smoking status: Never   Smokeless tobacco: Never  Vaping Use   Vaping Use: Never used  Substance and Sexual Activity   Alcohol use: Never   Drug use: Never   Sexual activity: Not on file  Other Topics Concern   Not on file  Social History Narrative   Not on file   Social Determinants of Health   Financial Resource Strain: Not on file  Food Insecurity: No Food Insecurity (01/20/2022)   Hunger Vital Sign    Worried About Running Out of Food in the Last Year: Never true    Ran Out of Food in the Last Year: Never true  Transportation Needs: No Transportation Needs (01/20/2022)   PRAPARE - Administrator, Civil Service (Medical): No    Lack of Transportation (Non-Medical): No  Physical  Activity: Not on file  Stress: Not on file  Social Connections: Not on file   No family history on file. Allergies  Allergen Reactions   Alogliptin Other (See Comments)    Unknown reaction    Penicillin G Rash   Prior to Admission medications   Medication Sig Start Date End Date Taking? Authorizing Provider  aspirin EC 81 MG tablet Take 81 mg by mouth daily. For heart 10/03/21  Yes [provider]  atorvastatin (LIPITOR) 10 MG tablet Take 10 mg by mouth at bedtime. For cholesterol 10/03/21  Yes [provider]  dorzolamide-timolol (COSOPT) 22.3-6.8 MG/ML ophthalmic solution Place 1 drop into both eyes 2 (two) times daily. For glaucoma   Yes [provider]  finasteride (PROSCAR) 5 MG tablet Take 5 mg by mouth daily. For enlarged prostate   Yes [provider]  metFORMIN (GLUCOPHAGE) 500 MG tablet Take 1 tablet (500 mg total) by mouth 2 (two) times daily with a meal. Patient taking differently: Take 500 mg by mouth 2 (two) times daily with a meal. For diabetes -- recheck renal failure every 3 months 04/23/21  Yes Ray, Duwayne Heck, MD  Multiple Vitamins-Minerals (ONE-A-DAY MENS 50+ PO) Take 1 tablet by mouth in the morning.   Yes [provider]  Saw Palmetto, Serenoa repens, (SAW PALMETTO PO) Take 2 capsules by mouth in the morning.  Yes [provider]   DG Hip Unilat W or Wo Pelvis 2-3 Views Left  Result Date: 01/20/2022 CLINICAL DATA:  Fall, pain EXAM: DG HIP (WITH OR WITHOUT PELVIS) 2-3V LEFT COMPARISON:  None Available. FINDINGS: Mildly displaced and impacted transcervical or basicervical fracture of the left femoral neck. Soft tissues unremarkable. IMPRESSION: Mildly displaced and impacted transcervical or basicervical fracture of the left femoral neck. Electronically Signed   By: Alex D Bibbey M.D.   On: 01/20/2022 13:25   DG Chest Portable 1 View  Result Date: 01/20/2022 CLINICAL DATA:  Tripped and fell.  Left hip pain. EXAM:  PORTABLE CHEST 1 VIEW COMPARISON:  04/23/2021 FINDINGS: Heart size is normal. Mild aortic tortuosity. The lungs are clear. The vascularity is normal. No effusions. No abnormal bone finding. IMPRESSION: No active disease. Mild aortic tortuosity. Electronically Signed   By: Mark  Shogry M.D.   On: 01/20/2022 12:15   DG Pelvis 1-2 Views  Result Date: 01/20/2022 CLINICAL DATA:  Tripped and fell.  Left hip pain. EXAM: PELVIS - 1-2 VIEW COMPARISON:  None Available. FINDINGS: Probable femoral neck fracture on the left. Recommend specific hip radiography. No other fracture the pelvic region bones. IMPRESSION: Probable femoral neck fracture on the left. Recommend specific hip radiography. Electronically Signed   By: Mark  Shogry M.D.   On: 01/20/2022 12:15    Positive ROS: All other systems have been reviewed and were otherwise negative with the exception of those mentioned in the HPI and as above.  Physical Exam: General: Alert, no acute distress Cardiovascular: No pedal edema Respiratory: No cyanosis, no use of accessory musculature Skin: No lesions in the area of chief complaint Neurologic: Sensation intact distally Psychiatric: Patient is competent for consent with normal mood and affect   MUSCULOSKELETAL:  Left Hip Exam: No abrasions, skin lesions ROM deferred due to known fracture Able to dorsiflex his foot  Sensation intact, although diminished due to underlying neuropathy  Assessment: Left femoral neck fracture  Plan: Discussed with daughter and patient the diagnosis of left femoral neck fracture, and planned treatment with left total hip arthroplasty. Patient will be seen by the hospitalist for pre-operative risk evaluation and clearance for surgery.   NPO for now. Plan for surgery this afternoon pending medical clearance.    Arturo Sofranko R Derrius Furtick, PA-C Cell [phone]   01/21/2022 9:09 AM  

## 2022-01-21 NOTE — Transfer of Care (Signed)
Immediate Anesthesia Transfer of Care Note  Patient: Paul Colon  Procedure(s) Performed: TOTAL HIP ARTHROPLASTY ANTERIOR APPROACH (Left: Hip)  Patient Location: PACU  Anesthesia Type:General  Level of Consciousness: drowsy  Airway & Oxygen Therapy: Patient Spontanous Breathing  Post-op Assessment: Report given to RN and Post -op Vital signs reviewed and stable  Post vital signs: Reviewed and stable  Last Vitals:  Vitals Value Taken Time  BP 139/61 01/21/22 1524  Temp    Pulse 67 01/21/22 1527  Resp 22 01/21/22 1527  SpO2 100 % 01/21/22 1527  Vitals shown include unvalidated device data.  Last Pain:  Vitals:   01/21/22 1220  TempSrc: Oral  PainSc:          Complications: No notable events documented.

## 2022-01-21 NOTE — Progress Notes (Signed)
PROGRESS NOTE  Paul Colon TDD:[id number] DOB: [birth year] DOA: 01/20/2022 PCP: [facility]   LOS: 1 day   Brief Narrative / Interim history: 88 year old male with history of DM2, neuropathy, CKD 3 comes into the hospital after having a mechanical fall at home.  At baseline patient ambulates without assistance, sometimes uses a walker when he goes out.  He is occasionally unsteady on his feet due to neuropathy.  Subjective / 24h Interval events: Doing well this morning, denies any chest pain, denies any shortness of breath  Assesement and Plan: Principal Problem:   Fracture of femoral neck, left, closed (HCC) Active Problems:   Fall at home, initial encounter   AKI (acute kidney injury) (HCC)   Uncontrolled type 2 diabetes mellitus with hyperglycemia, without long-term current use of insulin (HCC)   BPH (benign prostatic hyperplasia)   Hyperlipidemia  Principal problem Left femoral neck fracture secondary to fall - orthopedic surgery consulted, appreciate input.  In terms of preoperative risk assessment, RCRI is about 3.9% and Chales Abrahams perioperative is about 3.4%.  He has no history of congestive heart failure, no history of fluid overload/lower extremity edema, no anginal symptoms and prior to this he was in his normal state of health.  Okay with proceeding with surgery understanding the risk above -Daughter tells me he had a cardiac MRI, and I think that was done due to some unexplained dizziness, he had an echo but denies having congestive heart failure being diagnosed with it.  Regardless, he appears compensated.  Obtain a 2D echo  Active problems Acute kidney injury -creatinine 1.5 on admission, has received fluids and improved to 1.3.  Strongly suspect a degree of chronic kidney disease  BPH-continue Flomax  Hyperlipidemia-continue statin  DM2, with hyperglycemia-has been placed on sliding scale, continue.  A1c is 7.3 which is acceptable at his age group  Lab Results   Component Value Date   HGBA1C 7.3 (H) 09/06/2021   CBG (last 3)  Recent Labs    01/20/22 1956 01/21/22 0816  GLUCAP 246* 175*    Scheduled Meds:  atorvastatin  20 mg Oral QPM   chlorhexidine  60 mL Topical Once   insulin aspart  0-6 Units Subcutaneous TID WC   povidone-iodine  2 Application Topical Once   senna  1 tablet Oral BID   senna-docusate  1 tablet Oral QHS   tamsulosin  0.4 mg Oral QPC supper   Continuous Infusions:  sodium chloride      ceFAZolin (ANCEF) IV     tranexamic acid     PRN Meds:.HYDROcodone-acetaminophen, morphine injection, oxyCODONE  Current Outpatient Medications  Medication Instructions   aspirin EC 81 mg, Oral, Daily, For heart   atorvastatin (LIPITOR) 10 mg, Oral, Nightly, For cholesterol   dorzolamide-timolol (COSOPT) 22.3-6.8 MG/ML ophthalmic solution 1 drop, Both Eyes, 2 times daily, For glaucoma   finasteride (PROSCAR) 5 mg, Oral, Daily, For enlarged prostate   metFORMIN (GLUCOPHAGE) 500 mg, Oral, 2 times daily with meals   Multiple Vitamins-Minerals (ONE-A-DAY MENS 50+ PO) 1 tablet, Oral, Every morning   Saw Palmetto, Serenoa repens, (SAW PALMETTO PO) 2 capsules, Oral, Every morning    Diet Orders (From admission, onward)     Start     Ordered   01/22/22 0430  Diet NPO time specified  Diet effective ____        01/21/22 0920   01/21/22 0430  Diet NPO time specified  Diet effective ____        01/20/22 2232  DVT prophylaxis:    Lab Results  Component Value Date   PLT 176 01/21/2022      Code Status: Full Code  Family Communication: Daughter present at bedside  Status is: Inpatient  Remains inpatient appropriate because: OR today   Level of care: Telemetry Surgical  Consultants:  Orthopedic surgery  Objective: Vitals:   01/20/22 1940 01/20/22 1945 01/20/22 2129 01/21/22 0357  BP:   136/61 132/65  Pulse: 71 74 71 66  Resp: 18 19 (!) 21 19  Temp:   98.2 F (36.8 C)   TempSrc:   Oral   SpO2: 96%  97% 99% 97%  Weight:      Height:        Intake/Output Summary (Last 24 hours) at 01/21/2022 0957 Last data filed at 01/21/2022 0500 Gross per 24 hour  Intake 561.25 ml  Output --  Net 561.25 ml   Wt Readings from Last 3 Encounters:  01/20/22 68.9 kg  11/10/21 68.9 kg  09/06/21 70.3 kg    Examination:  Constitutional: NAD Eyes: no scleral icterus ENMT: Mucous membranes are moist.  Neck: normal, supple Respiratory: clear to auscultation bilaterally, no wheezing, no crackles. Normal respiratory effort. No accessory muscle use.  Cardiovascular: Regular rate and rhythm, no murmurs / rubs / gallops. No LE edema.  Abdomen: non distended, no tenderness. Bowel sounds positive.  Musculoskeletal: no clubbing / cyanosis.  Skin: no rashes Neurologic: non focal   Data Reviewed: I have independently reviewed following labs and imaging studies   CBC Recent Labs  Lab 01/20/22 1135 01/21/22 0338  WBC 9.0 8.2  HGB 11.8* 11.1*  HCT 35.0* 33.5*  PLT 220 176  MCV 92.3 93.3  MCH 31.1 30.9  MCHC 33.7 33.1  RDW 13.0 13.0  LYMPHSABS 0.9  --   MONOABS 0.7  --   EOSABS 0.2  --   BASOSABS 0.0  --     Recent Labs  Lab 01/20/22 1135 01/21/22 0338  NA 135 134*  K 4.6 4.3  CL 102 99  CO2 25 27  GLUCOSE 281* 210*  BUN 29* 20  CREATININE 1.51* 1.31*  CALCIUM 9.1 8.7*    ------------------------------------------------------------------------------------------------------------------ No results for input(s): "CHOL", "HDL", "LDLCALC", "TRIG", "CHOLHDL", "LDLDIRECT" in the last 72 hours.  Lab Results  Component Value Date   HGBA1C 7.3 (H) 09/06/2021   ------------------------------------------------------------------------------------------------------------------ No results for input(s): "TSH", "T4TOTAL", "T3FREE", "THYROIDAB" in the last 72 hours.  Invalid input(s): "FREET3"  Cardiac Enzymes No results for input(s): "CKMB", "TROPONINI", "MYOGLOBIN" in the last 168  hours.  Invalid input(s): "CK" ------------------------------------------------------------------------------------------------------------------ No results found for: "BNP"  CBG: Recent Labs  Lab 01/20/22 1956 01/21/22 0816  GLUCAP 246* 175*    Recent Results (from the past 240 hour(s))  Surgical pcr screen     Status: None   Collection Time: 01/21/22  5:58 AM   Specimen: Nasal Mucosa; Nasal Swab  Result Value Ref Range Status   MRSA, PCR NEGATIVE NEGATIVE Final   Staphylococcus aureus NEGATIVE NEGATIVE Final    Comment: (NOTE) The Xpert SA Assay (FDA approved for NASAL specimens in patients 70 years of age and older), is one component of a comprehensive surveillance program. It is not intended to diagnose infection nor to guide or monitor treatment. Performed at [facility], [address] [place], [place] [address]      Radiology Studies: DG Hip Lucienne Capers or Wo Pelvis 2-3 Views Left  Result Date: 01/20/2022 CLINICAL DATA:  Fall, pain EXAM: DG  HIP (WITH OR WITHOUT PELVIS) 2-3V LEFT COMPARISON:  None Available. FINDINGS: Mildly displaced and impacted transcervical or basicervical fracture of the left femoral neck. Soft tissues unremarkable. IMPRESSION: Mildly displaced and impacted transcervical or basicervical fracture of the left femoral neck. Electronically Signed   By: Jearld Lesch M.D.   On: 01/20/2022 13:25   DG Chest Portable 1 View  Result Date: 01/20/2022 CLINICAL DATA:  Tripped and fell.  Left hip pain. EXAM: PORTABLE CHEST 1 VIEW COMPARISON:  04/23/2021 FINDINGS: Heart size is normal. Mild aortic tortuosity. The lungs are clear. The vascularity is normal. No effusions. No abnormal bone finding. IMPRESSION: No active disease. Mild aortic tortuosity. Electronically Signed   By: Paulina Fusi M.D.   On: 01/20/2022 12:15   DG Pelvis 1-2 Views  Result Date: 01/20/2022 CLINICAL DATA:  Tripped and fell.  Left hip pain. EXAM: PELVIS - 1-2 VIEW COMPARISON:  None  Available. FINDINGS: Probable femoral neck fracture on the left. Recommend specific hip radiography. No other fracture the pelvic region bones. IMPRESSION: Probable femoral neck fracture on the left. Recommend specific hip radiography. Electronically Signed   By: Paulina Fusi M.D.   On: 01/20/2022 12:15     Pamella Pert, MD, PhD [facility]  Between 7 am - 7 pm I am available, please contact me via Amion (for emergencies) or Securechat (non urgent messages)  Between 7 pm - 7 am I am not available, please contact night coverage MD/APP via Amion

## 2022-01-21 NOTE — Plan of Care (Signed)
  Problem: Health Behavior/Discharge Planning: Goal: Ability to manage health-related needs will improve Outcome: Progressing   Problem: Nutritional: Goal: Maintenance of adequate nutrition will improve Outcome: Progressing   Problem: Skin Integrity: Goal: Risk for impaired skin integrity will decrease Outcome: Progressing   Problem: Education: Goal: Knowledge of General Education information will improve Description: Including pain rating scale, medication(s)/side effects and non-pharmacologic comfort measures Outcome: Progressing

## 2022-01-21 NOTE — Anesthesia Postprocedure Evaluation (Signed)
Anesthesia Post Note  Patient: Thermon Zulauf  Procedure(s) Performed: TOTAL HIP ARTHROPLASTY ANTERIOR APPROACH (Left: Hip)     Patient location during evaluation: PACU Anesthesia Type: General Level of consciousness: awake and alert Pain management: pain level controlled Vital Signs Assessment: post-procedure vital signs reviewed and stable Respiratory status: spontaneous breathing, nonlabored ventilation, respiratory function stable and patient connected to nasal cannula oxygen Cardiovascular status: blood pressure returned to baseline and stable Postop Assessment: no apparent nausea or vomiting Anesthetic complications: no   No notable events documented.  Last Vitals:  Vitals:   01/21/22 1545 01/21/22 1600  BP: (!) 141/57 130/63  Pulse: 65 67  Resp: 14 15  Temp:  36.7 C  SpO2: 100% 99%    Last Pain:  Vitals:   01/21/22 1545  TempSrc:   PainSc: 0-No pain                 Shelton Silvas

## 2022-01-21 NOTE — Anesthesia Procedure Notes (Signed)
Procedure Name: Intubation Date/Time: 01/21/2022 1:57 PM  Performed by: Carolan Clines, CRNAPre-anesthesia Checklist: Patient identified, Emergency Drugs available, Suction available and Patient being monitored Patient Re-evaluated:Patient Re-evaluated prior to induction Oxygen Delivery Method: Circle System Utilized Preoxygenation: Pre-oxygenation with 100% oxygen Induction Type: IV induction Ventilation: Mask ventilation without difficulty and Oral airway inserted - appropriate to patient size Laryngoscope Size: Mac and 4 Grade View: Grade I Tube type: Oral Tube size: 7.5 mm Number of attempts: 1 Airway Equipment and Method: Stylet and Oral airway Placement Confirmation: ETT inserted through vocal cords under direct vision, positive ETCO2 and breath sounds checked- equal and bilateral Secured at: 22 cm Tube secured with: Tape Dental Injury: Teeth and Oropharynx as per pre-operative assessment

## 2022-01-21 NOTE — Interval H&P Note (Signed)
History and Physical Interval Note:  01/21/2022 1:35 PM  Paul Colon  has presented today for surgery, with the diagnosis of Left Femoral Neck Fx.  The various methods of treatment have been discussed with the patient and family. After consideration of risks, benefits and other options for treatment, the patient has consented to  Procedure(s): TOTAL HIP ARTHROPLASTY ANTERIOR APPROACH (Left) as a surgical intervention.  The patient's history has been reviewed, patient examined, no change in status, stable for surgery.  I have reviewed the patient's chart and labs.  Questions were answered to the patient's satisfaction.     Shelda Pal

## 2022-01-22 ENCOUNTER — Inpatient Hospital Stay (HOSPITAL_COMMUNITY): Payer: No Typology Code available for payment source

## 2022-01-22 DIAGNOSIS — Z96642 Presence of left artificial hip joint: Secondary | ICD-10-CM

## 2022-01-22 DIAGNOSIS — R0609 Other forms of dyspnea: Secondary | ICD-10-CM

## 2022-01-22 LAB — COMPREHENSIVE METABOLIC PANEL
ALT: 13 U/L (ref 0–44)
AST: 17 U/L (ref 15–41)
Albumin: 2.6 g/dL — ABNORMAL LOW (ref 3.5–5.0)
Alkaline Phosphatase: 48 U/L (ref 38–126)
Anion gap: 10 (ref 5–15)
BUN: 22 mg/dL (ref 8–23)
CO2: 23 mmol/L (ref 22–32)
Calcium: 8.1 mg/dL — ABNORMAL LOW (ref 8.9–10.3)
Chloride: 99 mmol/L (ref 98–111)
Creatinine, Ser: 1.45 mg/dL — ABNORMAL HIGH (ref 0.61–1.24)
GFR, Estimated: 46 mL/min — ABNORMAL LOW (ref >60)
Glucose, Bld: 362 mg/dL — ABNORMAL HIGH (ref 70–99)
Potassium: 4.5 mmol/L (ref 3.5–5.1)
Sodium: 132 mmol/L — ABNORMAL LOW (ref 135–145)
Total Bilirubin: 0.4 mg/dL (ref 0.3–1.2)
Total Protein: 5.3 g/dL — ABNORMAL LOW (ref 6.5–8.1)

## 2022-01-22 LAB — CBC
HCT: 28.8 % — ABNORMAL LOW (ref 39.0–52.0)
Hemoglobin: 9.7 g/dL — ABNORMAL LOW (ref 13.0–17.0)
MCH: 31.1 pg (ref 26.0–34.0)
MCHC: 33.7 g/dL (ref 30.0–36.0)
MCV: 92.3 fL (ref 80.0–100.0)
Platelets: 165 10*3/uL (ref 150–400)
RBC: 3.12 MIL/uL — ABNORMAL LOW (ref 4.22–5.81)
RDW: 12.7 % (ref 11.5–15.5)
WBC: 10.1 10*3/uL (ref 4.0–10.5)
nRBC: 0 % (ref 0.0–0.2)

## 2022-01-22 LAB — GLUCOSE, CAPILLARY
Glucose-Capillary: 304 mg/dL — ABNORMAL HIGH (ref 70–99)
Glucose-Capillary: 327 mg/dL — ABNORMAL HIGH (ref 70–99)
Glucose-Capillary: 346 mg/dL — ABNORMAL HIGH (ref 70–99)
Glucose-Capillary: 401 mg/dL — ABNORMAL HIGH (ref 70–99)
Glucose-Capillary: 420 mg/dL — ABNORMAL HIGH (ref 70–99)

## 2022-01-22 LAB — ECHOCARDIOGRAM COMPLETE
AR max vel: 2.52 cm2
AV Area VTI: 2.42 cm2
AV Area mean vel: 2.47 cm2
AV Mean grad: 4.5 mmHg
AV Peak grad: 8.1 mmHg
Ao pk vel: 1.42 m/s
Area-P 1/2: 3.48 cm2
Height: 70.984 in
S' Lateral: 2.3 cm
Weight: 2430.35 oz

## 2022-01-22 LAB — MAGNESIUM: Magnesium: 1.7 mg/dL (ref 1.7–2.4)

## 2022-01-22 MED ORDER — TRAMADOL HCL 50 MG PO TABS
50.0000 mg | ORAL_TABLET | Freq: Four times a day (QID) | ORAL | Status: DC | PRN
  Administered 2022-01-22: 50 mg via ORAL
  Filled 2022-01-22 (×3): qty 1

## 2022-01-22 MED ORDER — INSULIN ASPART 100 UNIT/ML IJ SOLN
5.0000 [IU] | Freq: Once | INTRAMUSCULAR | Status: AC
  Administered 2022-01-22: 5 [IU] via SUBCUTANEOUS

## 2022-01-22 MED ORDER — FINASTERIDE 5 MG PO TABS
5.0000 mg | ORAL_TABLET | Freq: Every day | ORAL | Status: DC
  Administered 2022-01-22: 5 mg via ORAL
  Filled 2022-01-22 (×3): qty 1

## 2022-01-22 MED ORDER — INSULIN GLARGINE-YFGN 100 UNIT/ML ~~LOC~~ SOLN
8.0000 [IU] | Freq: Every day | SUBCUTANEOUS | Status: DC
  Administered 2022-01-22 – 2022-01-23 (×2): 8 [IU] via SUBCUTANEOUS
  Filled 2022-01-22 (×2): qty 0.08

## 2022-01-22 NOTE — Evaluation (Signed)
Physical Therapy Evaluation  Patient Details Name: Paul Colon MRN: [id number] DOB: [birth year] Today's Date: 01/22/2022  History of Present Illness  Pt is a 88 y/o male who presents 01/20/22 s/p fall at home, sustaining a L femoral neck fracture. He is now s/p L THA on 01/21/2022. PMH significant for DM, kidney disease.   Clinical Impression  Pt admitted with above diagnosis. Pt currently with functional limitations due to the deficits listed below (see PT Problem List). At the time of PT eval pt was able to perform transfers and ambulation with up to +2 mod assist and RW for support. Mobility limited by orthostatic hypotension. See gait details for more info. Pt and daughter would prefer for pt to return home at d/c. Anticipate pt will progress well when BP is stable. Acutely, pt will benefit from skilled PT to increase their independence and safety with mobility to allow discharge to the venue listed below.          Recommendations for follow up therapy are one component of a multi-disciplinary discharge planning process, led by the attending physician.  Recommendations may be updated based on patient status, additional functional criteria and insurance authorization.  Follow Up Recommendations Home health PT      Assistance Recommended at Discharge Frequent or constant Supervision/Assistance  Patient can return home with the following  A little help with walking and/or transfers;A little help with bathing/dressing/bathroom;Assistance with cooking/housework;Assist for transportation;Help with stairs or ramp for entrance    Equipment Recommendations Rolling walker (2 wheels);BSC/3in1  Recommendations for Other Services       Functional Status Assessment Patient has had a recent decline in their functional status and demonstrates the ability to make significant improvements in function in a reasonable and predictable amount of time.     Precautions / Restrictions  Precautions Precautions: Fall Precaution Comments: watch BP Restrictions Weight Bearing Restrictions: Yes LLE Weight Bearing: Weight bearing as tolerated      Mobility  Bed Mobility Overal bed mobility: Needs Assistance Bed Mobility: Supine to Sit     Supine to sit: Mod assist, +2 for physical assistance, HOB elevated     General bed mobility comments: Assist for LE advancement towards EOB. VC's for use of rails for support. Posterior support provided for trunk control as he transitioned to full sitting position at EOB.    Transfers Overall transfer level: Needs assistance Equipment used: Rolling walker (2 wheels) Transfers: Sit to/from Stand Sit to Stand: Min assist, +2 physical assistance, +2 safety/equipment, From elevated surface           General transfer comment: VC's for hand placement on seated surface for safety. +2 assist for power up to full stand and to gain/maintain standing balance.    Ambulation/Gait Ambulation/Gait assistance: Min assist, +2 safety/equipment Gait Distance (Feet): 5 Feet Assistive device: Rolling walker (2 wheels) Gait Pattern/deviations: Step-to pattern, Decreased stride length, Shuffle, Trunk flexed Gait velocity: Decreased Gait velocity interpretation: <1.31 ft/sec, indicative of household ambulator   General Gait Details: Slow and guarded with difficulty advancing feet. Pt motivated to progress however states he feels dizzy. Upon seated rest break BP dropped from 125/56 to 76/43. Gait training ended.  Stairs            Wheelchair Mobility    Modified Rankin (Stroke Patients Only)       Balance Overall balance assessment: Needs assistance Sitting-balance support: Single extremity supported, Feet supported Sitting balance-Leahy Scale: Fair     Standing balance support: Bilateral upper extremity  supported, Reliant on assistive device for balance, During functional activity Standing balance-Leahy Scale: Poor                                Pertinent Vitals/Pain Pain Assessment Pain Assessment: Faces Faces Pain Scale: Hurts even more Pain Location: L hip during transition to EOB. Pain Intervention(s): Limited activity within patient's tolerance, Monitored during session, Repositioned    Home Living Family/patient expects to be discharged to:: Private residence Living Arrangements: Children Available Help at Discharge: Family;Available 24 hours/day   Home Access: Stairs to enter Entrance Stairs-Rails: Can reach both Entrance Stairs-Number of Steps: 3 Alternate Level Stairs-Number of Steps: flight Home Layout: Two level;Able to live on main level with bedroom/bathroom;Other (Comment) Home Equipment: Rollator (4 wheels);Cane - single point;Shower seat      Prior Function Prior Level of Function : Needs assist             Mobility Comments: Mostly without AD use, however rollator utilized for longer distance mobility and SPC for nighttime bathroom trips (3-6 times a night). ADLs Comments: family helps him bathe and with IADLs     Hand Dominance   Dominant Hand: Right    Extremity/Trunk Assessment   Upper Extremity Assessment Upper Extremity Assessment: Defer to OT evaluation    Lower Extremity Assessment Lower Extremity Assessment: LLE deficits/detail LLE Deficits / Details: Acute pain, decreased strength and AROM consistent with above mentioned injury and surgery. LLE: Unable to fully assess due to pain    Cervical / Trunk Assessment Cervical / Trunk Assessment: Kyphotic  Communication   Communication: HOH  Cognition Arousal/Alertness: Awake/alert Behavior During Therapy: WFL for tasks assessed/performed Overall Cognitive Status: Within Functional Limits for tasks assessed                                          General Comments      Exercises General Exercises - Lower Extremity Ankle Circles/Pumps: 10 reps Long Arc Quad: 10 reps    Assessment/Plan    PT Assessment Patient needs continued PT services  PT Problem List Decreased strength;Decreased activity tolerance;Decreased balance;Decreased range of motion;Decreased mobility;Decreased knowledge of use of DME;Decreased safety awareness;Decreased knowledge of precautions;Pain       PT Treatment Interventions DME instruction;Gait training;Stair training;Functional mobility training;Therapeutic activities;Therapeutic exercise;Balance training;Patient/family education    PT Goals (Current goals can be found in the Care Plan section)  Acute Rehab PT Goals Patient Stated Goal: Home with daughter at d/c PT Goal Formulation: With patient/family Time For Goal Achievement: 01/29/22 Potential to Achieve Goals: Good    Frequency 7X/week     Co-evaluation PT/OT/SLP Co-Evaluation/Treatment: Yes Reason for Co-Treatment: For patient/therapist safety;To address functional/ADL transfers PT goals addressed during session: Mobility/safety with mobility;Balance;Proper use of DME;Strengthening/ROM         AM-PAC PT "6 Clicks" Mobility  Outcome Measure Help needed turning from your back to your side while in a flat bed without using bedrails?: A Lot Help needed moving from lying on your back to sitting on the side of a flat bed without using bedrails?: A Lot Help needed moving to and from a bed to a chair (including a wheelchair)?: A Lot Help needed standing up from a chair using your arms (e.g., wheelchair or bedside chair)?: A Lot Help needed to walk in hospital room?: A Little Help needed  climbing 3-5 steps with a railing? : Total 6 Click Score: 12    End of Session Equipment Utilized During Treatment: Gait belt Activity Tolerance: Patient tolerated treatment well Patient left: in chair;with call bell/phone within reach;with chair alarm set;with family/visitor present Nurse Communication: Mobility status PT Visit Diagnosis: Unsteadiness on feet (R26.81);Pain Pain -  Right/Left: Left Pain - part of body: Hip    Time: 0277-4128 PT Time Calculation (min) (ACUTE ONLY): 43 min   Charges:   PT Evaluation $PT Eval Moderate Complexity: 1 Mod PT Treatments $Gait Training: 8-22 mins        Conni Slipper, PT, DPT Acute Rehabilitation Services Secure Chat Preferred Office: [phone]   Marylynn Pearson 01/22/2022, 12:53 PM

## 2022-01-22 NOTE — Progress Notes (Incomplete)
Echocardiogram 2D Echocardiogram has been performed.  Paul Colon 01/22/2022, 3:26 PM

## 2022-01-22 NOTE — Progress Notes (Signed)
PROGRESS NOTE  Paul Colon XLK:[id number] DOB: 12/03/1929 DOA: 01/20/2022 PCP: [facility]   LOS: 2 days   Brief Narrative / Interim history: 88 year old male with history of DM2, neuropathy, CKD 3 comes into the hospital after having a mechanical fall at home.  At baseline patient ambulates without assistance, sometimes uses a walker when he goes out.  He is occasionally unsteady on his feet due to neuropathy.  Subjective / 24h Interval events: Doing well.  No chest pain, no shortness of breath.  Assesement and Plan: Principal Problem:   Fracture of femoral neck, left, closed (HCC) Active Problems:   Fall at home, initial encounter   AKI (acute kidney injury) (HCC)   Uncontrolled type 2 diabetes mellitus with hyperglycemia, without long-term current use of insulin (HCC)   BPH (benign prostatic hyperplasia)   Hyperlipidemia   S/P total left hip arthroplasty  Principal problem Left femoral neck fracture secondary to fall - orthopedic surgery consulted, appreciate input.  He is status post left total hip replacement by Dr. Charlann Boxer on 12/10.  Recovering well postoperatively, has been placed on aspirin twice daily per orthopedic surgery.  PT evaluation is pending.  Active problems Acute kidney injury -creatinine 1.5 on admission, stable this morning, 1.45.  Suspect CKD 3 AA  BPH-continue Flomax  Hyperlipidemia-continue statin  DM2, with hyperglycemia-has been placed on sliding scale, continue.  A1c is 7.3 which is acceptable at his age group.  Persistently elevated CBGs in the setting of Decadron use, add glargine for today, may not need it tomorrow.  Lab Results  Component Value Date   HGBA1C 7.3 (H) 09/06/2021   CBG (last 3)  Recent Labs    01/21/22 1530 01/21/22 2015 01/22/22 0900  GLUCAP 161* 326* 304*     Scheduled Meds:  aspirin  81 mg Oral BID   atorvastatin  20 mg Oral QPM   docusate sodium  100 mg Oral BID   finasteride  5 mg Oral Daily   insulin  aspart  0-6 Units Subcutaneous TID WC   polyethylene glycol  17 g Oral BID   Continuous Infusions:  sodium chloride 75 mL/hr at 01/22/22 1030   methocarbamol (ROBAXIN) IV     PRN Meds:.acetaminophen, bisacodyl, diphenhydrAMINE, HYDROcodone-acetaminophen, HYDROcodone-acetaminophen, menthol-cetylpyridinium **OR** phenol, methocarbamol **OR** methocarbamol (ROBAXIN) IV, metoCLOPramide **OR** metoCLOPramide (REGLAN) injection, morphine injection, ondansetron **OR** ondansetron (ZOFRAN) IV  Current Outpatient Medications  Medication Instructions   aspirin EC 81 mg, Oral, Daily, For heart   atorvastatin (LIPITOR) 10 mg, Oral, Nightly, For cholesterol   dorzolamide-timolol (COSOPT) 22.3-6.8 MG/ML ophthalmic solution 1 drop, Both Eyes, 2 times daily, For glaucoma   finasteride (PROSCAR) 5 mg, Oral, Daily, For enlarged prostate   metFORMIN (GLUCOPHAGE) 500 mg, Oral, 2 times daily with meals   Multiple Vitamins-Minerals (ONE-A-DAY MENS 50+ PO) 1 tablet, Oral, Every morning   Saw Palmetto, Serenoa repens, (SAW PALMETTO PO) 2 capsules, Oral, Every morning    Diet Orders (From admission, onward)     Start     Ordered   01/21/22 1655  Diet regular Room service appropriate? Yes; Fluid consistency: Thin  Diet effective now       Question Answer Comment  Room service appropriate? Yes   Fluid consistency: Thin      01/21/22 1654            DVT prophylaxis: SCDs Start: 01/21/22 1655 Place TED hose Start: 01/21/22 1655   Lab Results  Component Value Date   PLT 165 01/22/2022  Code Status: Full Code  Family Communication: Daughter present at bedside  Status is: Inpatient  Remains inpatient appropriate because: Severity of the illness, PT eval pending   Level of care: Telemetry Surgical  Consultants:  Orthopedic surgery  Objective: Vitals:   01/21/22 2000 01/21/22 2013 01/21/22 2343 01/22/22 0400  BP:  (!) 145/64 (!) 112/57 (!) 122/57  Pulse: 87 85 74 63  Resp: (!) 23 18  13 17   Temp:  97.8 F (36.6 C) 98 F (36.7 C) 97.8 F (36.6 C)  TempSrc:  Oral Oral   SpO2: 98% 100% 98% 99%  Weight:      Height:        Intake/Output Summary (Last 24 hours) at 01/22/2022 1112 Last data filed at 01/22/2022 0900 Gross per 24 hour  Intake 220 ml  Output 550 ml  Net -330 ml    Wt Readings from Last 3 Encounters:  01/21/22 68.9 kg  11/10/21 68.9 kg  09/06/21 70.3 kg    Examination:  Constitutional: NAD Eyes: lids and conjunctivae normal, no scleral icterus ENMT: mmm Neck: normal, supple Respiratory: clear to auscultation bilaterally, no wheezing, no crackles. Normal respiratory effort.  Cardiovascular: Regular rate and rhythm, no murmurs / rubs / gallops. No LE edema. Abdomen: soft, no distention, no tenderness. Bowel sounds positive.  Skin: no rashes Neurologic: no focal deficits, equal strength   Data Reviewed: I have independently reviewed following labs and imaging studies   CBC Recent Labs  Lab 01/20/22 1135 01/21/22 0338 01/22/22 0435  WBC 9.0 8.2 10.1  HGB 11.8* 11.1* 9.7*  HCT 35.0* 33.5* 28.8*  PLT 220 176 165  MCV 92.3 93.3 92.3  MCH 31.1 30.9 31.1  MCHC 33.7 33.1 33.7  RDW 13.0 13.0 12.7  LYMPHSABS 0.9  --   --   MONOABS 0.7  --   --   EOSABS 0.2  --   --   BASOSABS 0.0  --   --      Recent Labs  Lab 01/20/22 1135 01/21/22 0338 01/22/22 0435  NA 135 134* 132*  K 4.6 4.3 4.5  CL 102 99 99  CO2 25 27 23   GLUCOSE 281* 210* 362*  BUN 29* 20 22  CREATININE 1.51* 1.31* 1.45*  CALCIUM 9.1 8.7* 8.1*  AST  --   --  17  ALT  --   --  13  ALKPHOS  --   --  48  BILITOT  --   --  0.4  ALBUMIN  --   --  2.6*  MG  --   --  1.7     ------------------------------------------------------------------------------------------------------------------ No results for input(s): "CHOL", "HDL", "LDLCALC", "TRIG", "CHOLHDL", "LDLDIRECT" in the last 72 hours.  Lab Results  Component Value Date   HGBA1C 7.3 (H) 09/06/2021    ------------------------------------------------------------------------------------------------------------------ No results for input(s): "TSH", "T4TOTAL", "T3FREE", "THYROIDAB" in the last 72 hours.  Invalid input(s): "FREET3"  Cardiac Enzymes No results for input(s): "CKMB", "TROPONINI", "MYOGLOBIN" in the last 168 hours.  Invalid input(s): "CK" ------------------------------------------------------------------------------------------------------------------ No results found for: "BNP"  CBG: Recent Labs  Lab 01/21/22 0816 01/21/22 1304 01/21/22 1530 01/21/22 2015 01/22/22 0900  GLUCAP 175* 156* 161* 326* 304*     Recent Results (from the past 240 hour(s))  Surgical pcr screen     Status: None   Collection Time: 01/21/22  5:58 AM   Specimen: Nasal Mucosa; Nasal Swab  Result Value Ref Range Status   MRSA, PCR NEGATIVE NEGATIVE Final   Staphylococcus aureus NEGATIVE  NEGATIVE Final    Comment: (NOTE) The Xpert SA Assay (FDA approved for NASAL specimens in patients 63 years of age and older), is one component of a comprehensive surveillance program. It is not intended to diagnose infection nor to guide or monitor treatment. Performed at [facility], [address] [place], [place] [address]      Radiology Studies: DG Pelvis Portable  Result Date: 01/21/2022 CLINICAL DATA:  Status post left hip arthroplasty. EXAM: PORTABLE PELVIS 1-2 VIEWS COMPARISON:  01/21/2022 FINDINGS: Postoperative changes from left total hip arthroplasty identified. Hardware components are in anatomic alignment. No signs of periprosthetic fracture. Gas is noted within the soft tissues overlying the proximal left femur. Mild degenerative changes noted within the right hip. Degenerative disc disease within the imaged portions of the lower lumbar spine. IMPRESSION: Status post left total hip arthroplasty. Electronically Signed   By: Signa Kell M.D.   On: 01/21/2022 16:14   DG HIP UNILAT  WITH PELVIS 1V LEFT  Result Date: 01/21/2022 CLINICAL DATA:  Fluoro guidance provided EXAM: DG HIP (WITH OR WITHOUT PELVIS) 1V*L* FINDINGS: Dose: 1.7 mGy Fluoro time 18s IMPRESSION: C-arm fluoro guidance provided. Electronically Signed   By: Layla Maw M.D.   On: 01/21/2022 15:12   DG C-Arm 1-60 Min-No Report  Result Date: 01/21/2022 Fluoroscopy was utilized by the requesting physician.  No radiographic interpretation.     Pamella Pert, MD, PhD [facility]  Between 7 am - 7 pm I am available, please contact me via Amion (for emergencies) or Securechat (non urgent messages)  Between 7 pm - 7 am I am not available, please contact night coverage MD/APP via Amion

## 2022-01-22 NOTE — Progress Notes (Signed)
   Subjective: 1 Day Post-Op Procedure(s) (LRB): TOTAL HIP ARTHROPLASTY ANTERIOR APPROACH (Left) Patient reports pain as mild.   Patient seen in rounds for Dr. Charlann Boxer. Patient is resting in bed with his daughter at the bedside. No acute events overnight. Voiding without difficulty.  We will start therapy today.   Objective: Vital signs in last 24 hours: Temp:  [97.2 F (36.2 C)-98.5 F (36.9 C)] 97.8 F (36.6 C) (12/11 0400) Pulse Rate:  [63-110] 63 (12/11 0400) Resp:  [13-23] 17 (12/11 0400) BP: (98-153)/(57-65) 122/57 (12/11 0400) SpO2:  [95 %-100 %] 99 % (12/11 0400) Weight:  [68.9 kg] 68.9 kg (12/10 1256)  Intake/Output from previous day:  Intake/Output Summary (Last 24 hours) at 01/22/2022 0800 Last data filed at 01/21/2022 2022 Gross per 24 hour  Intake 100 ml  Output 550 ml  Net -450 ml     Intake/Output this shift: No intake/output data recorded.  Labs: Recent Labs    01/20/22 1135 01/21/22 0338 01/22/22 0435  HGB 11.8* 11.1* 9.7*   Recent Labs    01/21/22 0338 01/22/22 0435  WBC 8.2 10.1  RBC 3.59* 3.12*  HCT 33.5* 28.8*  PLT 176 165   Recent Labs    01/21/22 0338 01/22/22 0435  NA 134* 132*  K 4.3 4.5  CL 99 99  CO2 27 23  BUN 20 22  CREATININE 1.31* 1.45*  GLUCOSE 210* 362*  CALCIUM 8.7* 8.1*   No results for input(s): "LABPT", "INR" in the last 72 hours.  Exam: General - Patient is Alert and Oriented Extremity - Neurologically intact Sensation intact distally Intact pulses distally Dorsiflexion/Plantar flexion intact Dressing - dressing C/D/I Motor Function - intact, moving foot and toes well on exam.   Past Medical History:  Diagnosis Date   Diabetes mellitus without complication (HCC)    Kidney disease     Assessment/Plan: 1 Day Post-Op Procedure(s) (LRB): TOTAL HIP ARTHROPLASTY ANTERIOR APPROACH (Left) Principal Problem:   Fracture of femoral neck, left, closed (HCC) Active Problems:   Uncontrolled type 2 diabetes  mellitus with hyperglycemia, without long-term current use of insulin (HCC)   Fall at home, initial encounter   AKI (acute kidney injury) (HCC)   BPH (benign prostatic hyperplasia)   Hyperlipidemia   S/P total left hip arthroplasty  Estimated body mass index is 21.19 kg/m as calculated from the following:   Height as of this encounter: 5' 10.98" (1.803 m).   Weight as of this encounter: 68.9 kg. Advance diet Up with therapy  DVT Prophylaxis - Aspirin Weight bearing as tolerated.  Hgb stable at 9.7 this AM.  Patient lives with his daughter and we both agree that preferred disposition will be back home with her.   No HHPT/OPPT typically required after hip replacement, but will see how he does with PT today and tomorrow.      Dennie Bible, PA-C Orthopedic Surgery [phone] 01/22/2022, 8:00 AM

## 2022-01-22 NOTE — Evaluation (Signed)
Occupational Therapy Evaluation Patient Details Name: Paul Colon MRN: [id number] DOB: 05-30-1929 Today's Date: 01/22/2022   History of Present Illness Pt is a 88 y/o male who presents 01/20/22 s/p fall at home, sustaining a L femoral neck fracture. He is now s/p L THA on 01/21/2022. PMH significant for DM, kidney disease.   Clinical Impression   Pt typically walks without a device and is assisted for showering and IADLs. He is otherwise independent. Pt lives wit his supportive daughter and son in [employer]. Pt presents with L hip pain, generalized weakness and impaired standing balane. He requires +2 min assist for OOB with close chair follow due to symptomatic hypotension (as low as 76/43 (55) after short distance ambulation, recovered to 113/54 (71) once returned to recliner. Pt needs set up to total assist for ADLs. Pt and daughter plan for pt to return home. They would like education in use of AE for LB ADLs.      Recommendations for follow up therapy are one component of a multi-disciplinary discharge planning process, led by the attending physician.  Recommendations may be updated based on patient status, additional functional criteria and insurance authorization.   Follow Up Recommendations  Home health OT     Assistance Recommended at Discharge Frequent or constant Supervision/Assistance  Patient can return home with the following A little help with walking and/or transfers;A lot of help with bathing/dressing/bathroom;Assistance with cooking/housework;Assist for transportation;Help with stairs or ramp for entrance    Functional Status Assessment  Patient has had a recent decline in their functional status and demonstrates the ability to make significant improvements in function in a reasonable and predictable amount of time.  Equipment Recommendations  BSC/3in1    Recommendations for Other Services       Precautions / Restrictions Precautions Precautions: Fall Precaution  Comments: watch BP Restrictions Weight Bearing Restrictions: Yes LLE Weight Bearing: Weight bearing as tolerated      Mobility Bed Mobility Overal bed mobility: Needs Assistance Bed Mobility: Supine to Sit     Supine to sit: Mod assist, +2 for physical assistance, HOB elevated     General bed mobility comments: Assist for LE advancement towards EOB. VC's for use of rails for support. Posterior support provided for trunk control as he transitioned to full sitting position at EOB.    Transfers Overall transfer level: Needs assistance Equipment used: Rolling walker (2 wheels) Transfers: Sit to/from Stand Sit to Stand: Min assist, +2 physical assistance, +2 safety/equipment, From elevated surface           General transfer comment: VC's for hand placement on seated surface for safety. +2 assist for power up to full stand and to gain/maintain standing balance.      Balance Overall balance assessment: Needs assistance Sitting-balance support: Single extremity supported, Feet supported Sitting balance-Leahy Scale: Fair     Standing balance support: Bilateral upper extremity supported, Reliant on assistive device for balance, During functional activity Standing balance-Leahy Scale: Poor                             ADL either performed or assessed with clinical judgement   ADL Overall ADL's : Needs assistance/impaired Eating/Feeding: Set up;Sitting   Grooming: Set up;Sitting   Upper Body Bathing: Minimal assistance;Sitting   Lower Body Bathing: Sit to/from stand;Total assistance   Upper Body Dressing : Minimal assistance;Sitting   Lower Body Dressing: Total assistance;Sit to/from stand   Toilet Transfer: Minimal assistance;+2 for  safety/equipment;Rolling walker (2 wheels);Ambulation           Functional mobility during ADLs: Minimal assistance;Rolling walker (2 wheels);+2 for safety/equipment General ADL Comments: Pt and family would like education in  use of AE.     Vision Baseline Vision/History: 1 Wears glasses Ability to See in Adequate Light: 0 Adequate Patient Visual Report: No change from baseline       Perception     Praxis      Pertinent Vitals/Pain Pain Assessment Pain Assessment: Faces Faces Pain Scale: Hurts even more Pain Location: L hip during transition to EOB. Pain Descriptors / Indicators: Grimacing, Guarding, Discomfort Pain Intervention(s): Premedicated before session, Monitored during session, Limited activity within patient's tolerance     Hand Dominance Right   Extremity/Trunk Assessment Upper Extremity Assessment Upper Extremity Assessment: Overall WFL for tasks assessed   Lower Extremity Assessment Lower Extremity Assessment: Defer to PT evaluation   Cervical / Trunk Assessment Cervical / Trunk Assessment: Kyphotic   Communication Communication Communication: HOH   Cognition Arousal/Alertness: Awake/alert Behavior During Therapy: WFL for tasks assessed/performed Overall Cognitive Status: Within Functional Limits for tasks assessed                                       General Comments       Exercises     Shoulder Instructions      Home Living Family/patient expects to be discharged to:: Private residence Living Arrangements: Children Available Help at Discharge: Family;Available 24 hours/day Type of Home: House Home Access: Stairs to enter [employer] of Steps: 3 Entrance Stairs-Rails: Can reach both Home Layout: Two level;Able to live on main level with bedroom/bathroom;Other (Comment) Alternate Level Stairs-Number of Steps: flight Alternate Level Stairs-Rails: Left Bathroom Shower/Tub: Producer, television/film/video: Standard     Home Equipment: Rollator (4 wheels);Cane - single point;Shower seat          Prior Functioning/Environment Prior Level of Function : Needs assist             Mobility Comments: Mostly without AD use,  however rollator utilized for longer distance mobility and SPC for nighttime bathroom trips (3-6 times a night). ADLs Comments: family helps him with showering and with IADLs        OT Problem List: Decreased strength;Impaired balance (sitting and/or standing);Decreased activity tolerance;Decreased safety awareness;Decreased knowledge of use of DME or AE;Pain      OT Treatment/Interventions: Self-care/ADL training;DME and/or AE instruction;Therapeutic activities;Patient/family education;Balance training    OT Goals(Current goals can be found in the care plan section) Acute Rehab OT Goals OT Goal Formulation: With patient Time For Goal Achievement: 02/05/22 Potential to Achieve Goals: Good ADL Goals Pt Will Perform Grooming: with min guard assist;standing Pt Will Perform Lower Body Bathing: with min assist;with adaptive equipment;with caregiver independent in assisting;sit to/from stand Pt Will Perform Lower Body Dressing: with min assist;with adaptive equipment;sit to/from stand;with caregiver independent in assisting Pt Will Transfer to Toilet: with min guard assist;ambulating;bedside commode Pt Will Perform Toileting - Clothing Manipulation and hygiene: with supervision;sit to/from stand Additional ADL Goal #1: Pt will perform bed mobility with min assist in preparation for ADLs.  OT Frequency: Min 2X/week    Co-evaluation PT/OT/SLP Co-Evaluation/Treatment: Yes Reason for Co-Treatment: For patient/therapist safety   OT goals addressed during session: ADL's and self-care      AM-PAC OT "6 Clicks" Daily Activity  Outcome Measure Help from another person eating meals?: None Help from another person taking care of personal grooming?: A Little Help from another person toileting, which includes using toliet, bedpan, or urinal?: A Lot Help from another person bathing (including washing, rinsing, drying)?: A Lot Help from another person to put on and taking off regular upper body  clothing?: A Little Help from another person to put on and taking off regular lower body clothing?: Total 6 Click Score: 15   End of Session Equipment Utilized During Treatment: Gait belt;Rolling walker (2 wheels) Nurse Communication: Other (comment) (aware pt in hypotensive)  Activity Tolerance: Treatment limited secondary to medical complications (Comment) (hypotensive) Patient left: in chair;with call bell/phone within reach;with chair alarm set;with family/visitor present  OT Visit Diagnosis: Unsteadiness on feet (R26.81);Other abnormalities of gait and mobility (R26.89);Pain;Muscle weakness (generalized) (M62.81)                Time: 8875-7972 OT Time Calculation (min): 37 min Charges:  OT General Charges $OT Visit: 1 Visit OT Evaluation $OT Eval Moderate Complexity: 1 Mod Berna Spare, OTR/L Acute Rehabilitation Services Office: [phone]  Evern Bio 01/22/2022, 1:32 PM

## 2022-01-22 NOTE — Inpatient Diabetes Management (Signed)
Inpatient Diabetes Program Recommendations  AACE/ADA: New Consensus Statement on Inpatient Glycemic Control   Target Ranges:  Prepandial:   less than 140 mg/dL      Peak postprandial:   less than 180 mg/dL (1-2 hours)      Critically ill patients:  140 - 180 mg/dL    Latest Reference Range & Units 01/21/22 08:16 01/21/22 13:04 01/21/22 15:30 01/21/22 20:15 01/22/22 09:00  Glucose-Capillary 70 - 99 mg/dL 211 (H) 941 (H) 740 (H) 326 (H) 304 (H)   Review of Glycemic Control  Diabetes history: DM2 Outpatient Diabetes medications: Metformin 500 mg BID Current orders for Inpatient glycemic control: Novolog 0-6 units TID with meals  Inpatient Diabetes Program Recommendations:    Insulin: No Novolog correction given at all on 01/21/22. Patient received Decadron 8 mg at 14:10 and Decadron 10 mg given today at 10:34 am. Novolog correction given this morning already. Will follow along.  Thanks, Orlando Penner, RN, MSN, CDCES Diabetes Coordinator Inpatient Diabetes Program [phone] (Team Pager from 8am to 5pm)

## 2022-01-23 LAB — COMPREHENSIVE METABOLIC PANEL
ALT: 9 U/L (ref 0–44)
AST: 15 U/L (ref 15–41)
Albumin: 2.7 g/dL — ABNORMAL LOW (ref 3.5–5.0)
Alkaline Phosphatase: 51 U/L (ref 38–126)
Anion gap: 8 (ref 5–15)
BUN: 33 mg/dL — ABNORMAL HIGH (ref 8–23)
CO2: 26 mmol/L (ref 22–32)
Calcium: 8.4 mg/dL — ABNORMAL LOW (ref 8.9–10.3)
Chloride: 98 mmol/L (ref 98–111)
Creatinine, Ser: 1.21 mg/dL (ref 0.61–1.24)
GFR, Estimated: 57 mL/min — ABNORMAL LOW (ref >60)
Glucose, Bld: 247 mg/dL — ABNORMAL HIGH (ref 70–99)
Potassium: 5 mmol/L (ref 3.5–5.1)
Sodium: 132 mmol/L — ABNORMAL LOW (ref 135–145)
Total Bilirubin: 0.3 mg/dL (ref 0.3–1.2)
Total Protein: 5.2 g/dL — ABNORMAL LOW (ref 6.5–8.1)

## 2022-01-23 LAB — MAGNESIUM: Magnesium: 2 mg/dL (ref 1.7–2.4)

## 2022-01-23 LAB — CBC
HCT: 25.1 % — ABNORMAL LOW (ref 39.0–52.0)
Hemoglobin: 8.8 g/dL — ABNORMAL LOW (ref 13.0–17.0)
MCH: 31.7 pg (ref 26.0–34.0)
MCHC: 35.1 g/dL (ref 30.0–36.0)
MCV: 90.3 fL (ref 80.0–100.0)
Platelets: 182 10*3/uL (ref 150–400)
RBC: 2.78 MIL/uL — ABNORMAL LOW (ref 4.22–5.81)
RDW: 12.6 % (ref 11.5–15.5)
WBC: 9.4 10*3/uL (ref 4.0–10.5)
nRBC: 0 % (ref 0.0–0.2)

## 2022-01-23 LAB — GLUCOSE, CAPILLARY
Glucose-Capillary: 215 mg/dL — ABNORMAL HIGH (ref 70–99)
Glucose-Capillary: 333 mg/dL — ABNORMAL HIGH (ref 70–99)
Glucose-Capillary: 288 mg/dL — ABNORMAL HIGH (ref 70–99)
Glucose-Capillary: 307 mg/dL — ABNORMAL HIGH (ref 70–99)

## 2022-01-23 MED ORDER — INSULIN ASPART 100 UNIT/ML IJ SOLN
4.0000 [IU] | Freq: Once | INTRAMUSCULAR | Status: AC
  Administered 2022-01-23: 4 [IU] via SUBCUTANEOUS

## 2022-01-23 MED ORDER — SODIUM CHLORIDE 0.9 % IV BOLUS
250.0000 mL | Freq: Once | INTRAVENOUS | Status: AC
  Administered 2022-01-23: 250 mL via INTRAVENOUS

## 2022-01-23 NOTE — Progress Notes (Signed)
Physical Therapy Treatment Patient Details Name: Paul Colon MRN: [id number] DOB: 17-Apr-1929 Today's Date: 01/23/2022   History of Present Illness Pt is a 88 y/o male who presents 01/20/22 s/p fall at home, sustaining a L femoral neck fracture. He is now s/p L THA on 01/21/2022. PMH significant for DM, kidney disease.    PT Comments    Pt progressing towards physical therapy goals and motivated to participate. Pt was able to demonstrate ~40' ambulation x2 bouts, with chair follow for safety. Daughter present and willing to assist. Pt was instructed in HEP and was able to complete with assist for full ROM. Anticipate we will initiate stair training tomorrow to prepare for d/c home.    Recommendations for follow up therapy are one component of a multi-disciplinary discharge planning process, led by the attending physician.  Recommendations may be updated based on patient status, additional functional criteria and insurance authorization.  Follow Up Recommendations  Home health PT     Assistance Recommended at Discharge Frequent or constant Supervision/Assistance  Patient can return home with the following A little help with walking and/or transfers;A little help with bathing/dressing/bathroom;Assistance with cooking/housework;Assist for transportation;Help with stairs or ramp for entrance   Equipment Recommendations  Rolling walker (2 wheels);BSC/3in1    Recommendations for Other Services       Precautions / Restrictions Precautions Precautions: Fall Precaution Comments: watch BP Restrictions Weight Bearing Restrictions: Yes LLE Weight Bearing: Weight bearing as tolerated     Mobility  Bed Mobility               General bed mobility comments: Pt was received sitting up in recliner    Transfers Overall transfer level: Needs assistance Equipment used: Rolling walker (2 wheels) Transfers: Sit to/from Stand Sit to Stand: Min assist           General transfer  comment: VC's for hand placement on seated surface for safety. heavy min assist for power up to full stand. Pt with good initiation, however difficulty transitioning hands from chair to RW.    Ambulation/Gait Ambulation/Gait assistance: Min assist, +2 safety/equipment Gait Distance (Feet): 40 Feet (x2) Assistive device: Rolling walker (2 wheels) Gait Pattern/deviations: Step-to pattern, Decreased stride length, Shuffle, Trunk flexed, Step-through pattern, Narrow base of support Gait velocity: Decreased Gait velocity interpretation: <1.31 ft/sec, indicative of household ambulator   General Gait Details: VC's for improved posture, increased heel strike and general safety with RW management. Pt was able to improve gait pattern from initial bout of ambulation to second bout.   Stairs             Wheelchair Mobility    Modified Rankin (Stroke Patients Only)       Balance Overall balance assessment: Needs assistance Sitting-balance support: Single extremity supported, Feet supported Sitting balance-Leahy Scale: Fair     Standing balance support: Bilateral upper extremity supported, Reliant on assistive device for balance, During functional activity Standing balance-Leahy Scale: Poor                              Cognition Arousal/Alertness: Awake/alert Behavior During Therapy: WFL for tasks assessed/performed Overall Cognitive Status: Within Functional Limits for tasks assessed                                          Exercises Total Joint Exercises Ankle Circles/Pumps:  10 reps Quad Sets: 10 reps Short Arc Quad: 10 reps, AROM Hip ABduction/ADduction: 10 reps, AAROM Long Arc Quad: 10 reps, AAROM    General Comments        Pertinent Vitals/Pain Pain Assessment Pain Assessment: Faces Faces Pain Scale: Hurts even more Pain Location: L hip during transition to EOB. Pain Descriptors / Indicators: Grimacing, Guarding, Discomfort Pain  Intervention(s): Limited activity within patient's tolerance, Monitored during session, Repositioned    Home Living Family/patient expects to be discharged to:: Private residence Living Arrangements: Children Available Help at Discharge: Family;Available 24 hours/day Type of Home: House Home Access: Stairs to enter Entrance Stairs-Rails: Can reach both Entrance Stairs-Number of Steps: 3 Alternate Level Stairs-Number of Steps: flight Home Layout: Two level;Able to live on main level with bedroom/bathroom;Other (Comment) Home Equipment: Rollator (4 wheels);Cane - single point;Shower seat Additional Comments: pt is up at night on [facility] for frequent trips to [facility]    Prior Function            PT Goals (current goals can now be found in the care plan section) Acute Rehab PT Goals Patient Stated Goal: Home with daughter at d/c PT Goal Formulation: With patient/family Time For Goal Achievement: 01/29/22 Potential to Achieve Goals: Good Progress towards PT goals: Progressing toward goals    Frequency    Min 5X/week      PT Plan Current plan remains appropriate    Co-evaluation              AM-PAC PT "6 Clicks" Mobility   Outcome Measure  Help needed turning from your back to your side while in a flat bed without using bedrails?: A Lot Help needed moving from lying on your back to sitting on the side of a flat bed without using bedrails?: A Lot Help needed moving to and from a bed to a chair (including a wheelchair)?: A Lot Help needed standing up from a chair using your arms (e.g., wheelchair or bedside chair)?: A Lot Help needed to walk in hospital room?: A Little Help needed climbing 3-5 steps with a railing? : Total 6 Click Score: 12    End of Session Equipment Utilized During Treatment: Gait belt Activity Tolerance: Patient tolerated treatment well Patient left: in chair;with call bell/phone within reach;with chair alarm set;with family/visitor present Nurse  Communication: Mobility status PT Visit Diagnosis: Unsteadiness on feet (R26.81);Pain Pain - Right/Left: Left Pain - part of body: Hip     Time: 1350-1455 PT Time Calculation (min) (ACUTE ONLY): 65 min  Charges:  $Gait Training: 23-37 mins $Therapeutic Exercise: 8-22 mins $Therapeutic Activity: 8-22 mins                     Conni Slipper, PT, DPT Acute Rehabilitation Services Secure Chat Preferred Office: [phone]    Paul Colon 01/23/2022, 3:10 PM

## 2022-01-23 NOTE — Progress Notes (Signed)
Mobility Specialist Progress Note   01/23/22 1230  Mobility  Activity Transferred to/from [facility];Transferred from bed to chair  Level of Assistance Moderate assist, patient does 50-74%  Assistive Device Front wheel walker  Distance Ambulated (ft) 4 ft  LLE Weight Bearing WBAT  Activity Response Tolerated well  $Mobility charge 1 Mobility   Pre Mobility: 84 HR, 133/53 BP, 98% SpO2 During Mobility: 89 HR, 120/67 BP, 95% SpO2 Post Mobility: 79 HR, 117/48 BP, 97% SpO2  Received pt in bed requesting assistance to the [facility], having no complaints in supine. Increased time needed d/t limited pain tolerance. ModA to get LLE to EOB and trunk upright + ModA to stand, pivot to [facility] w/ manual assistance of LLE. Pt requiring verbal cues for hand and foot placements + cues throughout d/t impulsiveness but no faults. After successful BM, pt able to take two steps forward to chair w/ manual assistance of LLE. Left in chair w/ no complaints, chair alarm on and call bell in reach.   Frederico Hamman Mobility Specialist Please contact via SecureChat or  Rehab office at [phone]

## 2022-01-23 NOTE — Progress Notes (Addendum)
PROGRESS NOTE  Paul Colon WUJ:[id number] DOB: [birth year] DOA: 01/20/2022 PCP: [facility]   LOS: 3 days   Brief Narrative / Interim history: 88 year old male with history of DM2, neuropathy, CKD 3 comes into the hospital after having a mechanical fall at home.  At baseline patient ambulates without assistance, sometimes uses a walker when he goes out.  He is occasionally unsteady on his feet due to neuropathy.  Subjective / 24h Interval events: Eating breakfast, doing well.  No chest pain, no shortness of breath.  Felt dizzy when working with PT yesterday  Assesement and Plan: Principal Problem:   Fracture of femoral neck, left, closed (HCC) Active Problems:   Fall at home, initial encounter   AKI (acute kidney injury) (HCC)   Uncontrolled type 2 diabetes mellitus with hyperglycemia, without long-term current use of insulin (HCC)   BPH (benign prostatic hyperplasia)   Hyperlipidemia   S/P total left hip arthroplasty  Principal problem Left femoral neck fracture secondary to fall - orthopedic surgery consulted, appreciate input.  He is status post left total hip replacement by Dr. Charlann Boxer on 12/10.  Recovering well postoperatively, has been placed on aspirin twice daily per orthopedic surgery.  PT evaluation yesterday complicated by postop hypotension, felt dizzy, had to stop early.  Received a fluid bolus, repeat PT evaluation today  Active problems Acute kidney injury -creatinine 1.5 on admission, improving, 1.2 this morning  Acute blood loss anemia-postoperatively, hemoglobin 11 on admission, 9.7 yesterday and 8.8 this morning.  No bleeding, no need for transfusions, monitor  BPH-hold home medications due to hypotension  Hyperlipidemia-continue statin  DM2, with hyperglycemia-has been placed on sliding scale, continue.  A1c is 7.3 which is acceptable at his age group.  Persistently elevated CBGs in the setting of Decadron use, glargine was briefly added, hold now since  not getting any more Decadron  Lab Results  Component Value Date   HGBA1C 7.3 (H) 09/06/2021   CBG (last 3)  Recent Labs    01/22/22 1925 01/22/22 2227 01/23/22 0739  GLUCAP 401* 420* 215*     Scheduled Meds:  aspirin  81 mg Oral BID   atorvastatin  20 mg Oral QPM   docusate sodium  100 mg Oral BID   insulin aspart  0-6 Units Subcutaneous TID WC   insulin glargine-yfgn  8 Units Subcutaneous Daily   polyethylene glycol  17 g Oral BID   Continuous Infusions:  methocarbamol (ROBAXIN) IV     PRN Meds:.acetaminophen, bisacodyl, diphenhydrAMINE, HYDROcodone-acetaminophen, HYDROcodone-acetaminophen, menthol-cetylpyridinium **OR** phenol, methocarbamol **OR** methocarbamol (ROBAXIN) IV, metoCLOPramide **OR** metoCLOPramide (REGLAN) injection, morphine injection, ondansetron **OR** ondansetron (ZOFRAN) IV, traMADol  Current Outpatient Medications  Medication Instructions   aspirin EC 81 mg, Oral, Daily, For heart   atorvastatin (LIPITOR) 10 mg, Oral, Nightly, For cholesterol   dorzolamide-timolol (COSOPT) 22.3-6.8 MG/ML ophthalmic solution 1 drop, Both Eyes, 2 times daily, For glaucoma   finasteride (PROSCAR) 5 mg, Oral, Daily, For enlarged prostate   metFORMIN (GLUCOPHAGE) 500 mg, Oral, 2 times daily with meals   Multiple Vitamins-Minerals (ONE-A-DAY MENS 50+ PO) 1 tablet, Oral, Every morning   Saw Palmetto, Serenoa repens, (SAW PALMETTO PO) 2 capsules, Oral, Every morning    Diet Orders (From admission, onward)     Start     Ordered   01/21/22 1655  Diet regular Room service appropriate? Yes; Fluid consistency: Thin  Diet effective now       Question Answer Comment  Room service appropriate? Yes   Fluid consistency: Thin  01/21/22 1654            DVT prophylaxis: SCDs Start: 01/21/22 1655 Place TED hose Start: 01/21/22 1655   Lab Results  Component Value Date   PLT 182 01/23/2022      Code Status: Full Code  Family Communication: Daughter present at  bedside  Status is: Inpatient  Remains inpatient appropriate because: Severity of the illness, PT eval pending   Level of care: Telemetry Surgical  Consultants:  Orthopedic surgery  Objective: Vitals:   01/22/22 1925 01/22/22 2124 01/23/22 0442 01/23/22 0744  BP: 132/60 105/64 99/60 (!) 140/97  Pulse: 68  (!) 56 65  Resp: 20  16 18   Temp: 98 F (36.7 C)  98.3 F (36.8 C) 98 F (36.7 C)  TempSrc: Oral  Oral Oral  SpO2: 98%  96% 99%  Weight:      Height:        Intake/Output Summary (Last 24 hours) at 01/23/2022 1025 Last data filed at 01/23/2022 0539 Gross per 24 hour  Intake 240 ml  Output 550 ml  Net -310 ml    Wt Readings from Last 3 Encounters:  01/21/22 68.9 kg  11/10/21 68.9 kg  09/06/21 70.3 kg    Examination:  Constitutional: NAD Eyes: lids and conjunctivae normal, no scleral icterus ENMT: mmm Neck: normal, supple Respiratory: clear to auscultation bilaterally, no wheezing, no crackles. Normal respiratory effort.  Cardiovascular: Regular rate and rhythm, no murmurs / rubs / gallops. No LE edema. Abdomen: soft, no distention, no tenderness. Bowel sounds positive.  Skin: no rashes Neurologic: no focal deficits, equal strength  Data Reviewed: I have independently reviewed following labs and imaging studies   CBC Recent Labs  Lab 01/20/22 1135 01/21/22 0338 01/22/22 0435 01/23/22 0303  WBC 9.0 8.2 10.1 9.4  HGB 11.8* 11.1* 9.7* 8.8*  HCT 35.0* 33.5* 28.8* 25.1*  PLT 220 176 165 182  MCV 92.3 93.3 92.3 90.3  MCH 31.1 30.9 31.1 31.7  MCHC 33.7 33.1 33.7 35.1  RDW 13.0 13.0 12.7 12.6  LYMPHSABS 0.9  --   --   --   MONOABS 0.7  --   --   --   EOSABS 0.2  --   --   --   BASOSABS 0.0  --   --   --      Recent Labs  Lab 01/20/22 1135 01/21/22 0338 01/22/22 0435 01/23/22 0303  NA 135 134* 132* 132*  K 4.6 4.3 4.5 5.0  CL 102 99 99 98  CO2 25 27 23 26   GLUCOSE 281* 210* 362* 247*  BUN 29* 20 22 33*  CREATININE 1.51* 1.31* 1.45* 1.21   CALCIUM 9.1 8.7* 8.1* 8.4*  AST  --   --  17 15  ALT  --   --  13 9  ALKPHOS  --   --  48 51  BILITOT  --   --  0.4 0.3  ALBUMIN  --   --  2.6* 2.7*  MG  --   --  1.7 2.0     ------------------------------------------------------------------------------------------------------------------ No results for input(s): "CHOL", "HDL", "LDLCALC", "TRIG", "CHOLHDL", "LDLDIRECT" in the last 72 hours.  Lab Results  Component Value Date   HGBA1C 7.3 (H) 09/06/2021   ------------------------------------------------------------------------------------------------------------------ No results for input(s): "TSH", "T4TOTAL", "T3FREE", "THYROIDAB" in the last 72 hours.  Invalid input(s): "FREET3"  Cardiac Enzymes No results for input(s): "CKMB", "TROPONINI", "MYOGLOBIN" in the last 168 hours.  Invalid input(s): "CK" ------------------------------------------------------------------------------------------------------------------ No results found for: "  BNP"  CBG: Recent Labs  Lab 01/22/22 1207 01/22/22 1622 01/22/22 1925 01/22/22 2227 01/23/22 0739  GLUCAP 327* 346* 401* 420* 215*     Recent Results (from the past 240 hour(s))  Surgical pcr screen     Status: None   Collection Time: 01/21/22  5:58 AM   Specimen: Nasal Mucosa; Nasal Swab  Result Value Ref Range Status   MRSA, PCR NEGATIVE NEGATIVE Final   Staphylococcus aureus NEGATIVE NEGATIVE Final    Comment: (NOTE) The Xpert SA Assay (FDA approved for NASAL specimens in patients 86 years of age and older), is one component of a comprehensive surveillance program. It is not intended to diagnose infection nor to guide or monitor treatment. Performed at [facility], [address] [place], [place] [address]      Radiology Studies: ECHOCARDIOGRAM COMPLETE  Result Date: 01/22/2022    ECHOCARDIOGRAM REPORT   Patient Name:   Nance PewBRYAN Gavilanes Date of Exam: 01/22/2022 Medical Rec #:  [id number]    Height:       71.0 in  Accession #:    [id number]   Weight:       151.9 lb Date of Birth:  12-28-1929    BSA:          1.876 m Patient Age:    89 years     BP:           122/57 mmHg Patient Gender: M            HR:           66 bpm. Exam Location:  Inpatient Procedure: 2D Echo, Cardiac Doppler and Color Doppler Indications:    Dyspnea R06.00  History:        Patient has no prior history of Echocardiogram examinations.                 Risk Factors:Diabetes and Dyslipidemia. Chronic kidney disease,                 stage 3a.  Sonographer:    Lucendia HerrlichShanika Turnbull Referring Phys: [id number] Daylene KatayamaOSTIN M Savino Whisenant IMPRESSIONS  1. Left ventricular ejection fraction, by estimation, is 60 to 65%. The left ventricle has normal function. The left ventricle has no regional wall motion abnormalities. Left ventricular diastolic parameters are indeterminate.  2. Right ventricular systolic function is normal. The right ventricular size is normal.  3. The mitral valve is normal in structure. Trivial mitral valve regurgitation. No evidence of mitral stenosis.  4. The aortic valve is tricuspid. There is mild calcification of the aortic valve. There is mild thickening of the aortic valve. Aortic valve regurgitation is trivial. Aortic valve sclerosis is present, with no evidence of aortic valve stenosis.  5. Aortic dilatation noted. There is borderline dilatation of the ascending aorta, measuring 39 mm. Comparison(s): No prior Echocardiogram. Conclusion(s)/Recommendation(s): Otherwise normal echocardiogram, with minor abnormalities described in the report. FINDINGS  Left Ventricle: Left ventricular ejection fraction, by estimation, is 60 to 65%. The left ventricle has normal function. The left ventricle has no regional wall motion abnormalities. The left ventricular internal cavity size was normal in size. There is  no left ventricular hypertrophy. Left ventricular diastolic parameters are indeterminate. Right Ventricle: The right ventricular size is normal. No increase in  right ventricular wall thickness. Right ventricular systolic function is normal. Left Atrium: Left atrial size was normal in size. Right Atrium: Right atrial size was normal in size. Pericardium: There is no evidence of pericardial effusion. Mitral Valve:  The mitral valve is normal in structure. Trivial mitral valve regurgitation. No evidence of mitral valve stenosis. Tricuspid Valve: The tricuspid valve is normal in structure. Tricuspid valve regurgitation is mild . No evidence of tricuspid stenosis. Aortic Valve: The aortic valve is tricuspid. There is mild calcification of the aortic valve. There is mild thickening of the aortic valve. Aortic valve regurgitation is trivial. Aortic valve sclerosis is present, with no evidence of aortic valve stenosis. Aortic valve mean gradient measures 4.5 mmHg. Aortic valve peak gradient measures 8.1 mmHg. Aortic valve area, by VTI measures 2.42 cm. Pulmonic Valve: The pulmonic valve was not well visualized. Pulmonic valve regurgitation is not visualized. No evidence of pulmonic stenosis. Aorta: Aortic dilatation noted. There is borderline dilatation of the ascending aorta, measuring 39 mm. Venous: The inferior vena cava was not well visualized. IAS/Shunts: No atrial level shunt detected by color flow Doppler.  LEFT VENTRICLE PLAX 2D LVIDd:         3.70 cm   Diastology LVIDs:         2.30 cm   LV e' medial:   5.22 cm/s LV PW:         1.10 cm   LV E/e' medial: 16.2 LV IVS:        1.30 cm LVOT diam:     2.10 cm LV SV:         73 LV SV Index:   39 LVOT Area:     3.46 cm  RIGHT VENTRICLE RV S prime:     10.30 cm/s TAPSE (M-mode): 1.8 cm LEFT ATRIUM           Index        RIGHT ATRIUM          Index LA diam:      3.80 cm 2.03 cm/m   RA Area:     8.93 cm LA Vol (A2C): 22.0 ml 11.73 ml/m  RA Volume:   15.47 ml 8.24 ml/m LA Vol (A4C): 42.5 ml 22.66 ml/m  AORTIC VALVE AV Area (Vmax):    2.52 cm AV Area (Vmean):   2.47 cm AV Area (VTI):     2.42 cm AV Vmax:           142.00 cm/s  AV Vmean:          95.900 cm/s AV VTI:            0.300 m AV Peak Grad:      8.1 mmHg AV Mean Grad:      4.5 mmHg LVOT Vmax:         103.50 cm/s LVOT Vmean:        68.500 cm/s LVOT VTI:          0.210 m LVOT/AV VTI ratio: 0.70  AORTA Ao Root diam: 4.00 cm Ao Asc diam:  3.90 cm MITRAL VALVE                TRICUSPID VALVE MV Area (PHT): 3.48 cm     TR Peak grad:   31.6 mmHg MV Decel Time: 218 msec     TR Vmax:        281.00 cm/s MV E velocity: 84.40 cm/s MV A velocity: 111.00 cm/s  SHUNTS MV E/A ratio:  0.76         Systemic VTI:  0.21 m  Systemic Diam: 2.10 cm Jodelle Red MD Electronically signed by Jodelle Red MD Signature Date/Time: 01/22/2022/4:37:47 PM    Final      Pamella Pert, MD, PhD [facility]  Between 7 am - 7 pm I am available, please contact me via Amion (for emergencies) or Securechat (non urgent messages)  Between 7 pm - 7 am I am not available, please contact night coverage MD/APP via Amion

## 2022-01-23 NOTE — Plan of Care (Signed)
  Problem: Education: Goal: Ability to describe self-care measures that may prevent or decrease complications (Diabetes Survival Skills Education) will improve Outcome: Progressing Goal: Individualized Educational Video(s) Outcome: Progressing   Problem: Coping: Goal: Ability to adjust to condition or change in health will improve Outcome: Progressing   Problem: Fluid Volume: Goal: Ability to maintain a balanced intake and output will improve Outcome: Progressing   Problem: Health Behavior/Discharge Planning: Goal: Ability to identify and utilize available resources and services will improve Outcome: Progressing Goal: Ability to manage health-related needs will improve Outcome: Progressing   Problem: Metabolic: Goal: Ability to maintain appropriate glucose levels will improve Outcome: Progressing   Problem: Nutritional: Goal: Maintenance of adequate nutrition will improve Outcome: Progressing Goal: Progress toward achieving an optimal weight will improve Outcome: Progressing   Problem: Skin Integrity: Goal: Risk for impaired skin integrity will decrease Outcome: Progressing   Problem: Tissue Perfusion: Goal: Adequacy of tissue perfusion will improve Outcome: Progressing   Problem: Education: Goal: Knowledge of General Education information will improve Description: Including pain rating scale, medication(s)/side effects and non-pharmacologic comfort measures Outcome: Progressing   Problem: Health Behavior/Discharge Planning: Goal: Ability to manage health-related needs will improve Outcome: Progressing   Problem: Clinical Measurements: Goal: Ability to maintain clinical measurements within normal limits will improve Outcome: Progressing Goal: Will remain free from infection Outcome: Progressing Goal: Diagnostic test results will improve Outcome: Progressing Goal: Respiratory complications will improve Outcome: Progressing Goal: Cardiovascular complication will  be avoided Outcome: Progressing   Problem: Activity: Goal: Risk for activity intolerance will decrease Outcome: Progressing   Problem: Nutrition: Goal: Adequate nutrition will be maintained Outcome: Progressing   Problem: Coping: Goal: Level of anxiety will decrease Outcome: Progressing   Problem: Elimination: Goal: Will not experience complications related to bowel motility Outcome: Progressing Goal: Will not experience complications related to urinary retention Outcome: Progressing   Problem: Pain Managment: Goal: General experience of comfort will improve Outcome: Progressing   Problem: Safety: Goal: Ability to remain free from injury will improve Outcome: Progressing   Problem: Skin Integrity: Goal: Risk for impaired skin integrity will decrease Outcome: Progressing   Problem: Education: Goal: Knowledge of the prescribed therapeutic regimen will improve Outcome: Progressing Goal: Understanding of discharge needs will improve Outcome: Progressing Goal: Individualized Educational Video(s) Outcome: Progressing   Problem: Activity: Goal: Ability to avoid complications of mobility impairment will improve Outcome: Progressing Goal: Ability to tolerate increased activity will improve Outcome: Progressing   Problem: Clinical Measurements: Goal: Postoperative complications will be avoided or minimized Outcome: Progressing   Problem: Pain Management: Goal: Pain level will decrease with appropriate interventions Outcome: Progressing   Problem: Skin Integrity: Goal: Will show signs of wound healing Outcome: Progressing   

## 2022-01-23 NOTE — Plan of Care (Signed)
  Problem: Health Behavior/Discharge Planning: Goal: Ability to manage health-related needs will improve Outcome: Progressing   Problem: Nutritional: Goal: Maintenance of adequate nutrition will improve Outcome: Progressing   Problem: Skin Integrity: Goal: Risk for impaired skin integrity will decrease Outcome: Progressing   Problem: Coping: Goal: Level of anxiety will decrease Outcome: Progressing

## 2022-01-23 NOTE — Progress Notes (Signed)
ABLA: Hgb stable at 8.8.  Patient ambulated minimally with PT yesterday secondary to orthostatic hypotension. I put in fluid bolus this morning. Discussed with family previously importance of good hydration post-operatively.  He was changed off of flomax fairly recently because it had contributed to dizziness, would  consider holding proscar temporarily if continues.  Continue working with PT today with goal of eventual discharge home.    Rosalene Billings, PA-C Orthopedic Surgery [facility] [place] [phone]

## 2022-01-24 ENCOUNTER — Encounter (HOSPITAL_COMMUNITY): Payer: Self-pay | Admitting: Orthopedic Surgery

## 2022-01-24 LAB — BASIC METABOLIC PANEL
Anion gap: 8 (ref 5–15)
Anion gap: 10 (ref 5–15)
BUN: 43 mg/dL — ABNORMAL HIGH (ref 8–23)
BUN: 41 mg/dL — ABNORMAL HIGH (ref 8–23)
CO2: 25 mmol/L (ref 22–32)
CO2: 26 mmol/L (ref 22–32)
Calcium: 8.3 mg/dL — ABNORMAL LOW (ref 8.9–10.3)
Calcium: 8.6 mg/dL — ABNORMAL LOW (ref 8.9–10.3)
Chloride: 99 mmol/L (ref 98–111)
Chloride: 96 mmol/L — ABNORMAL LOW (ref 98–111)
Creatinine, Ser: 1.69 mg/dL — ABNORMAL HIGH (ref 0.61–1.24)
Creatinine, Ser: 1.53 mg/dL — ABNORMAL HIGH (ref 0.61–1.24)
GFR, Estimated: 38 mL/min — ABNORMAL LOW (ref >60)
GFR, Estimated: 43 mL/min — ABNORMAL LOW (ref >60)
Glucose, Bld: 268 mg/dL — ABNORMAL HIGH (ref 70–99)
Glucose, Bld: 276 mg/dL — ABNORMAL HIGH (ref 70–99)
Potassium: 4.8 mmol/L (ref 3.5–5.1)
Potassium: 4.2 mmol/L (ref 3.5–5.1)
Sodium: 132 mmol/L — ABNORMAL LOW (ref 135–145)
Sodium: 132 mmol/L — ABNORMAL LOW (ref 135–145)

## 2022-01-24 LAB — CBC
HCT: 24.2 % — ABNORMAL LOW (ref 39.0–52.0)
Hemoglobin: 8.4 g/dL — ABNORMAL LOW (ref 13.0–17.0)
MCH: 31.7 pg (ref 26.0–34.0)
MCHC: 34.7 g/dL (ref 30.0–36.0)
MCV: 91.3 fL (ref 80.0–100.0)
Platelets: 197 10*3/uL (ref 150–400)
RBC: 2.65 MIL/uL — ABNORMAL LOW (ref 4.22–5.81)
RDW: 13 % (ref 11.5–15.5)
WBC: 8.6 10*3/uL (ref 4.0–10.5)
nRBC: 0 % (ref 0.0–0.2)

## 2022-01-24 LAB — GLUCOSE, CAPILLARY
Glucose-Capillary: 268 mg/dL — ABNORMAL HIGH (ref 70–99)
Glucose-Capillary: 227 mg/dL — ABNORMAL HIGH (ref 70–99)
Glucose-Capillary: 266 mg/dL — ABNORMAL HIGH (ref 70–99)
Glucose-Capillary: 242 mg/dL — ABNORMAL HIGH (ref 70–99)

## 2022-01-24 LAB — MAGNESIUM: Magnesium: 1.9 mg/dL (ref 1.7–2.4)

## 2022-01-24 MED ORDER — INSULIN ASPART 100 UNIT/ML IJ SOLN
0.0000 [IU] | Freq: Every day | INTRAMUSCULAR | Status: DC
  Administered 2022-01-24: 2 [IU] via SUBCUTANEOUS

## 2022-01-24 MED ORDER — INSULIN ASPART 100 UNIT/ML IJ SOLN
0.0000 [IU] | Freq: Three times a day (TID) | INTRAMUSCULAR | Status: DC
  Administered 2022-01-24: 5 [IU] via SUBCUTANEOUS
  Administered 2022-01-24 – 2022-01-25 (×2): 8 [IU] via SUBCUTANEOUS

## 2022-01-24 MED ORDER — FINASTERIDE 5 MG PO TABS
5.0000 mg | ORAL_TABLET | Freq: Every day | ORAL | Status: DC
  Administered 2022-01-24 – 2022-01-25 (×2): 5 mg via ORAL
  Filled 2022-01-24 (×2): qty 1

## 2022-01-24 MED ORDER — ACETAMINOPHEN 500 MG PO TABS
1000.0000 mg | ORAL_TABLET | Freq: Three times a day (TID) | ORAL | Status: DC
  Administered 2022-01-24 – 2022-01-25 (×4): 1000 mg via ORAL
  Filled 2022-01-24 (×4): qty 2

## 2022-01-24 NOTE — Progress Notes (Signed)
Physical Therapy Treatment Patient Details Name: Paul Colon MRN: [id number] DOB: 25-Jun-1929 Today's Date: 01/24/2022   History of Present Illness Pt is a 88 y/o male who presents 01/20/22 s/p fall at home, sustaining a L femoral neck fracture. He is now s/p L THA on 01/21/2022. PMH significant for DM, kidney disease.    PT Comments    Pt progressing towards physical therapy goals. Was able to perform transfers and ambulation with up to mod assist and RW for support. Daughter present throughout session, taking notes and pictures/video during HEP instruction. Handout provided for HEP however continue to recommend HHPT to progress mobility and maximize safety. Reviewed education for car transfer with pt and daughter and pt completed stair training this session. Will continue to follow and progress as able per POC.   Recommendations for follow up therapy are one component of a multi-disciplinary discharge planning process, led by the attending physician.  Recommendations may be updated based on patient status, additional functional criteria and insurance authorization.  Follow Up Recommendations  Home health PT     Assistance Recommended at Discharge Frequent or constant Supervision/Assistance  Patient can return home with the following A little help with walking and/or transfers;A little help with bathing/dressing/bathroom;Assistance with cooking/housework;Assist for transportation;Help with stairs or ramp for entrance   Equipment Recommendations  Rolling walker (2 wheels);BSC/3in1    Recommendations for Other Services       Precautions / Restrictions Precautions Precautions: Fall Precaution Comments: watch BP Restrictions Weight Bearing Restrictions: Yes LLE Weight Bearing: Weight bearing as tolerated     Mobility  Bed Mobility Overal bed mobility: Needs Assistance Bed Mobility: Supine to Sit     Supine to sit: Mod assist     General bed mobility comments: Assist to  advance L LE towards EOB and to raise trunk from flat bed.    Transfers Overall transfer level: Needs assistance Equipment used: Rolling walker (2 wheels) Transfers: Sit to/from Stand Sit to Stand: Min guard           General transfer comment: VC's for hand placement on seated surface for safety.    Ambulation/Gait Ambulation/Gait assistance: Min assist, +2 safety/equipment Gait Distance (Feet): 20 Feet Assistive device: Rolling walker (2 wheels) Gait Pattern/deviations: Step-to pattern, Decreased stride length, Shuffle, Trunk flexed, Step-through pattern, Narrow base of support Gait velocity: Decreased Gait velocity interpretation: <1.31 ft/sec, indicative of household ambulator   General Gait Details: VC's for improved posture, increased heel strike and general safety with RW management.   Stairs Stairs: Yes Stairs assistance: Min guard, Min assist Stair Management: Two rails, Step to pattern, Forwards Number of Stairs: 5 General stair comments: Practice stairs in rehab gym. Pt was able to negotiate 5 stairs progressing to min guard assist throughout.   Wheelchair Mobility    Modified Rankin (Stroke Patients Only)       Balance Overall balance assessment: Needs assistance Sitting-balance support: Single extremity supported, Feet supported Sitting balance-Leahy Scale: Fair     Standing balance support: Bilateral upper extremity supported Standing balance-Leahy Scale: Poor Standing balance comment: can release walker in static standing                            Cognition Arousal/Alertness: Awake/alert Behavior During Therapy: WFL for tasks assessed/performed Overall Cognitive Status: Within Functional Limits for tasks assessed  Exercises Total Joint Exercises Ankle Circles/Pumps: 10 reps Quad Sets: 10 reps Short Arc Quad: 10 reps, AROM Heel Slides: 10 reps, AAROM Hip  ABduction/ADduction: 10 reps, AAROM    General Comments        Pertinent Vitals/Pain Pain Assessment Pain Assessment: Faces Faces Pain Scale: Hurts even more Pain Location: L hip during transition to EOB. Pain Descriptors / Indicators: Grimacing, Guarding, Discomfort Pain Intervention(s): Limited activity within patient's tolerance, Monitored during session, Repositioned    Home Living                          Prior Function            PT Goals (current goals can now be found in the care plan section) Acute Rehab PT Goals Patient Stated Goal: Home with daughter at d/c PT Goal Formulation: With patient/family Time For Goal Achievement: 01/29/22 Potential to Achieve Goals: Good Progress towards PT goals: Progressing toward goals    Frequency    Min 5X/week      PT Plan Current plan remains appropriate    Co-evaluation              AM-PAC PT "6 Clicks" Mobility   Outcome Measure  Help needed turning from your back to your side while in a flat bed without using bedrails?: A Lot Help needed moving from lying on your back to sitting on the side of a flat bed without using bedrails?: A Lot Help needed moving to and from a bed to a chair (including a wheelchair)?: A Lot Help needed standing up from a chair using your arms (e.g., wheelchair or bedside chair)?: A Lot Help needed to walk in hospital room?: A Little Help needed climbing 3-5 steps with a railing? : Total 6 Click Score: 12    End of Session Equipment Utilized During Treatment: Gait belt Activity Tolerance: Patient tolerated treatment well Patient left: in chair;with call bell/phone within reach;with chair alarm set;with family/visitor present Nurse Communication: Mobility status PT Visit Diagnosis: Unsteadiness on feet (R26.81);Pain Pain - Right/Left: Left Pain - part of body: Hip     Time: 1110-1202 PT Time Calculation (min) (ACUTE ONLY): 52 min  Charges:  $Gait Training: 23-37  mins $Therapeutic Exercise: 8-22 mins                     Conni Slipper, PT, DPT Acute Rehabilitation Services Secure Chat Preferred Office: [phone]    Marylynn Pearson 01/24/2022, 2:14 PM

## 2022-01-24 NOTE — Progress Notes (Signed)
PROGRESS NOTE  Paul Colon [facility]:[id number] DOB: Jun 27, 1929 DOA: 01/20/2022 PCP: [facility]   LOS: 4 days   Brief Narrative / Interim history: 88 year old male with history of DM2, neuropathy, CKD 3 comes into the hospital after having a mechanical fall at home.  At baseline patient ambulates without assistance, sometimes uses a walker when he goes out.  He is occasionally unsteady on his feet due to neuropathy. Plan is home with daughter in the AM if continues to progress.   Subjective / 24h Interval events: On bedside commode  Assesement and Plan: Principal Problem:   Fracture of femoral neck, left, closed (HCC) Active Problems:   Fall at home, initial encounter   AKI (acute kidney injury) (HCC)   Uncontrolled type 2 diabetes mellitus with hyperglycemia, without long-term current use of insulin (HCC)   BPH (benign prostatic hyperplasia)   Hyperlipidemia   S/P total left hip arthroplasty   Left femoral neck fracture secondary to fall  - orthopedic surgery consulted, appreciate input.  He is status post left total hip replacement by Dr. Charlann Boxer on 12/10.  Recovering well postoperatively, has been placed on aspirin twice daily per orthopedic surgery.   -PT eval  Acute kidney injury vs CKD -CR rose overnight to 1.6 which is close to what his CR was when he came in  Acute blood loss anemia-postoperatively -trend  BPH-hold home medications due to hypotension -check PVR and bladder scan  Hyperlipidemia -continue statin  DM2, with hyperglycemia-has been placed on sliding scale, continue.  A1c is 7.3 which is acceptable at his age group.      Scheduled Meds:  acetaminophen  1,000 mg Oral TID   aspirin  81 mg Oral BID   atorvastatin  20 mg Oral QPM   docusate sodium  100 mg Oral BID   insulin aspart  0-6 Units Subcutaneous TID WC   polyethylene glycol  17 g Oral BID   Continuous Infusions:  methocarbamol (ROBAXIN) IV     PRN Meds:.bisacodyl, diphenhydrAMINE,  menthol-cetylpyridinium **OR** phenol, methocarbamol **OR** methocarbamol (ROBAXIN) IV, metoCLOPramide **OR** metoCLOPramide (REGLAN) injection, ondansetron **OR** ondansetron (ZOFRAN) IV, traMADol  Current Outpatient Medications  Medication Instructions   aspirin EC 81 mg, Oral, Daily, For heart   atorvastatin (LIPITOR) 10 mg, Oral, Nightly, For cholesterol   dorzolamide-timolol (COSOPT) 22.3-6.8 MG/ML ophthalmic solution 1 drop, Both Eyes, 2 times daily, For glaucoma   finasteride (PROSCAR) 5 mg, Oral, Daily, For enlarged prostate   metFORMIN (GLUCOPHAGE) 500 mg, Oral, 2 times daily with meals   Multiple Vitamins-Minerals (ONE-A-DAY MENS 50+ PO) 1 tablet, Oral, Every morning   Saw Palmetto, Serenoa repens, (SAW PALMETTO PO) 2 capsules, Oral, Every morning    Diet Orders (From admission, onward)     Start     Ordered   01/21/22 1655  Diet regular Room service appropriate? Yes; Fluid consistency: Thin  Diet effective now       Question Answer Comment  Room service appropriate? Yes   Fluid consistency: Thin      01/21/22 1654            DVT prophylaxis: SCDs Start: 01/21/22 1655 Place TED hose Start: 01/21/22 1655   Lab Results  Component Value Date   PLT 197 01/24/2022      Code Status: Full Code  Family Communication: Daughter present at bedside  Status is: Inpatient  Remains inpatient appropriate because: home in AM?   Level of care: Med-Surg  Consultants:  Orthopedic surgery  Objective: Vitals:  01/23/22 1707 01/23/22 2057 01/24/22 0350 01/24/22 0803  BP: 127/67 (!) 116/58 (!) 128/56 119/67  Pulse: 63 78 69 69  Resp: 19 19    Temp:  98.3 F (36.8 C) 98 F (36.7 C) 98.9 F (37.2 C)  TempSrc:  Oral Oral Oral  SpO2: 99% 94% 96% 95%  Weight:      Height:        Intake/Output Summary (Last 24 hours) at 01/24/2022 0905 Last data filed at 01/24/2022 0844 Gross per 24 hour  Intake 240 ml  Output 900 ml  Net -660 ml   Wt Readings from Last 3  Encounters:  01/21/22 68.9 kg  11/10/21 68.9 kg  09/06/21 70.3 kg    Examination:   General: Appearance:    Elderly  male in no acute distress     Lungs:     respirations unlabored     MS:   All extremities are intact.   Neurologic:   Awake, alert,hard of hearing    Data Reviewed: I have independently reviewed following labs and imaging studies   CBC Recent Labs  Lab 01/20/22 1135 01/21/22 0338 01/22/22 0435 01/23/22 0303 01/24/22 0348  WBC 9.0 8.2 10.1 9.4 8.6  HGB 11.8* 11.1* 9.7* 8.8* 8.4*  HCT 35.0* 33.5* 28.8* 25.1* 24.2*  PLT 220 176 165 182 197  MCV 92.3 93.3 92.3 90.3 91.3  MCH 31.1 30.9 31.1 31.7 31.7  MCHC 33.7 33.1 33.7 35.1 34.7  RDW 13.0 13.0 12.7 12.6 13.0  LYMPHSABS 0.9  --   --   --   --   MONOABS 0.7  --   --   --   --   EOSABS 0.2  --   --   --   --   BASOSABS 0.0  --   --   --   --     Recent Labs  Lab 01/20/22 1135 01/21/22 0338 01/22/22 0435 01/23/22 0303 01/24/22 0348  NA 135 134* 132* 132* 132*  K 4.6 4.3 4.5 5.0 4.8  CL 102 99 99 98 99  CO2 25 27 23 26 25   GLUCOSE 281* 210* 362* 247* 268*  BUN 29* 20 22 33* 43*  CREATININE 1.51* 1.31* 1.45* 1.21 1.69*  CALCIUM 9.1 8.7* 8.1* 8.4* 8.3*  AST  --   --  17 15  --   ALT  --   --  13 9  --   ALKPHOS  --   --  48 51  --   BILITOT  --   --  0.4 0.3  --   ALBUMIN  --   --  2.6* 2.7*  --   MG  --   --  1.7 2.0 1.9    ------------------------------------------------------------------------------------------------------------------ No results for input(s): "CHOL", "HDL", "LDLCALC", "TRIG", "CHOLHDL", "LDLDIRECT" in the last 72 hours.  Lab Results  Component Value Date   HGBA1C 7.3 (H) 09/06/2021   ------------------------------------------------------------------------------------------------------------------ No results for input(s): "TSH", "T4TOTAL", "T3FREE", "THYROIDAB" in the last 72 hours.  Invalid input(s): "FREET3"  Cardiac Enzymes No results for input(s): "CKMB",  "TROPONINI", "MYOGLOBIN" in the last 168 hours.  Invalid input(s): "CK" ------------------------------------------------------------------------------------------------------------------ No results found for: "BNP"  CBG: Recent Labs  Lab 01/23/22 0739 01/23/22 1134 01/23/22 1601 01/23/22 1941 01/24/22 0804  GLUCAP 215* 333* 288* 307* 268*    Recent Results (from the past 240 hour(s))  Surgical pcr screen     Status: None   Collection Time: 01/21/22  5:58 AM   Specimen: Nasal Mucosa;  Nasal Swab  Result Value Ref Range Status   MRSA, PCR NEGATIVE NEGATIVE Final   Staphylococcus aureus NEGATIVE NEGATIVE Final    Comment: (NOTE) The Xpert SA Assay (FDA approved for NASAL specimens in patients 86 years of age and older), is one component of a comprehensive surveillance program. It is not intended to diagnose infection nor to guide or monitor treatment. Performed at [facility], [address], [place], [place] [address]      Radiology Studies: No results found.   Marlin Canary DO [facility]  Between 7 am - 7 pm I am available, please contact me via Amion (for emergencies) or Securechat (non urgent messages)  Between 7 pm - 7 am I am not available, please contact night coverage MD/APP via Amion

## 2022-01-24 NOTE — Progress Notes (Signed)
   Subjective: 3 Days Post-Op Procedure(s) (LRB): TOTAL HIP ARTHROPLASTY ANTERIOR APPROACH (Left) Patient reports pain as mild.   Patient seen in rounds for Dr. Charlann Boxer. Patient is sleeping in bed with his daughter sleeping on the couch this morning. No acute events overnight. He made good progress with PT yesterday, and ambulated 40 feet in the last session. The daughter is worried hydrocodone contributed to his dizziness. Voiding without difficulty.  We will continue therapy today.   Objective: Vital signs in last 24 hours: Temp:  [98 F (36.7 C)-98.3 F (36.8 C)] 98 F (36.7 C) (12/13 0350) Pulse Rate:  [63-78] 69 (12/13 0350) Resp:  [17-19] 19 (12/12 2057) BP: (112-140)/(56-97) 128/56 (12/13 0350) SpO2:  [94 %-99 %] 96 % (12/13 0350)  Intake/Output from previous day:  Intake/Output Summary (Last 24 hours) at 01/24/2022 0706 Last data filed at 01/24/2022 0300 Gross per 24 hour  Intake 480 ml  Output 600 ml  Net -120 ml     Intake/Output this shift: No intake/output data recorded.  Labs: Recent Labs    01/22/22 0435 01/23/22 0303 01/24/22 0348  HGB 9.7* 8.8* 8.4*   Recent Labs    01/23/22 0303 01/24/22 0348  WBC 9.4 8.6  RBC 2.78* 2.65*  HCT 25.1* 24.2*  PLT 182 197   Recent Labs    01/23/22 0303 01/24/22 0348  NA 132* 132*  K 5.0 4.8  CL 98 99  CO2 26 25  BUN 33* 43*  CREATININE 1.21 1.69*  GLUCOSE 247* 268*  CALCIUM 8.4* 8.3*   No results for input(s): "LABPT", "INR" in the last 72 hours.  Exam: General - Patient is Alert and Oriented Extremity - Neurologically intact Sensation intact distally Intact pulses distally Dorsiflexion/Plantar flexion intact Dressing - dressing C/D/I Motor Function - intact, moving foot and toes well on exam.   Past Medical History:  Diagnosis Date   Diabetes mellitus without complication (HCC)    Kidney disease     Assessment/Plan: 3 Days Post-Op Procedure(s) (LRB): TOTAL HIP ARTHROPLASTY ANTERIOR APPROACH  (Left) Principal Problem:   Fracture of femoral neck, left, closed (HCC) Active Problems:   Uncontrolled type 2 diabetes mellitus with hyperglycemia, without long-term current use of insulin (HCC)   Fall at home, initial encounter   AKI (acute kidney injury) (HCC)   BPH (benign prostatic hyperplasia)   Hyperlipidemia   S/P total left hip arthroplasty  Estimated body mass index is 21.19 kg/m as calculated from the following:   Height as of this encounter: 5' 10.98" (1.803 m).   Weight as of this encounter: 68.9 kg. Advance diet Up with therapy  DVT Prophylaxis - Aspirin 81 mg BID x4 weeks Weight bearing as tolerated.  ABLA: Hgb stable at 8.4 this AM.   Episodes of dizziness likely related to volume , and I encouraged him to drink plenty of fluids.   Daughter felt hydrocodone contributed, so we will switch to regimen of scheduled tylenol, with methocarbamol and tramadol as needed.  From an orthopaedic standpoint he is cleared for discharge when meeting all goals with PT. My guess is that may be today or more likely tomorrow.  D/C when medically stable.    Dennie Bible, PA-C Orthopedic Surgery [phone] 01/24/2022, 7:06 AM

## 2022-01-24 NOTE — Plan of Care (Signed)
  Problem: Education: Goal: Ability to describe self-care measures that may prevent or decrease complications (Diabetes Survival Skills Education) will improve Outcome: Progressing Goal: Individualized Educational Video(s) Outcome: Progressing   Problem: Coping: Goal: Ability to adjust to condition or change in health will improve Outcome: Progressing   Problem: Fluid Volume: Goal: Ability to maintain a balanced intake and output will improve Outcome: Progressing   Problem: Health Behavior/Discharge Planning: Goal: Ability to identify and utilize available resources and services will improve Outcome: Progressing Goal: Ability to manage health-related needs will improve Outcome: Progressing   Problem: Metabolic: Goal: Ability to maintain appropriate glucose levels will improve Outcome: Progressing   Problem: Nutritional: Goal: Maintenance of adequate nutrition will improve Outcome: Progressing Goal: Progress toward achieving an optimal weight will improve Outcome: Progressing   Problem: Skin Integrity: Goal: Risk for impaired skin integrity will decrease Outcome: Progressing   Problem: Tissue Perfusion: Goal: Adequacy of tissue perfusion will improve Outcome: Progressing   Problem: Education: Goal: Knowledge of General Education information will improve Description: Including pain rating scale, medication(s)/side effects and non-pharmacologic comfort measures Outcome: Progressing   Problem: Health Behavior/Discharge Planning: Goal: Ability to manage health-related needs will improve Outcome: Progressing   Problem: Clinical Measurements: Goal: Ability to maintain clinical measurements within normal limits will improve Outcome: Progressing Goal: Will remain free from infection Outcome: Progressing Goal: Diagnostic test results will improve Outcome: Progressing Goal: Respiratory complications will improve Outcome: Progressing Goal: Cardiovascular complication will  be avoided Outcome: Progressing   Problem: Activity: Goal: Risk for activity intolerance will decrease Outcome: Progressing   Problem: Nutrition: Goal: Adequate nutrition will be maintained Outcome: Progressing   Problem: Coping: Goal: Level of anxiety will decrease Outcome: Progressing   Problem: Elimination: Goal: Will not experience complications related to bowel motility Outcome: Progressing Goal: Will not experience complications related to urinary retention Outcome: Progressing   Problem: Pain Managment: Goal: General experience of comfort will improve Outcome: Progressing   Problem: Safety: Goal: Ability to remain free from injury will improve Outcome: Progressing   Problem: Skin Integrity: Goal: Risk for impaired skin integrity will decrease Outcome: Progressing   Problem: Education: Goal: Knowledge of the prescribed therapeutic regimen will improve Outcome: Progressing Goal: Understanding of discharge needs will improve Outcome: Progressing Goal: Individualized Educational Video(s) Outcome: Progressing   Problem: Activity: Goal: Ability to avoid complications of mobility impairment will improve Outcome: Progressing Goal: Ability to tolerate increased activity will improve Outcome: Progressing   Problem: Clinical Measurements: Goal: Postoperative complications will be avoided or minimized Outcome: Progressing   Problem: Pain Management: Goal: Pain level will decrease with appropriate interventions Outcome: Progressing   Problem: Skin Integrity: Goal: Will show signs of wound healing Outcome: Progressing   

## 2022-01-24 NOTE — Progress Notes (Signed)
Occupational Therapy Treatment Patient Details Name: Paul Colon MRN: [id number] DOB: 11/10/1929 Today's Date: 01/24/2022   History of present illness Pt is a 88 y/o male who presents 01/20/22 s/p fall at home, sustaining a L femoral neck fracture. He is now s/p L THA on 01/21/2022. PMH significant for DM, kidney disease.   OT comments  Focus of session on educating pt and daughter in compensatory strategies for LE bathing and dressing, including use of AE. Verbalized understanding and that AE are private pay items.    Recommendations for follow up therapy are one component of a multi-disciplinary discharge planning process, led by the attending physician.  Recommendations may be updated based on patient status, additional functional criteria and insurance authorization.    Follow Up Recommendations  Home health OT     Assistance Recommended at Discharge Frequent or constant Supervision/Assistance  Patient can return home with the following  A little help with walking and/or transfers;A lot of help with bathing/dressing/bathroom;Assistance with cooking/housework;Assist for transportation;Help with stairs or ramp for entrance   Equipment Recommendations  BSC/3in1    Recommendations for Other Services      Precautions / Restrictions Precautions Precautions: Fall Restrictions Weight Bearing Restrictions: Yes LLE Weight Bearing: Weight bearing as tolerated       Mobility Bed Mobility Overal bed mobility: Needs Assistance Bed Mobility: Supine to Sit     Supine to sit: Mod assist     General bed mobility comments: assist for L LE and to raise trunk from flat bed    Transfers Overall transfer level: Needs assistance Equipment used: Rolling walker (2 wheels) Transfers: Sit to/from Stand Sit to Stand: Min guard           General transfer comment: cues for hand placement     Balance Overall balance assessment: Needs assistance   Sitting balance-Leahy Scale: Fair      Standing balance support: Bilateral upper extremity supported Standing balance-Leahy Scale: Poor Standing balance comment: can release walker in static standing                           ADL either performed or assessed with clinical judgement   ADL Overall ADL's : Needs assistance/impaired               Lower Body Bathing Details (indicate cue type and reason): recommended long handled bath spong to reach L foot Upper Body Dressing : Minimal assistance;Sitting     Lower Body Dressing Details (indicate cue type and reason): educated in sock aid and reacher use, instructed to dress L LE first             Functional mobility during ADLs: Min guard;Rolling walker (2 wheels) General ADL Comments: pt able to release walker in static standing with min guard assist as a precursor to managing pants after toileting and for grooming at sink    Extremity/Trunk Assessment              Vision       Perception     Praxis      Cognition Arousal/Alertness: Awake/alert Behavior During Therapy: WFL for tasks assessed/performed Overall Cognitive Status: Within Functional Limits for tasks assessed                                          Exercises  Shoulder Instructions       General Comments      Pertinent Vitals/ Pain       Pain Assessment Pain Assessment: Faces Faces Pain Scale: Hurts even more Pain Location: L hip during transition to EOB. Pain Descriptors / Indicators: Grimacing, Guarding, Discomfort Pain Intervention(s): Monitored during session, Repositioned  Home Living                                          Prior Functioning/Environment              Frequency  Min 2X/week        Progress Toward Goals  OT Goals(current goals can now be found in the care plan section)  Progress towards OT goals: Progressing toward goals  Acute Rehab OT Goals OT Goal Formulation: With patient Time For  Goal Achievement: 02/05/22 Potential to Achieve Goals: Good  Plan Discharge plan remains appropriate    Co-evaluation                 AM-PAC OT "6 Clicks" Daily Activity     Outcome Measure   Help from another person eating meals?: None Help from another person taking care of personal grooming?: A Little Help from another person toileting, which includes using toliet, bedpan, or urinal?: A Lot Help from another person bathing (including washing, rinsing, drying)?: A Little Help from another person to put on and taking off regular upper body clothing?: A Little Help from another person to put on and taking off regular lower body clothing?: A Lot 6 Click Score: 17    End of Session Equipment Utilized During Treatment: Gait belt;Rolling walker (2 wheels)  OT Visit Diagnosis: Unsteadiness on feet (R26.81);Other abnormalities of gait and mobility (R26.89);Pain;Muscle weakness (generalized) (M62.81) Pain - Right/Left: Right   Activity Tolerance Patient tolerated treatment well   Patient Left in chair;with family/visitor present   Nurse Communication          Time: 1105-1120 OT Time Calculation (min): 15 min  Charges: OT General Charges $OT Visit: 1 Visit OT Treatments $Self Care/Home Management : 8-22 mins  Berna Spare, OTR/L Acute Rehabilitation Services Office: [phone]   Evern Bio 01/24/2022, 12:06 PM

## 2022-01-25 ENCOUNTER — Other Ambulatory Visit (HOSPITAL_COMMUNITY): Payer: Self-pay

## 2022-01-25 LAB — BASIC METABOLIC PANEL
Anion gap: 7 (ref 5–15)
BUN: 33 mg/dL — ABNORMAL HIGH (ref 8–23)
CO2: 27 mmol/L (ref 22–32)
Calcium: 8.4 mg/dL — ABNORMAL LOW (ref 8.9–10.3)
Chloride: 99 mmol/L (ref 98–111)
Creatinine, Ser: 1.3 mg/dL — ABNORMAL HIGH (ref 0.61–1.24)
GFR, Estimated: 52 mL/min — ABNORMAL LOW (ref >60)
Glucose, Bld: 223 mg/dL — ABNORMAL HIGH (ref 70–99)
Potassium: 4.4 mmol/L (ref 3.5–5.1)
Sodium: 133 mmol/L — ABNORMAL LOW (ref 135–145)

## 2022-01-25 LAB — GLUCOSE, CAPILLARY
Glucose-Capillary: 259 mg/dL — ABNORMAL HIGH (ref 70–99)
Glucose-Capillary: 160 mg/dL — ABNORMAL HIGH (ref 70–99)

## 2022-01-25 MED ORDER — TRAMADOL HCL 50 MG PO TABS
50.0000 mg | ORAL_TABLET | Freq: Four times a day (QID) | ORAL | 0 refills | Status: DC | PRN
  Filled 2022-01-25: qty 15, 4d supply, fill #0

## 2022-01-25 MED ORDER — ASPIRIN 81 MG PO CHEW: 81.0000 mg | CHEWABLE_TABLET | Freq: Two times a day (BID) | ORAL | 0 refills | Status: AC

## 2022-01-25 MED ORDER — ACETAMINOPHEN 500 MG PO TABS: 1000.0000 mg | ORAL_TABLET | Freq: Three times a day (TID) | ORAL | 0 refills | Status: AC

## 2022-01-25 MED ORDER — POLYETHYLENE GLYCOL 3350 17 G PO PACK: 17.0000 g | PACK | Freq: Two times a day (BID) | ORAL | 0 refills | Status: DC

## 2022-01-25 MED ORDER — ATORVASTATIN CALCIUM 20 MG PO TABS
20.0000 mg | ORAL_TABLET | Freq: Every evening | ORAL | 0 refills | Status: DC
  Filled 2022-01-25: qty 30, 30d supply, fill #0

## 2022-01-25 MED ORDER — METFORMIN HCL 500 MG PO TABS
500.0000 mg | ORAL_TABLET | Freq: Two times a day (BID) | ORAL | Status: DC
  Administered 2022-01-25: 500 mg via ORAL
  Filled 2022-01-25: qty 1

## 2022-01-25 MED ORDER — DOCUSATE SODIUM 100 MG PO CAPS: 100.0000 mg | ORAL_CAPSULE | Freq: Two times a day (BID) | ORAL | 0 refills | Status: DC

## 2022-01-25 MED ORDER — BISACODYL 10 MG RE SUPP: 10.0000 mg | Freq: Every day | RECTAL | 0 refills | Status: DC | PRN

## 2022-01-25 NOTE — Discharge Summary (Signed)
Physician Discharge Summary  Paul Colon PJA:[id number] DOB: 09-Oct-1929 DOA: 01/20/2022  PCP: [facility]  Admit date: 01/20/2022 Discharge date: 01/25/2022  Admitted From: home Discharge disposition: home   Recommendations for Outpatient Follow-Up:   Asa BID x 30 days Home health Cbc, bmp- 1 week   Discharge Diagnosis:   Principal Problem:   Fracture of femoral neck, left, closed (HCC) Active Problems:   Fall at home, initial encounter   AKI (acute kidney injury) (HCC)   Uncontrolled type 2 diabetes mellitus with hyperglycemia, without long-term current use of insulin (HCC)   BPH (benign prostatic hyperplasia)   Hyperlipidemia   S/P total left hip arthroplasty    Discharge Condition: Improved.  Diet recommendation: Low sodium, heart healthy.  Carbohydrate-modified  Wound care: None.  Code status: Full.   History of Present Illness:   Paul Colon is a 88 y.o. male with medical history significant of diabetes mellitus type 2, neuropathy, and CKD stage III presents after having a fall at home last night while changing his clothes.  At baseline patient ambulates without assistance while in the house and utilizes a rollator when going out.  His daughter makes note that he has neuropathy for which he is unsteady on feet.  Patient denied having any lightheadedness or dizziness prior to the fall last night.  He is not on any blood thinners.  After the fall the patient was unable to bear weight on the left leg and complained of pain in the left hip.  He has been given Tylenol for pain, but did not help much.    Patient has been doing well at home with reports of him having physical therapy with improvement in overall strength.  He had a MRI of his heart as well was wore a Holter monitor earlier this year that noted some abnormality that the daughter is not totally sure of.  They had set him up to have a appointment with cardiology possibly in February or  March.  Patient blood sugars had recently been over 200.  His daughter notes that the patient's last hemoglobin A1c was around 7.  Last time he was in the hospital in July patient had experienced low blood sugars due to insulin given.   Upon admission into the emergency department patient was noted to have stable vital signs.  He was placed on 2 L of nasal cannula oxygen after receiving IV pain medication.  Pelvic x-ray revealed probable left femoral neck fracture.  Chest x-ray noted no acute abnormality.  Labs significant for hemoglobin 11.8, BUN 29, creatinine 1.51, and glucose 281.  Dr. Victorino Dike of orthopedics consulted and planning on possible surgery tomorrow.   Hospital Course by Problem:   Left femoral neck fracture secondary to fall  - orthopedic surgery consulted, appreciate input.  He is status post left total hip replacement by Dr. Charlann Boxer on 12/10.  Recovering well postoperatively, has been placed on aspirin twice daily per orthopedic surgery.   -PT eval- home health   Acute kidney injury  -improved with PO intake   Acute blood loss anemia-postoperatively -trend   BPH- -resumed home meds  Hyperlipidemia -continue statin   DM2, with hyperglycemia- - A1c is 7.3 which is acceptable at his age group.       Medical Consultants:   ortho   Discharge Exam:   Vitals:   01/25/22 0228 01/25/22 0756  BP: 124/69 127/64  Pulse: 77 67  Resp: 19 18  Temp: 98.3 F (36.8  C) 98.6 F (37 C)  SpO2: 97% 97%   Vitals:   01/24/22 1558 01/24/22 1947 01/25/22 0228 01/25/22 0756  BP: (!) 124/54 (!) 121/58 124/69 127/64  Pulse: 68 72 77 67  Resp:  18 19 18   Temp: 97.6 F (36.4 C) 98.4 F (36.9 C) 98.3 F (36.8 C) 98.6 F (37 C)  TempSrc: Oral Oral Oral   SpO2: 96% 96% 97% 97%  Weight:      Height:        General exam: Appears calm and comfortable.    The results of significant diagnostics from this hospitalization (including imaging, microbiology, ancillary and laboratory)  are listed below for reference.     Procedures and Diagnostic Studies:   DG Pelvis Portable  Result Date: 01/21/2022 CLINICAL DATA:  Status post left hip arthroplasty. EXAM: PORTABLE PELVIS 1-2 VIEWS COMPARISON:  01/21/2022 FINDINGS: Postoperative changes from left total hip arthroplasty identified. Hardware components are in anatomic alignment. No signs of periprosthetic fracture. Gas is noted within the soft tissues overlying the proximal left femur. Mild degenerative changes noted within the right hip. Degenerative disc disease within the imaged portions of the lower lumbar spine. IMPRESSION: Status post left total hip arthroplasty. Electronically Signed   By: 14/11/2021 M.D.   On: 01/21/2022 16:14   DG HIP UNILAT WITH PELVIS 1V LEFT  Result Date: 01/21/2022 CLINICAL DATA:  Fluoro guidance provided EXAM: DG HIP (WITH OR WITHOUT PELVIS) 1V*L* FINDINGS: Dose: 1.7 mGy Fluoro time 18s IMPRESSION: C-arm fluoro guidance provided. Electronically Signed   By: 14/11/2021 M.D.   On: 01/21/2022 15:12   DG C-Arm 1-60 Min-No Report  Result Date: 01/21/2022 Fluoroscopy was utilized by the requesting physician.  No radiographic interpretation.   DG Hip Unilat W or Wo Pelvis 2-3 Views Left  Result Date: 01/20/2022 CLINICAL DATA:  Fall, pain EXAM: DG HIP (WITH OR WITHOUT PELVIS) 2-3V LEFT COMPARISON:  None Available. FINDINGS: Mildly displaced and impacted transcervical or basicervical fracture of the left femoral neck. Soft tissues unremarkable. IMPRESSION: Mildly displaced and impacted transcervical or basicervical fracture of the left femoral neck. Electronically Signed   By: 14/10/2021 M.D.   On: 01/20/2022 13:25   DG Chest Portable 1 View  Result Date: 01/20/2022 CLINICAL DATA:  Tripped and fell.  Left hip pain. EXAM: PORTABLE CHEST 1 VIEW COMPARISON:  04/23/2021 FINDINGS: Heart size is normal. Mild aortic tortuosity. The lungs are clear. The vascularity is normal. No effusions. No  abnormal bone finding. IMPRESSION: No active disease. Mild aortic tortuosity. Electronically Signed   By: 06/23/2021 M.D.   On: 01/20/2022 12:15   DG Pelvis 1-2 Views  Result Date: 01/20/2022 CLINICAL DATA:  Tripped and fell.  Left hip pain. EXAM: PELVIS - 1-2 VIEW COMPARISON:  None Available. FINDINGS: Probable femoral neck fracture on the left. Recommend specific hip radiography. No other fracture the pelvic region bones. IMPRESSION: Probable femoral neck fracture on the left. Recommend specific hip radiography. Electronically Signed   By: 14/10/2021 M.D.   On: 01/20/2022 12:15     Labs:   Basic Metabolic Panel: Recent Labs  Lab 01/22/22 0435 01/23/22 0303 01/24/22 0348 01/24/22 1546 01/25/22 0331  NA 132* 132* 132* 132* 133*  K 4.5 5.0 4.8 4.2 4.4  CL 99 98 99 96* 99  CO2 23 26 25 26 27   GLUCOSE 362* 247* 268* 276* 223*  BUN 22 33* 43* 41* 33*  CREATININE 1.45* 1.21 1.69* 1.53* 1.30*  CALCIUM 8.1* 8.4*  8.3* 8.6* 8.4*  MG 1.7 2.0 1.9  --   --    GFR Estimated Creatinine Clearance: 36.8 mL/min (A) (by C-G formula based on SCr of 1.3 mg/dL (H)). Liver Function Tests: Recent Labs  Lab 01/22/22 0435 01/23/22 0303  AST 17 15  ALT 13 9  ALKPHOS 48 51  BILITOT 0.4 0.3  PROT 5.3* 5.2*  ALBUMIN 2.6* 2.7*   No results for input(s): "LIPASE", "AMYLASE" in the last 168 hours. No results for input(s): "AMMONIA" in the last 168 hours. Coagulation profile No results for input(s): "INR", "PROTIME" in the last 168 hours.  CBC: Recent Labs  Lab 01/20/22 1135 01/21/22 0338 01/22/22 0435 01/23/22 0303 01/24/22 0348  WBC 9.0 8.2 10.1 9.4 8.6  NEUTROABS 7.1  --   --   --   --   HGB 11.8* 11.1* 9.7* 8.8* 8.4*  HCT 35.0* 33.5* 28.8* 25.1* 24.2*  MCV 92.3 93.3 92.3 90.3 91.3  PLT 220 176 165 182 197   Cardiac Enzymes: Recent Labs  Lab 01/20/22 1135  CKTOTAL 73   BNP: Invalid input(s): "POCBNP" CBG: Recent Labs  Lab 01/24/22 0804 01/24/22 1141 01/24/22 1639  01/24/22 2013 01/25/22 0759  GLUCAP 268* 227* 266* 242* 259*   D-Dimer No results for input(s): "DDIMER" in the last 72 hours. Hgb A1c No results for input(s): "HGBA1C" in the last 72 hours. Lipid Profile No results for input(s): "CHOL", "HDL", "LDLCALC", "TRIG", "CHOLHDL", "LDLDIRECT" in the last 72 hours. Thyroid function studies No results for input(s): "TSH", "T4TOTAL", "T3FREE", "THYROIDAB" in the last 72 hours.  Invalid input(s): "FREET3" Anemia work up No results for input(s): "VITAMINB12", "FOLATE", "FERRITIN", "TIBC", "IRON", "RETICCTPCT" in the last 72 hours. Microbiology Recent Results (from the past 240 hour(s))  Surgical pcr screen     Status: None   Collection Time: 01/21/22  5:58 AM   Specimen: Nasal Mucosa; Nasal Swab  Result Value Ref Range Status   MRSA, PCR NEGATIVE NEGATIVE Final   Staphylococcus aureus NEGATIVE NEGATIVE Final    Comment: (NOTE) The Xpert SA Assay (FDA approved for NASAL specimens in patients 86 years of age and older), is one component of a comprehensive surveillance program. It is not intended to diagnose infection nor to guide or monitor treatment. Performed at [facility], [address] [place], [place] [address]      Discharge Instructions:   Discharge Instructions     Diet Carb Modified   Complete by: As directed    Discharge instructions   Complete by: As directed    Aspirin 81 mg BID x4 weeks   Increase activity slowly   Complete by: As directed    No wound care   Complete by: As directed       Allergies as of 01/25/2022       Reactions   Alogliptin Other (See Comments)   Unknown reaction    Penicillin G Rash        Medication List     STOP taking these medications    aspirin EC 81 MG tablet Replaced by: aspirin 81 MG chewable tablet       TAKE these medications    acetaminophen 500 MG tablet Commonly known as: TYLENOL Take 2 tablets (1,000 mg total) by mouth 3 (three) times daily for 6  days.   aspirin 81 MG chewable tablet Chew 1 tablet (81 mg total) by mouth 2 (two) times daily for 27 days. Replaces: aspirin EC 81 MG tablet   atorvastatin  20 MG tablet Commonly known as: LIPITOR Take 1 tablet (20 mg total) by mouth every evening. What changed:  medication strength how much to take when to take this additional instructions   bisacodyl 10 MG suppository Commonly known as: DULCOLAX Place 1 suppository (10 mg total) rectally daily as needed for moderate constipation.   docusate sodium 100 MG capsule Commonly known as: COLACE Take 1 capsule (100 mg total) by mouth 2 (two) times daily.   dorzolamide-timolol 2-0.5 % ophthalmic solution Commonly known as: COSOPT Place 1 drop into both eyes 2 (two) times daily. For glaucoma   finasteride 5 MG tablet Commonly known as: PROSCAR Take 5 mg by mouth daily. For enlarged prostate   metFORMIN 500 MG tablet Commonly known as: GLUCOPHAGE Take 1 tablet (500 mg total) by mouth 2 (two) times daily with a meal. What changed: additional instructions   ONE-A-DAY MENS 50+ PO Take 1 tablet by mouth in the morning.   polyethylene glycol 17 g packet Commonly known as: MIRALAX / GLYCOLAX Take 17 g by mouth 2 (two) times daily.   SAW PALMETTO PO Take 2 capsules by mouth in the morning.   traMADol 50 MG tablet Commonly known as: ULTRAM Take 1 tablet (50 mg total) by mouth every 6 (six) hours as needed for moderate pain.        Follow-up Information     [facility] Follow [facility].   Contact information: [address] [place] [address] [phone]         Durene Romans, MD. Schedule an appointment as soon as possible for a visit in 2 week(s).   Specialty: Orthopedic Surgery Contact information: [address] [place] [address] [place] [address] [phone]                  Time coordinating discharge: 45 min  Signed:  Joseph Art DO  [facility] 01/25/2022, 8:21 AM

## 2022-01-25 NOTE — TOC Progression Note (Signed)
Transition of Care [facility]) - Progression Note    Patient Details  Name: Paul Colon MRN: [id number] Date of Birth: Mar 18, 1929  Transition of Care [facility]) CM/SW Contact  Epifanio Lesches, RN Phone Number: 01/25/2022, 11:23 AM  Clinical Narrative:    NCM spoke with pt's daughter regarding d/c planning. Orders noted for home health and DME. NCM provided choice for [facility] provider. [facility] selected. Referral made with Tresa Endo with [facility] and accepted.Daughter ok for secondary insurance to be used for DME: RW and BSC. Referral made with [facility] with [employer] and accepted. Equipment will be delivered to bedside prior to d/c. [facility] pharmacy will deliver RX meds to bedside prior to d/c. Post hospital f/u noted on AVS. [facility]ink, PCP: Corky Crafts,  [place], [phone], [phone].  Daughter to provide transportation to home.  Expected Discharge Plan: Home w Home Health Services [facility] versus SNF) Barriers to Discharge: No Barriers Identified  Expected Discharge Plan and Services Expected Discharge Plan: Home w Home Health Services [facility] versus SNF)       Living arrangements for the past 2 months: Single Family Home Expected Discharge Date: 01/25/22               DME Arranged: Cherre Huger rolling DME Agency: [employer] Date DME Agency Contacted: 01/25/22 Time DME Agency Contacted: 1123 Representative spoke with at DME Agency: Vaughan Basta HH Arranged: PT, OT HH Agency: [facility] Date [facility] Agency Contacted: 01/25/22 Time HH Agency Contacted: [phone] Representative spoke with at [facility] Agency: Tresa Endo   Social Determinants of Health (SDOH) Interventions    Readmission Risk Interventions     No data to display

## 2022-01-25 NOTE — Progress Notes (Signed)
Physical Therapy Treatment Patient Details Name: Paul Colon MRN: [id number] DOB: Mar 06, 1929 Today's Date: 01/25/2022   History of Present Illness Pt is a 88 y/o male who presents 01/20/22 s/p fall at home, sustaining a L femoral neck fracture. He is now s/p L THA on 01/21/2022. PMH significant for DM, kidney disease.    PT Comments    Pt progressing well towards physical therapy goals. Focus of session was ambulation to bathroom for BM and peri-care, and repositioning in the chair to finish lunch. Pt motivated to participate and attempted peri-care. Required assist to completely clean himself. Therapist adjusted walker for home and placed plastic sliders over the rubber stoppers on the bottom of the RW. Pt reports no further questions at this time. Daughter not present during session and called therapist after session to review adaptive equipment recommendations. She was also educated on HEP frequency and appropriate activity progression. Will continue to follow and progress as able per POC.     Recommendations for follow up therapy are one component of a multi-disciplinary discharge planning process, led by the attending physician.  Recommendations may be updated based on patient status, additional functional criteria and insurance authorization.  Follow Up Recommendations  Home health PT     Assistance Recommended at Discharge Frequent or constant Supervision/Assistance  Patient can return home with the following A little help with walking and/or transfers;A little help with bathing/dressing/bathroom;Assistance with cooking/housework;Assist for transportation;Help with stairs or ramp for entrance   Equipment Recommendations  Rolling walker (2 wheels);BSC/3in1    Recommendations for Other Services       Precautions / Restrictions Precautions Precautions: Fall Precaution Comments: watch BP Restrictions Weight Bearing Restrictions: Yes LLE Weight Bearing: Weight bearing as tolerated      Mobility  Bed Mobility Overal bed mobility: Needs Assistance Bed Mobility: Supine to Sit     Supine to sit: Mod assist     General bed mobility comments: Assist to advance L LE towards EOB and to raise trunk. Increased time required throughout.    Transfers Overall transfer level: Needs assistance Equipment used: Rolling walker (2 wheels) Transfers: Sit to/from Stand Sit to Stand: Min guard           General transfer comment: VC's for hand placement on seated surface for safety.    Ambulation/Gait Ambulation/Gait assistance: Min assist Gait Distance (Feet): 30 Feet Assistive device: Rolling walker (2 wheels) Gait Pattern/deviations: Step-to pattern, Decreased stride length, Shuffle, Trunk flexed, Step-through pattern, Narrow base of support Gait velocity: Decreased Gait velocity interpretation: <1.31 ft/sec, indicative of household ambulator   General Gait Details: Pt ambulated to the bathroom and back to the recliner. Occasional assist required for walker management but overall no overt LOB noted.   Stairs             Wheelchair Mobility    Modified Rankin (Stroke Patients Only)       Balance Overall balance assessment: Needs assistance Sitting-balance support: Single extremity supported, Feet supported Sitting balance-Leahy Scale: Fair     Standing balance support: Bilateral upper extremity supported Standing balance-Leahy Scale: Poor Standing balance comment: Dynamically - can release walker in static standing                            Cognition Arousal/Alertness: Awake/alert Behavior During Therapy: WFL for tasks assessed/performed Overall Cognitive Status: Within Functional Limits for tasks assessed  Exercises      General Comments        Pertinent Vitals/Pain Pain Assessment Pain Assessment: Faces Faces Pain Scale: Hurts even more Pain Location: L hip  during transition to EOB. Pain Descriptors / Indicators: Grimacing, Guarding, Discomfort Pain Intervention(s): Limited activity within patient's tolerance, Monitored during session, Repositioned    Home Living                          Prior Function            PT Goals (current goals can now be found in the care plan section) Acute Rehab PT Goals Patient Stated Goal: Home with daughter at d/c PT Goal Formulation: With patient Time For Goal Achievement: 01/29/22 Potential to Achieve Goals: Good Progress towards PT goals: Progressing toward goals    Frequency    Min 5X/week      PT Plan Current plan remains appropriate    Co-evaluation              AM-PAC PT "6 Clicks" Mobility   Outcome Measure  Help needed turning from your back to your side while in a flat bed without using bedrails?: A Lot Help needed moving from lying on your back to sitting on the side of a flat bed without using bedrails?: A Lot Help needed moving to and from a bed to a chair (including a wheelchair)?: A Little Help needed standing up from a chair using your arms (e.g., wheelchair or bedside chair)?: A Little Help needed to walk in hospital room?: A Little Help needed climbing 3-5 steps with a railing? : A Little 6 Click Score: 16    End of Session Equipment Utilized During Treatment: Gait belt Activity Tolerance: Patient tolerated treatment well Patient left: in chair;with call bell/phone within reach;with chair alarm set Nurse Communication: Mobility status PT Visit Diagnosis: Unsteadiness on feet (R26.81);Pain Pain - Right/Left: Left Pain - part of body: Hip     Time: 0263-7858 PT Time Calculation (min) (ACUTE ONLY): 30 min  Charges:  $Gait Training: 23-37 mins                     Paul Colon, PT, DPT Acute Rehabilitation Services Secure Chat Preferred Office: [phone]    Paul Colon 01/25/2022, 2:23 PM

## 2022-01-25 NOTE — Progress Notes (Addendum)
Waiting for pt's daughter to return from her appointment to discharge pt.  AVS has been printed. Unit Nurse will need to review d/c instructions with pt and daughter.

## 2022-03-20 ENCOUNTER — Ambulatory Visit: Payer: No Typology Code available for payment source | Admitting: Neurology

## 2022-08-24 ENCOUNTER — Other Ambulatory Visit: Payer: Self-pay

## 2022-08-24 ENCOUNTER — Encounter (HOSPITAL_BASED_OUTPATIENT_CLINIC_OR_DEPARTMENT_OTHER): Payer: Self-pay

## 2022-08-24 ENCOUNTER — Emergency Department (HOSPITAL_BASED_OUTPATIENT_CLINIC_OR_DEPARTMENT_OTHER)
Admission: EM | Admit: 2022-08-24 | Discharge: 2022-08-24 | Disposition: A | Discharge status: Home / Self Care | Payer: No Typology Code available for payment source | Attending: Emergency Medicine | Admitting: Emergency Medicine

## 2022-08-24 ENCOUNTER — Emergency Department (HOSPITAL_BASED_OUTPATIENT_CLINIC_OR_DEPARTMENT_OTHER): Payer: No Typology Code available for payment source

## 2022-08-24 DIAGNOSIS — W01198A Fall on same level from slipping, tripping and stumbling with subsequent striking against other object, initial encounter: Secondary | ICD-10-CM | POA: Insufficient documentation

## 2022-08-24 DIAGNOSIS — E119 Type 2 diabetes mellitus without complications: Secondary | ICD-10-CM | POA: Insufficient documentation

## 2022-08-24 DIAGNOSIS — S0003XA Contusion of scalp, initial encounter: Secondary | ICD-10-CM | POA: Diagnosis not present

## 2022-08-24 DIAGNOSIS — Z96642 Presence of left artificial hip joint: Secondary | ICD-10-CM | POA: Diagnosis not present

## 2022-08-24 DIAGNOSIS — S0990XA Unspecified injury of head, initial encounter: Secondary | ICD-10-CM | POA: Diagnosis present

## 2022-08-24 DIAGNOSIS — S60312A Abrasion of left thumb, initial encounter: Secondary | ICD-10-CM | POA: Diagnosis not present

## 2022-08-24 DIAGNOSIS — W19XXXA Unspecified fall, initial encounter: Secondary | ICD-10-CM

## 2022-08-24 NOTE — ED Triage Notes (Signed)
Pt mis stepped when he was going to bed and fell hitting the back of his head. Has a knot on the left side with a mark that looks like he hit the step.  Is not on blood thinners.

## 2022-08-24 NOTE — Discharge Instructions (Signed)
You were evaluated in the Emergency Department and after careful evaluation, we did not find any emergent condition requiring admission or further testing in the hospital.  Your exam/testing today was overall reassuring.  CT scan did not show any emergent injuries.  Please return to the Emergency Department if you experience any worsening of your condition.  Thank you for allowing [place] to be a part of your care.

## 2022-08-24 NOTE — ED Provider Notes (Signed)
[facility] EMERGENCY [facility] Emergency Department Provider Note MRN:  [id number]  Arrival date & time: 08/24/22     Chief Complaint   Fall   History of Present Illness   Paul Colon is a 89 y.o. year-old male with a history of diabetes presenting to the ED with chief complaint of fall.  Tripped on the carpet and fell.  Hit the back of his head.  Has a hematoma.  No loss of consciousness, no nausea or vomiting, no neck pain or back pain, no chest pain or shortness of breath, no abdominal pain, no injuries to the arms or legs, small abrasion to the left thumb.  Review of Systems  A thorough review of systems was obtained and all systems are negative except as noted in the HPI and PMH.   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 No pertinent family history.  Social History   Socioeconomic History   Marital status: Widowed    Spouse name: Not on file   Number of children: Not on file   Years of education: Not on file   Highest education level: Not on file  Occupational History   Not on file  Tobacco Use   Smoking status: Never   Smokeless tobacco: Never  Vaping Use   Vaping status: Never Used  Substance and Sexual Activity   Alcohol use: Never   Drug use: Never   Sexual activity: Not on file  Other Topics Concern   Not on file  Social History Narrative   Not on file   Social Determinants of Health   Financial Resource Strain: Not on file  Food Insecurity: No Food Insecurity (01/20/2022)   Hunger Vital Sign    Worried About Running Out of Food in the Last Year: Never true    Ran Out of Food in the Last Year:  Never true  Transportation Needs: No Transportation Needs (01/20/2022)   PRAPARE - Administrator, Civil Service (Medical): No    Lack of Transportation (Non-Medical): No  Physical Activity: Not on file  Stress: Not on file  Social Connections: Unknown (09/26/2021)   Received from [facility]   Social Network    Social Network: Not on file  Intimate Partner Violence: Not At Risk (01/20/2022)   Humiliation, Afraid, Rape, and Kick questionnaire    Fear of Current or Ex-Partner: No    Emotionally Abused: No    Physically Abused: No    Sexually Abused: No     Physical Exam   Vitals:   08/24/22 0145 08/24/22 0200  BP: (!) 169/73 (!) 180/71  Pulse: (!) 59 62  Resp:    Temp:    SpO2: 98% 100%    CONSTITUTIONAL: Well-appearing, NAD NEURO/PSYCH:  Alert and oriented x 3, no focal deficits EYES:  eyes equal and reactive ENT/NECK:  no LAD, no JVD CARDIO: Regular rate, well-perfused, normal S1 and S2 PULM:  CTAB no wheezing or rhonchi GI/GU:  non-distended, non-tender MSK/SPINE:  No gross deformities, no edema SKIN: Hematoma to occipital scalp, small abrasion to the left thumb   *Additional and/or pertinent findings included in  MDM below  Diagnostic and Interventional Summary    EKG Interpretation Date/Time:    Ventricular Rate:    PR Interval:    QRS Duration:    QT Interval:    QTC Calculation:   R Axis:      Text Interpretation:         Labs Reviewed - No data to display  CT HEAD WO CONTRAST ( )  Final Result      Medications - No data to display   Procedures  /  Critical Care Procedures  ED Course and Medical Decision Making  Initial Impression and Ddx Mechanical fall, hematoma, advanced age, obtaining CT to exclude subdural hematoma or other emergent process.  Normal range of motion of the neck, no midline tenderness, otherwise no other traumatic concerns.  Past medical/surgical history that increases complexity of ED  encounter: Diabetes  Interpretation of Diagnostics CT head normal  Patient Reassessment and Ultimate Disposition/Management     Discharge  Patient management required discussion with the following services or consulting groups:  None  Complexity of Problems Addressed Acute illness or injury that poses threat of life of bodily function  Additional Data Reviewed and Analyzed Further history obtained from: Further history from spouse/family member  Additional Factors Impacting ED Encounter Risk None  Elmer Sow. Pilar Plate, MD [facility] Emergency Medicine [facility] [email or web address]  Final Clinical Impressions(s) / ED Diagnoses     ICD-10-CM   1. Fall, initial encounter  W19.XXXA     2. Hematoma of scalp, initial encounter  S00.03XA     3. Abrasion of left thumb, initial encounter  Z61.096E       ED Discharge Orders     None        Discharge Instructions Discussed with and Provided to Patient:    Discharge Instructions      You were evaluated in the Emergency Department and after careful evaluation, we did not find any emergent condition requiring admission or further testing in the hospital.  Your exam/testing today was overall reassuring.  CT scan did not show any emergent injuries.  Please return to the Emergency Department if you experience any worsening of your condition.  Thank you for allowing [place] to be a part of your care.       Sabas Sous, MD 08/24/22 [phone]

## 2022-10-05 ENCOUNTER — Other Ambulatory Visit: Payer: Self-pay

## 2022-10-05 ENCOUNTER — Inpatient Hospital Stay (HOSPITAL_COMMUNITY)
Admission: EM | Admit: 2022-10-05 | Discharge: 2022-10-07 | DRG: 641 | Disposition: A | Discharge status: Home / Self Care | Payer: No Typology Code available for payment source | Source: Ambulatory Visit | Attending: Internal Medicine | Admitting: Internal Medicine

## 2022-10-05 ENCOUNTER — Encounter (HOSPITAL_COMMUNITY): Payer: Self-pay | Admitting: Emergency Medicine

## 2022-10-05 DIAGNOSIS — Z96642 Presence of left artificial hip joint: Secondary | ICD-10-CM | POA: Diagnosis present

## 2022-10-05 DIAGNOSIS — E1165 Type 2 diabetes mellitus with hyperglycemia: Secondary | ICD-10-CM | POA: Diagnosis not present

## 2022-10-05 DIAGNOSIS — E1136 Type 2 diabetes mellitus with diabetic cataract: Secondary | ICD-10-CM | POA: Diagnosis present

## 2022-10-05 DIAGNOSIS — E785 Hyperlipidemia, unspecified: Secondary | ICD-10-CM | POA: Diagnosis not present

## 2022-10-05 DIAGNOSIS — N400 Enlarged prostate without lower urinary tract symptoms: Secondary | ICD-10-CM | POA: Diagnosis not present

## 2022-10-05 DIAGNOSIS — Z79899 Other long term (current) drug therapy: Secondary | ICD-10-CM

## 2022-10-05 DIAGNOSIS — N1831 Chronic kidney disease, stage 3a: Secondary | ICD-10-CM | POA: Diagnosis present

## 2022-10-05 DIAGNOSIS — E871 Hypo-osmolality and hyponatremia: Principal | ICD-10-CM | POA: Diagnosis present

## 2022-10-05 DIAGNOSIS — E1122 Type 2 diabetes mellitus with diabetic chronic kidney disease: Secondary | ICD-10-CM | POA: Diagnosis present

## 2022-10-05 DIAGNOSIS — H9190 Unspecified hearing loss, unspecified ear: Secondary | ICD-10-CM | POA: Diagnosis present

## 2022-10-05 DIAGNOSIS — I129 Hypertensive chronic kidney disease with stage 1 through stage 4 chronic kidney disease, or unspecified chronic kidney disease: Secondary | ICD-10-CM | POA: Diagnosis present

## 2022-10-05 DIAGNOSIS — I10 Essential (primary) hypertension: Secondary | ICD-10-CM

## 2022-10-05 DIAGNOSIS — H409 Unspecified glaucoma: Secondary | ICD-10-CM | POA: Diagnosis present

## 2022-10-05 DIAGNOSIS — Z880 Allergy status to penicillin: Secondary | ICD-10-CM

## 2022-10-05 DIAGNOSIS — Z888 Allergy status to other drugs, medicaments and biological substances status: Secondary | ICD-10-CM

## 2022-10-05 DIAGNOSIS — Z7984 Long term (current) use of oral hypoglycemic drugs: Secondary | ICD-10-CM

## 2022-10-05 DIAGNOSIS — Z9849 Cataract extraction status, unspecified eye: Secondary | ICD-10-CM

## 2022-10-05 LAB — COMPREHENSIVE METABOLIC PANEL
ALT: 12 U/L (ref 0–44)
AST: 16 U/L (ref 15–41)
Albumin: 3.7 g/dL (ref 3.5–5.0)
Alkaline Phosphatase: 80 U/L (ref 38–126)
Anion gap: 14 (ref 5–15)
BUN: 19 mg/dL (ref 8–23)
CO2: 23 mmol/L (ref 22–32)
Calcium: 8.9 mg/dL (ref 8.9–10.3)
Chloride: 87 mmol/L — ABNORMAL LOW (ref 98–111)
Creatinine, Ser: 1.16 mg/dL (ref 0.61–1.24)
GFR, Estimated: 59 mL/min — ABNORMAL LOW (ref >60)
Glucose, Bld: 148 mg/dL — ABNORMAL HIGH (ref 70–99)
Potassium: 4.2 mmol/L (ref 3.5–5.1)
Sodium: 124 mmol/L — ABNORMAL LOW (ref 135–145)
Total Bilirubin: 0.4 mg/dL (ref 0.3–1.2)
Total Protein: 6.4 g/dL — ABNORMAL LOW (ref 6.5–8.1)

## 2022-10-05 LAB — CBC
HCT: 34.5 % — ABNORMAL LOW (ref 39.0–52.0)
Hemoglobin: 11.4 g/dL — ABNORMAL LOW (ref 13.0–17.0)
MCH: 30.2 pg (ref 26.0–34.0)
MCHC: 33 g/dL (ref 30.0–36.0)
MCV: 91.3 fL (ref 80.0–100.0)
Platelets: 302 10*3/uL (ref 150–400)
RBC: 3.78 MIL/uL — ABNORMAL LOW (ref 4.22–5.81)
RDW: 12.2 % (ref 11.5–15.5)
WBC: 7.3 10*3/uL (ref 4.0–10.5)
nRBC: 0 % (ref 0.0–0.2)

## 2022-10-05 LAB — NA AND K (SODIUM & POTASSIUM), RAND UR
Potassium Urine: 34 mmol/L
Sodium, Ur: 49 mmol/L

## 2022-10-05 MED ORDER — PREDNISOLONE ACETATE 1 % OP SUSP
1.0000 [drp] | OPHTHALMIC | Status: DC
  Administered 2022-10-06: 1 [drp] via OPHTHALMIC
  Filled 2022-10-05: qty 5

## 2022-10-05 MED ORDER — ATORVASTATIN CALCIUM 10 MG PO TABS
10.0000 mg | ORAL_TABLET | Freq: Every day | ORAL | Status: DC
  Administered 2022-10-06: 10 mg via ORAL
  Filled 2022-10-05: qty 1

## 2022-10-05 MED ORDER — DORZOLAMIDE HCL-TIMOLOL MAL 2-0.5 % OP SOLN
1.0000 [drp] | Freq: Two times a day (BID) | OPHTHALMIC | Status: DC
  Administered 2022-10-06 – 2022-10-07 (×2): 1 [drp] via OPHTHALMIC
  Filled 2022-10-05: qty 10

## 2022-10-05 MED ORDER — FINASTERIDE 5 MG PO TABS
5.0000 mg | ORAL_TABLET | Freq: Every day | ORAL | Status: DC
  Administered 2022-10-06: 5 mg via ORAL
  Filled 2022-10-05: qty 1

## 2022-10-05 MED ORDER — MOXIFLOXACIN HCL 0.5 % OP SOLN
1.0000 [drp] | Freq: Four times a day (QID) | OPHTHALMIC | Status: DC
  Administered 2022-10-06 – 2022-10-07 (×3): 1 [drp] via OPHTHALMIC
  Filled 2022-10-05: qty 3

## 2022-10-05 MED ORDER — METFORMIN HCL 500 MG PO TABS
1000.0000 mg | ORAL_TABLET | Freq: Two times a day (BID) | ORAL | Status: DC
  Administered 2022-10-06 – 2022-10-07 (×2): 1000 mg via ORAL
  Filled 2022-10-05 (×2): qty 2

## 2022-10-05 MED ORDER — NON FORMULARY: 1.0000 [drp] | Freq: Four times a day (QID) | Status: DC

## 2022-10-05 NOTE — Assessment & Plan Note (Signed)
-  A1C last year of 7.3  -continue metformin. Daughter refuses for him to be on sliding scale insulin and will be giving him metformin here.

## 2022-10-05 NOTE — Assessment & Plan Note (Signed)
-  creatinine is stable at 1.16 which is likely his baseline

## 2022-10-05 NOTE — ED Triage Notes (Signed)
Pt presents sent from doctor for low sodium. No symptoms. Family reports pt eats healthy and exercises.

## 2022-10-05 NOTE — ED Notes (Signed)
ED TO INPATIENT HANDOFF REPORT  ED Nurse Name and Phone #: Juliette Alcide RN [phone]  S Name/Age/Gender Paul Colon 89 y.o. male Room/Bed: TRAAC/TRAAC  Code Status   Code Status: Prior  Home/SNF/Other Home Patient oriented to: self, place, time, and situation Is this baseline? Yes   Triage Complete: Triage complete  Chief Complaint Hyponatremia [E87.1]  Triage Note Pt presents sent from doctor for low sodium. No symptoms. Family reports pt eats healthy and exercises.    Allergies Allergies  Allergen Reactions   Alogliptin Other (See Comments)    Unknown reaction    Penicillin G Rash    Level of Care/Admitting Diagnosis ED Disposition     ED Disposition  Admit   Condition  --   Comment  Hospital Area: [facility] [[id number]]  Level of Care: Med-Surg [16]  May place patient in observation at [facility] or [facility] if equivalent level of care is available:: No  Covid Evaluation: Asymptomatic - no recent exposure (last 10 days) testing not required  Diagnosis: Hyponatremia [198519]  Admitting Physician: Anselm Jungling [[id number]]  Attending Physician: Anselm Jungling [[id number]]          B Medical/Surgery History Past Medical History:  Diagnosis Date   Diabetes mellitus without complication (HCC)    Kidney disease    Past Surgical History:  Procedure Laterality Date   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   Procedure: TOTAL HIP ARTHROPLASTY ANTERIOR APPROACH;  Surgeon: Durene Romans, MD;  Location: [facility] OR;  Service: Orthopedics;  Laterality: Left;     A IV Location/Drains/Wounds Patient Lines/Drains/Airways Status     Active Line/Drains/Airways     Name Placement date Placement time Site Days   Peripheral IV 09/06/21 20 G Anterior;Distal;Right Forearm 09/06/21  0550  Forearm  394   Peripheral IV 01/20/22 20 G Anterior;Proximal;Right Forearm 01/20/22  1235  Forearm  258            Intake/Output Last 24 hours No intake or output data in  the 24 hours ending 10/05/22 2246  Labs/Imaging Results for orders placed or performed during the hospital encounter of 10/05/22 (from the past 48 hour(s))  Comprehensive metabolic panel     Status: Abnormal   Collection Time: 10/05/22  6:33 PM  Result Value Ref Range   Sodium 124 (L) 135 - 145 mmol/L   Potassium 4.2 3.5 - 5.1 mmol/L   Chloride 87 (L) 98 - 111 mmol/L   CO2 23 22 - 32 mmol/L   Glucose, Bld 148 (H) 70 - 99 mg/dL    Comment: Glucose reference range applies only to samples taken after fasting for at least 8 hours.   BUN 19 8 - 23 mg/dL   Creatinine, Ser 0.62 0.61 - 1.24 mg/dL   Calcium 8.9 8.9 - 37.6 mg/dL   Total Protein 6.4 (L) 6.5 - 8.1 g/dL   Albumin 3.7 3.5 - 5.0 g/dL   AST 16 15 - 41 U/L   ALT 12 0 - 44 U/L   Alkaline Phosphatase 80 38 - 126 U/L   Total Bilirubin 0.4 0.3 - 1.2 mg/dL   GFR, Estimated 59 (L) >60 mL/min    Comment: (NOTE) Calculated using the CKD-EPI Creatinine Equation (2021)    Anion gap 14 5 - 15    Comment: Performed at [facility], [address] [place], [place] [address]  CBC     Status: Abnormal   Collection Time: 10/05/22  6:33 PM  Result Value Ref  Range   WBC 7.3 4.0 - 10.5 K/uL   RBC 3.78 (L) 4.22 - 5.81 MIL/uL   Hemoglobin 11.4 (L) 13.0 - 17.0 g/dL   HCT 82.9 (L) 56.2 - 13.0 %   MCV 91.3 80.0 - 100.0 fL   MCH 30.2 26.0 - 34.0 pg   MCHC 33.0 30.0 - 36.0 g/dL   RDW 86.5 78.4 - 69.6 %   Platelets 302 150 - 400 K/uL   nRBC 0.0 0.0 - 0.2 %    Comment: Performed at [facility], [address] [place], [place] [address]   No results found.  Pending Labs Unresulted Labs (From admission, onward)     Start     Ordered   10/05/22 2150  Osmolality  Once,   URGENT       Comments: Draw when urine sample is obtained    10/05/22 2150   10/05/22 2150  Osmolality, urine  Once,   URGENT        10/05/22 2150   10/05/22 2150  Na and K (sodium & potassium), rand urine  Once,   URGENT        10/05/22 2150             Vitals/Pain Today's Vitals   10/05/22 1825 10/05/22 1825 10/05/22 1826 10/05/22 2152  BP:  131/69  (!) 148/65  Pulse:  86  71  Resp:  16  11  Temp:   97.8 F (36.6 C)   TempSrc:   Oral   SpO2:  100%  100%  PainSc: 0-No pain       Isolation Precautions No active isolations  Medications Medications - No data to display  Mobility Walks with device     Focused Assessments Cardiac Assessment Handoff:    Lab Results  Component Value Date   CKTOTAL 73 01/20/2022   No results found for: "DDIMER" Does the Patient currently have chest pain? No   , Neuro Assessment Handoff:  Swallow screen pass? Yes          Neuro Assessment:   Neuro Checks:      Has TPA been given? No If patient is a Neuro Trauma and patient is going to OR before floor call report to 4N Charge nurse: [phone] or [phone]   R Recommendations: See Admitting Provider Note  Report given to:   Additional Notes: Pt A&Ox4. Ambulatory. Continent. Family reports that pt usually ambulates by self but has been using a walker recently due to weakness.

## 2022-10-05 NOTE — ED Provider Notes (Signed)
[place] EMERGENCY DEPARTMENT AT [facility] Provider Note   CSN: [id number] Arrival date & time: 10/05/22  1802     History  Chief Complaint  Patient presents with   Abnormal Labs    Paul Colon is a 89 y.o. male.  HPI   Patient is a 89yM w/ PMHx of Type 2 diabetes, CKD 3a, BPH, HLD, cataract, presenting today for concerns of generalized weakness as well as hyponatremia.  Family states that he has been having worsening weakness for the last 2 weeks.  He was going today for an eye appointment regarding recent cataract procedure, went to urgent care regarding his worsening weakness.  He had a CT of his head that was documented as negative.  His blood work however showed that his sodium was 124.  Family states this has happened once in the past.  He was hospitalized in 08/2021 for hyponatremia.  At that time, he was fluid restricted and slowly improved.  He denies any headaches, numbness, or weakness.  He denies any chest pain or shortness of breath.  He has no cough or hemoptysis.  He has no new medications.  Following the lab results, they were directed to the ED for further evaluation.  Home Medications Prior to Admission medications   Medication Sig Start Date End Date Taking? Authorizing Provider  atorvastatin (LIPITOR) 20 MG tablet Take 1 tablet (20 mg total) by mouth every evening. 01/25/22   Joseph Art, DO  bisacodyl (DULCOLAX) 10 MG suppository Place 1 suppository (10 mg total) rectally daily as needed for moderate constipation. 01/25/22   Joseph Art, DO  docusate sodium (COLACE) 100 MG capsule Take 1 capsule (100 mg total) by mouth 2 (two) times daily. 01/25/22   Joseph Art, DO  dorzolamide-timolol (COSOPT) 22.3-6.8 MG/ML ophthalmic solution Place 1 drop into both eyes 2 (two) times daily. For glaucoma    [provider]  finasteride (PROSCAR) 5 MG tablet Take 5 mg by mouth daily. For enlarged prostate    [provider]  metFORMIN  (GLUCOPHAGE) 500 MG tablet Take 1 tablet (500 mg total) by mouth 2 (two) times daily with a meal. Patient taking differently: Take 500 mg by mouth 2 (two) times daily with a meal. For diabetes -- recheck renal failure every 3 months 04/23/21   Margarita Grizzle, MD  Multiple Vitamins-Minerals (ONE-A-DAY MENS 50+ PO) Take 1 tablet by mouth in the morning.    [provider]  polyethylene glycol (MIRALAX / GLYCOLAX) 17 g packet Take 17 g by mouth 2 (two) times daily. 01/25/22   Joseph Art, DO  Saw Palmetto, Serenoa repens, (SAW PALMETTO PO) Take 2 capsules by mouth in the morning.    [provider]  traMADol (ULTRAM) 50 MG tablet Take 1 tablet (50 mg total) by mouth every 6 (six) hours as needed for moderate pain. 01/25/22   Joseph Art, DO      Allergies    Alogliptin and Penicillin g    Review of Systems   Review of Systems Negative except as noted above in HPI  Physical Exam Updated Vital Signs BP (!) 153/65   Pulse 69   Temp 98.2 F (36.8 C) (Oral)   Resp 16   SpO2 98%  Physical Exam Vitals and nursing note reviewed.  Constitutional:      General: He is not in acute distress.    Appearance: He is well-developed.  HENT:     Head: Normocephalic and atraumatic.  Mouth/Throat:     Mouth: Mucous membranes are moist.  Eyes:     Conjunctiva/sclera: Conjunctivae normal.     Pupils: Pupils are equal, round, and reactive to light.  Cardiovascular:     Rate and Rhythm: Normal rate and regular rhythm.     Heart sounds: No murmur heard. Pulmonary:     Effort: Pulmonary effort is normal. No respiratory distress.     Breath sounds: Normal breath sounds.     Comments: Saturating well on room air Abdominal:     Palpations: Abdomen is soft.     Tenderness: There is no abdominal tenderness.  Musculoskeletal:        General: No swelling or tenderness.     Cervical back: Neck supple.  Skin:    General: Skin is warm and dry.     Capillary Refill: Capillary  refill takes less than 2 seconds.  Neurological:     Mental Status: He is alert and oriented to person, place, and time.     Cranial Nerves: No cranial nerve deficit.     Sensory: No sensory deficit.     Motor: No weakness.  Psychiatric:        Mood and Affect: Mood normal.     ED Results / Procedures / Treatments   Labs (all labs ordered are listed, but only abnormal results are displayed) Labs Reviewed  COMPREHENSIVE METABOLIC PANEL - Abnormal; Notable for the following components:      Result Value   Sodium 124 (*)    Chloride 87 (*)    Glucose, Bld 148 (*)    Total Protein 6.4 (*)    GFR, Estimated 59 (*)    All other components within normal limits  CBC - Abnormal; Notable for the following components:   RBC 3.78 (*)    Hemoglobin 11.4 (*)    HCT 34.5 (*)    All other components within normal limits  OSMOLALITY  OSMOLALITY, URINE  NA AND K (SODIUM & POTASSIUM), RAND UR  CBC  BASIC METABOLIC PANEL    EKG None  Radiology No results found.    Medications Ordered in ED Medications  dorzolamide-timolol (COSOPT) 2-0.5 % ophthalmic solution 1 drop (has no administration in time range)  atorvastatin (LIPITOR) tablet 10 mg (has no administration in time range)  metFORMIN (GLUCOPHAGE) tablet 1,000 mg (has no administration in time range)  finasteride (PROSCAR) tablet 5 mg (has no administration in time range)  prednisoLONE acetate (PRED FORTE) 1 % ophthalmic suspension 1 drop (has no administration in time range)  moxifloxacin (VIGAMOX) 0.5 % ophthalmic solution 1 drop (has no administration in time range)    ED Course/ Medical Decision Making/ A&P                                Medical Decision Making Problems Addressed: Hyponatremia: complicated acute illness or injury  Amount and/or Complexity of Data Reviewed Independent Historian: caregiver    Details: family External Data Reviewed: labs and notes. Labs: ordered.  Risk Decision regarding  hospitalization.   Patient is a 89yM w/ PMHx of Type 2 diabetes, CKD 3a, BPH, HLD, cataract, presenting today for concerns of generalized weakness as well as hyponatremia.  On exam, patient is alert and oriented.  He is in regular rate and rhythm.  Lungs clear to auscultation.  No focal neurologic findings.  Given his labs prior to arrival, concern for hyponatremia, etiology unclear.  He does  not appear acutely volume overloaded.  Will also evaluate for acute kidney injury.  He is alert and oriented without any evidence of obvious altered mental status.  Will also evaluate for further electrolyte derangements.  Lab work reveals sodium of 124, chloride of 87, and hemoglobin of 11.4.  For the labs including osmolality, urine osmolality, and urine electrolytes are ordered and currently pending.  Discussed case with hospitalist regarding admission for hyponatremia with generalized weakness.  They are in agreement with plan for admission.  He is hemodynamically stable and appropriate for transfer to the floor.   Final Clinical Impression(s) / ED Diagnoses Final diagnoses:  Hyponatremia    Rx / DC Orders ED Discharge Orders     None         Rhys Martini, DO 10/05/22 2348    Pricilla Loveless, MD 10/06/22 1504

## 2022-10-05 NOTE — H&P (Addendum)
History and Physical    PatientMarland Colon Paul Colon WUJ:[id number] DOB: 26-Dec-1929 DOA: 10/05/2022 DOS: the patient was seen and examined on 10/06/2022 PCP: [facility]  Patient coming from: Home  Chief Complaint:  Chief Complaint  Patient presents with   Abnormal Labs   HPI: Paul Colon is a 89 y.o. male with medical history significant of Type 2 diabetes, CKD 3a, BPH, HLD, cataract who presents with weakness and hyponatremia on outpatient lab.   Daughter who is his caretaker provides history at bedside as pt is hard of hearing. For the past week he has been weak and almost fell in the shower earlier this week. Denies any dizziness or confusion. Denies any nausea, vomiting or diarrhea. Pt is very independent, eats healthy, lift weights and family pushes him to drink 3-4 16oz water daily.  He presented to [facility] earlier today and had lab work returning with low sodium of 124. Also had documented negative CT head. Full radiology read-out of CT head was not available for review. He was then advised to present to ED.   Pt has hx of hyponatremia in 2023 due to increase fluid intake and sodium improved at that time with fluid restriction.   In the ED, he was afebrile, normotensive on room air.  Repeat Na of 124 , K of 4.2, glucose of 148, creatinine of 1.16   Urine sodium studies are pending. Hospitalist consulted for admission.   Review of Systems: As mentioned in the history of present illness. All other systems reviewed and are negative. Past Medical History:  Diagnosis Date   Diabetes mellitus without complication (HCC)    Kidney disease    Past Surgical History:  Procedure Laterality Date   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   Procedure: TOTAL HIP ARTHROPLASTY ANTERIOR APPROACH;  Surgeon: Durene Romans, MD;  [facility]: [facility];  Service: Orthopedics;  Laterality: Left;   Social History:  reports that he has never smoked. He has never used smokeless tobacco. He  reports that he does not drink alcohol and does not use drugs.  Allergies  Allergen Reactions   Alogliptin Other (See Comments)    Unknown reaction    Penicillin G Rash    History reviewed. No pertinent family history.  Prior to Admission medications   Medication Sig Start Date End Date Taking? Authorizing Provider  atorvastatin (LIPITOR) 20 MG tablet Take 1 tablet (20 mg total) by mouth every evening. 01/25/22   Joseph Art, DO  bisacodyl (DULCOLAX) 10 MG suppository Place 1 suppository (10 mg total) rectally daily as needed for moderate constipation. 01/25/22   Joseph Art, DO  docusate sodium (COLACE) 100 MG capsule Take 1 capsule (100 mg total) by mouth 2 (two) times daily. 01/25/22   Joseph Art, DO  dorzolamide-timolol (COSOPT) 22.3-6.8 MG/ML ophthalmic solution Place 1 drop into both eyes 2 (two) times daily. For glaucoma    [provider]  finasteride (PROSCAR) 5 MG tablet Take 5 mg by mouth daily. For enlarged prostate    [provider]  metFORMIN (GLUCOPHAGE) 500 MG tablet Take 1 tablet (500 mg total) by mouth 2 (two) times daily with a meal. Patient taking differently: Take 500 mg by mouth 2 (two) times daily with a meal. For diabetes -- recheck renal failure every 3 months 04/23/21   Margarita Grizzle, MD  Multiple Vitamins-Minerals (ONE-A-DAY MENS 50+ PO) Take 1 tablet by mouth in the morning.    [provider]  polyethylene glycol (MIRALAX / GLYCOLAX)  17 g packet Take 17 g by mouth 2 (two) times daily. 01/25/22   Joseph Art, DO  Saw Palmetto, Serenoa repens, (SAW PALMETTO PO) Take 2 capsules by mouth in the morning.    [provider]  traMADol (ULTRAM) 50 MG tablet Take 1 tablet (50 mg total) by mouth every 6 (six) hours as needed for moderate pain. 01/25/22   Joseph Art, DO    Physical Exam: Vitals:   10/05/22 2152 10/05/22 2245 10/05/22 2330 10/05/22 2352  BP: (!) 148/65 (!) 142/69 (!) 153/65 (!) 164/73  Pulse: 71  72 69 75  Resp: 11 18 16 18   Temp:  98.2 F (36.8 C)  98.1 F (36.7 C)  TempSrc:  Oral    SpO2: 100% 98% 98% 99%   Constitutional: NAD, calm, comfortable, elderly male laying asleep in bed Eyes:  lids and conjunctivae normal ENMT: Mucous membranes are moist.  Has hearing aids. Neck: normal, supple,  Respiratory: clear to auscultation bilaterally, no wheezing, no crackles. Normal respiratory effort. No accessory muscle use.  Cardiovascular: Regular rate and rhythm, no murmurs / rubs / gallops. No extremity edema.  Abdomen: Soft, nontender nondistended. Bowel sounds positive.  Musculoskeletal: no clubbing / cyanosis. No joint deformity upper and lower extremities. Normal muscle tone.  Skin: no rashes, lesions, ulcers. No induration Neurologic: CN 2-12 grossly intact.  Strength 5/5 in all 4.  Equal bilateral handgrip. Psychiatric: Normal judgment and insight. Alert and oriented x 3. Normal mood.   Data Reviewed:  See HPI  Assessment and Plan: * Hyponatremia Acute on chronic hyponatremia -Na of 124 on presentation with previous average sodium around 132  -pt euvolemic on exam, not on any thiazide. Family watches his fluid consumption closely and has him drink several bottles of water daily due to his CKD. He was seen last year with the same presentation and sodium improved with fluid restriction.  -Urine osmolality of 357, urine sodium of 49.  I believe that patient is consuming too much fluid at home.  Will need to fluid restrict to 1200 ml. -Discussed regarding fluid restriction extensively in the ER with daughter and a male family member.  Shortly after admission daughter was concerned that patient was not on IV fluid and felt we were not offering him treatment here.  I again discussed extensively with her in person regarding the results of his urine study and that it warrants fluid restriction. Discussed that additional IV fluids could be harmful and further decrease his sodium. Also  reviewed with her lab results from previous hyponatremia admission where he had similar urine study results and was also managed with fluid restriction. Daughter understood the need for fluid restriction at the end of our discussion and had all questions answered. -will also test for TSH and a.m. cortisol level although low suspicion of index for these to be abnormal. -Will follow repeat sodium in the morning   Uncontrolled type 2 diabetes mellitus with hyperglycemia, without long-term current use of insulin (HCC) -A1C last year of 7.3  -continue metformin. Daughter refuses for him to be on sliding scale insulin and will be giving him metformin here.   BPH (benign prostatic hyperplasia) -continue Finasteride  Hyperlipidemia Continue atorvastatin  History of cataract surgery -continue ophthalmic solution    HTN (hypertension) Controlled. Not on home antihypertensives.  Chronic kidney disease, stage 3a (HCC) -creatinine is stable at 1.16 which is likely his baseline   Daughter is adamant that she will be administrating his home medication here  rather than having them be dispensed by our inpatient pharmacy.  I discussed that his medication will need to be verified by pharmacy and for safety we usually do not allow patients to administer their own medications unless it is nonformulary. Daughter does not want to pay for additional medication here and wants to give the ones she has already purchased. I discussed with inpatient pharmacy and per hospital regulations patients are not permitted to administer their home medications. Pharmacy will send representative to speak with family regarding the need to only take medication dispense in-house.     Advance Care Planning:   Code Status: Full Code   Consults: none  Family Communication: discussed with daughter at bedside  Severity of Illness: The appropriate patient status for this patient is OBSERVATION. Observation status is judged to be  reasonable and necessary in order to provide the required intensity of service to ensure the patient's safety. The patient's presenting symptoms, physical exam findings, and initial radiographic and laboratory data in the context of their medical condition is felt to place them at decreased risk for further clinical deterioration. Furthermore, it is anticipated that the patient will be medically stable for discharge from the hospital within 2 midnights of admission.   Author: Anselm Jungling, DO 10/06/2022 2:07 AM  For on call review [email or web address].

## 2022-10-05 NOTE — Assessment & Plan Note (Addendum)
Acute on chronic hyponatremia -Na of 124 on presentation with previous average sodium around 132  -pt euvolemic on exam, not on any thiazide. Family watches his fluid consumption closely and has him drink several bottles of water daily due to his CKD. He was seen last year with the same presentation and sodium improved with fluid restriction.  -Urine osmolality of 357, urine sodium of 49.  I believe that patient is consuming too much fluid at home.  Will need to fluid restrict to 1200 ml. -Discussed regarding fluid restriction extensively in the ER with daughter and a male family member.  Shortly after admission daughter was concerned that patient was not on IV fluid and felt we were not offering him treatment here.  I again discussed extensively with her in person regarding the results of his urine study and that it warrants fluid restriction. Discussed that additional IV fluids could be harmful and further decrease his sodium. Also reviewed with her lab results from previous hyponatremia admission where he had similar urine study results and was also managed with fluid restriction. Daughter understood the need for fluid restriction at the end of our discussion and had all questions answered. -will also test for TSH and a.m. cortisol level although low suspicion of index for these to be abnormal. -Will follow repeat sodium in the morning

## 2022-10-05 NOTE — Assessment & Plan Note (Signed)
-  continue Finasteride

## 2022-10-05 NOTE — Assessment & Plan Note (Addendum)
-  continue ophthalmic solution

## 2022-10-05 NOTE — Assessment & Plan Note (Signed)
Continue atorvastatin

## 2022-10-05 NOTE — Assessment & Plan Note (Signed)
Controlled. Not on home antihypertensives.

## 2022-10-06 ENCOUNTER — Observation Stay (HOSPITAL_COMMUNITY): Payer: No Typology Code available for payment source

## 2022-10-06 ENCOUNTER — Encounter (HOSPITAL_COMMUNITY): Payer: Self-pay | Admitting: Family Medicine

## 2022-10-06 DIAGNOSIS — E1122 Type 2 diabetes mellitus with diabetic chronic kidney disease: Secondary | ICD-10-CM | POA: Diagnosis present

## 2022-10-06 DIAGNOSIS — H9190 Unspecified hearing loss, unspecified ear: Secondary | ICD-10-CM | POA: Diagnosis present

## 2022-10-06 DIAGNOSIS — N400 Enlarged prostate without lower urinary tract symptoms: Secondary | ICD-10-CM | POA: Diagnosis present

## 2022-10-06 DIAGNOSIS — E1165 Type 2 diabetes mellitus with hyperglycemia: Secondary | ICD-10-CM | POA: Diagnosis present

## 2022-10-06 DIAGNOSIS — N1831 Chronic kidney disease, stage 3a: Secondary | ICD-10-CM | POA: Diagnosis present

## 2022-10-06 DIAGNOSIS — Z96642 Presence of left artificial hip joint: Secondary | ICD-10-CM | POA: Diagnosis present

## 2022-10-06 DIAGNOSIS — I129 Hypertensive chronic kidney disease with stage 1 through stage 4 chronic kidney disease, or unspecified chronic kidney disease: Secondary | ICD-10-CM | POA: Diagnosis present

## 2022-10-06 DIAGNOSIS — Z9849 Cataract extraction status, unspecified eye: Secondary | ICD-10-CM | POA: Diagnosis not present

## 2022-10-06 DIAGNOSIS — Z880 Allergy status to penicillin: Secondary | ICD-10-CM | POA: Diagnosis not present

## 2022-10-06 DIAGNOSIS — E871 Hypo-osmolality and hyponatremia: Secondary | ICD-10-CM | POA: Diagnosis present

## 2022-10-06 DIAGNOSIS — H409 Unspecified glaucoma: Secondary | ICD-10-CM | POA: Diagnosis present

## 2022-10-06 DIAGNOSIS — Z888 Allergy status to other drugs, medicaments and biological substances status: Secondary | ICD-10-CM | POA: Diagnosis not present

## 2022-10-06 DIAGNOSIS — Z79899 Other long term (current) drug therapy: Secondary | ICD-10-CM | POA: Diagnosis not present

## 2022-10-06 DIAGNOSIS — E1136 Type 2 diabetes mellitus with diabetic cataract: Secondary | ICD-10-CM | POA: Diagnosis present

## 2022-10-06 DIAGNOSIS — E785 Hyperlipidemia, unspecified: Secondary | ICD-10-CM | POA: Diagnosis present

## 2022-10-06 DIAGNOSIS — Z7984 Long term (current) use of oral hypoglycemic drugs: Secondary | ICD-10-CM | POA: Diagnosis not present

## 2022-10-06 LAB — BASIC METABOLIC PANEL
Anion gap: 14 (ref 5–15)
Anion gap: 11 (ref 5–15)
BUN: 23 mg/dL (ref 8–23)
BUN: 22 mg/dL (ref 8–23)
CO2: 19 mmol/L — ABNORMAL LOW (ref 22–32)
CO2: 25 mmol/L (ref 22–32)
Calcium: 8.2 mg/dL — ABNORMAL LOW (ref 8.9–10.3)
Calcium: 8.7 mg/dL — ABNORMAL LOW (ref 8.9–10.3)
Chloride: 90 mmol/L — ABNORMAL LOW (ref 98–111)
Chloride: 87 mmol/L — ABNORMAL LOW (ref 98–111)
Creatinine, Ser: 1.03 mg/dL (ref 0.61–1.24)
Creatinine, Ser: 1.16 mg/dL (ref 0.61–1.24)
GFR, Estimated: >60 mL/min (ref >60)
GFR, Estimated: 59 mL/min — ABNORMAL LOW (ref >60)
Glucose, Bld: 159 mg/dL — ABNORMAL HIGH (ref 70–99)
Glucose, Bld: 241 mg/dL — ABNORMAL HIGH (ref 70–99)
Potassium: 4.2 mmol/L (ref 3.5–5.1)
Potassium: 4 mmol/L (ref 3.5–5.1)
Sodium: 123 mmol/L — ABNORMAL LOW (ref 135–145)
Sodium: 123 mmol/L — ABNORMAL LOW (ref 135–145)

## 2022-10-06 LAB — CBC
HCT: 31.8 % — ABNORMAL LOW (ref 39.0–52.0)
Hemoglobin: 11.1 g/dL — ABNORMAL LOW (ref 13.0–17.0)
MCH: 31.7 pg (ref 26.0–34.0)
MCHC: 34.9 g/dL (ref 30.0–36.0)
MCV: 90.9 fL (ref 80.0–100.0)
Platelets: 285 10*3/uL (ref 150–400)
RBC: 3.5 MIL/uL — ABNORMAL LOW (ref 4.22–5.81)
RDW: 12.2 % (ref 11.5–15.5)
WBC: 7.1 10*3/uL (ref 4.0–10.5)
nRBC: 0 % (ref 0.0–0.2)

## 2022-10-06 LAB — TSH: TSH: 2.32 u[IU]/mL (ref 0.350–4.500)

## 2022-10-06 LAB — SODIUM: Sodium: 129 mmol/L — ABNORMAL LOW (ref 135–145)

## 2022-10-06 LAB — OSMOLALITY: Osmolality: 264 mOsm/kg — ABNORMAL LOW (ref 275–295)

## 2022-10-06 LAB — OSMOLALITY, URINE: Osmolality, Ur: 357 mosm/kg (ref 300–900)

## 2022-10-06 LAB — CORTISOL-AM, BLOOD: Cortisol - AM: 5 ug/dL — ABNORMAL LOW (ref 6.7–22.6)

## 2022-10-06 MED ORDER — PREDNISOLONE ACETATE 1 % OP SUSP
1.0000 [drp] | Freq: Four times a day (QID) | OPHTHALMIC | Status: DC
  Administered 2022-10-07: 1 [drp] via OPHTHALMIC
  Filled 2022-10-06: qty 5

## 2022-10-06 MED ORDER — TOLVAPTAN 15 MG PO TABS
15.0000 mg | ORAL_TABLET | Freq: Once | ORAL | Status: AC
  Administered 2022-10-06: 15 mg via ORAL
  Filled 2022-10-06: qty 1

## 2022-10-06 MED ORDER — COSYNTROPIN 0.25 MG IJ SOLR
0.2500 mg | Freq: Once | INTRAMUSCULAR | Status: AC
  Administered 2022-10-07: 0.25 mg via INTRAVENOUS
  Filled 2022-10-06: qty 0.25

## 2022-10-06 NOTE — Progress Notes (Signed)
PROGRESS NOTE    Paul Colon  ZOX:[id number] DOB: [birth year] DOA: 10/05/2022 PCP: [facility]    Brief Narrative:   Paul Colon is a 89 y.o. male with past medical history of type 2 diabetes,  CKD 3a, BPH, hyperlipidemia, cataract presented to hospital with with weakness.  Had outpatient labs done which showed hyponatremia.  Patient was then sent to the hospital.  Patient is hard of hearing and daughter is caretaker for the patient.  No mention of confusion dizziness lightheadedness.   Pt is very independent, eats healthy, lift weights and family pushes him to drink 3-4 16oz water daily.  Patient presented to  [facility] and had lab work returning with low sodium of 124. Also had documented negative CT head. Pt has hx of hyponatremia in 2023 due to increase fluid intake and sodium improved at that time with fluid restriction. In the ED, he was afebrile, normotensive on room air.  Repeat Na of 124 , K of 4.2, glucose of 148, creatinine of 1.16.  Patient was then admitted hospital for further evaluation and treatment.  Assessment and Plan:  Acute on chronic hyponatremia -Na of 124 on presentation with previous average sodium around 132  Euvolemic but has been drinking several bottles of water. Urine osmolality of 357, serum osmolality low, urine sodium of 49.  Currently on fluid restriction 1200 mL/day.  Patient's daughter at bedside requesting IV fluids.  Explained that we will be consulting with nephrology for proper assessment and management.  TSH of 2.3.  A.m. cortisol at 5 and low.  Will get cosyntropin test in AM.  Spoke with Dr. Arlean Hopping nephrology for consultation.   Uncontrolled type 2 diabetes mellitus with hyperglycemia, without long-term current use of insulin (HCC) Hemoglobin A1c last year of 7.3.  On metformin.  Family refusing sliding scale insulin here so on metformin.  BPH (benign prostatic hyperplasia) -continue Finasteride   Hyperlipidemia Continue  atorvastatin   History of cataract surgery -continue Cosopt and moxifloxacin, prednisone    HTN (hypertension) Slightly elevated at this time not on medications at home.  Will monitor if might need medication.   Chronic kidney disease, stage 3a  -creatinine is stable at 1.0 and at baseline.     DVT prophylaxis: SCDs Start: 10/05/22 2250   Code Status:     Code Status: Full Code  Disposition: Likely home with home health.  At baseline, patient is independent at baseline.  Status is: Observation  The patient will require care spanning > 2 midnights and should be moved to inpatient because: Hyponatremia, generalized weakness   Family Communication: Spoke with the patient's daughter at bedside at length about the treatment plan and consultation.  Consultants:  Nephrology  Procedures:  None  Antimicrobials:  None  Anti-infectives (From admission, onward)    None       Subjective: Today, patient was seen and examined at bedside.  Very hard of hearing.  Wanting to go to the bathroom.  Denies any dizziness lightheadedness.  Patient daughter at bedside  Objective: Vitals:   10/05/22 2330 10/05/22 2352 10/06/22 0535 10/06/22 0923  BP: (!) 153/65 (!) 164/73 (!) 162/64 (!) 172/67  Pulse: 69 75 65 66  Resp: 16 18 19 17   Temp:  98.1 F (36.7 C) (!) 97.5 F (36.4 C) 97.6 F (36.4 C)  TempSrc:   Oral   SpO2: 98% 99% 97% 97%   No intake or output data in the 24 hours ending 10/06/22 1126 There were no vitals filed  for this visit.  Physical Examination: There is no height or weight on file to calculate BMI.  General:  Average built, not in obvious distress elderly male, hard of hearing. HENT:   No scleral pallor or icterus noted. Oral mucosa is moist.  Chest:    Diminished breath sounds bilaterally. No crackles or wheezes.  CVS: S1 &S2 heard. No murmur.  Regular rate and rhythm. Abdomen: Soft, nontender, nondistended.  Bowel sounds are heard.   Extremities: No  cyanosis, clubbing or edema.  Peripheral pulses are palpable. Psych: Alert, awake and Communicative,  CNS:  No cranial nerve deficits.  Moves all extremities. Skin: Warm and dry.  No rashes noted.  Data Reviewed:   CBC: Recent Labs  Lab 10/05/22 1833 10/06/22 0430  WBC 7.3 7.1  HGB 11.4* 11.1*  HCT 34.5* 31.8*  MCV 91.3 90.9  PLT 302 285    Basic Metabolic Panel: Recent Labs  Lab 10/05/22 1833 10/06/22 0430  NA 124* 123*  K 4.2 4.2  CL 87* 90*  CO2 23 19*  GLUCOSE 148* 159*  BUN 19 23  CREATININE 1.16 1.03  CALCIUM 8.9 8.2*    Liver Function Tests: Recent Labs  Lab 10/05/22 1833  AST 16  ALT 12  ALKPHOS 80  BILITOT 0.4  PROT 6.4*  ALBUMIN 3.7     Radiology Studies: No results found.    LOS: 0 days   Joycelyn Das, MD [facility] Available via Epic secure chat 7am-7pm After these hours, please refer to coverage provider listed on [email or web address] 10/06/2022, 11:26 AM

## 2022-10-06 NOTE — Consult Note (Signed)
Renal Service Consult Note [facility]  Paul Colon 10/06/2022 Maree Krabbe, MD Requesting Physician: Dr. Tyson Babinski  Reason for Consult: Hyponatremia HPI: The patient is a 89 y.o. year-old w/ PMH as below who presented to ED sent by his doctor for bloodwork showing low Na+. Hx BPH, HL, DM2. Pt c/o worsening weakness for about 2 wks. He had a cataract procedure planned but was too weak so went to urgent care and had labs done, Na+ was around 124. Head CT here was negative. Family said he had low sodium one time in the past. He is usually healthy and exercises. No CP or SOB. No cough or DOE. No hx CHF, liver or kidney failure. In ED pt was afeb, normal BP, HR and RR.  Na+ was 124, similar to the OP labs. Pt was admitted and urine studies sent off. We are asked to see for hyponatremia.   Pt seen in room. Creat is 1.16 here, eGFR 59 ml/min.  Na 123, BUN 22, creat 1.16.  WBC 7K.  Hb 11.1.  plts 285. He is not on any HTN meds, taking metformin for DM, no insulin.  +proscar and eye gtts. He lifts weights every day. He is on very little medication.  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    ROS - denies CP, no joint pain, no HA, no blurry vision, no rash, no diarrhea, no nausea/ vomiting, no dysuria, no difficulty voiding   Past Medical History  Past Medical History:  Diagnosis Date   Diabetes mellitus without complication (HCC)    Kidney disease    Past Surgical History  Past Surgical History:  Procedure Laterality Date   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   Procedure: TOTAL HIP ARTHROPLASTY ANTERIOR APPROACH;  Surgeon: Durene Romans, MD;  Location: [facility] OR;  Service: Orthopedics;  Laterality: Left;   Family History History reviewed. No pertinent family history. Social History  reports that he has never smoked. He has never used smokeless tobacco. He reports that he does not drink alcohol and does not use drugs. Allergies  Allergies  Allergen  Reactions   Alogliptin Other (See Comments)    Unknown reaction    Penicillin G Rash   Home medications Prior to Admission medications   Medication Sig Start Date End Date Taking? Authorizing Provider  atorvastatin (LIPITOR) 20 MG tablet Take 1 tablet (20 mg total) by mouth every evening. 01/25/22   Joseph Art, DO  bisacodyl (DULCOLAX) 10 MG suppository Place 1 suppository (10 mg total) rectally daily as needed for moderate constipation. 01/25/22   Joseph Art, DO  docusate sodium (COLACE) 100 MG capsule Take 1 capsule (100 mg total) by mouth 2 (two) times daily. 01/25/22   Joseph Art, DO  dorzolamide-timolol (COSOPT) 22.3-6.8 MG/ML ophthalmic solution Place 1 drop into both eyes 2 (two) times daily. For glaucoma    [provider]  finasteride (PROSCAR) 5 MG tablet Take 5 mg by mouth daily. For enlarged prostate    [provider]  metFORMIN (GLUCOPHAGE) 500 MG tablet Take 1 tablet (500 mg total) by mouth 2 (two) times daily with a meal. Patient taking differently: Take 500 mg by mouth 2 (two) times daily with a meal. For diabetes -- recheck renal failure every 3 months 04/23/21   Margarita Grizzle, MD  Multiple Vitamins-Minerals (ONE-A-DAY MENS 50+ PO) Take 1 tablet by mouth in the morning.    [provider]  polyethylene glycol (MIRALAX /  GLYCOLAX) 17 g packet Take 17 g by mouth 2 (two) times daily. 01/25/22   Joseph Art, DO  Saw Palmetto, Serenoa repens, (SAW PALMETTO PO) Take 2 capsules by mouth in the morning.    [provider]  traMADol (ULTRAM) 50 MG tablet Take 1 tablet (50 mg total) by mouth every 6 (six) hours as needed for moderate pain. 01/25/22   Joseph Art, DO     Vitals:   10/05/22 2330 10/05/22 2352 10/06/22 0535 10/06/22 0923  BP: (!) 153/65 (!) 164/73 (!) 162/64 (!) 172/67  Pulse: 69 75 65 66  Resp: 16 18 19 17   Temp:  98.1 F (36.7 C) (!) 97.5 F (36.4 C) 97.6 F (36.4 C)  TempSrc:   Oral   SpO2: 98% 99% 97%  97%   Exam Gen alert, no distress No rash, cyanosis or gangrene Sclera anicteric, throat clear  No jvd or bruits Chest clear bilat to bases, no rales/ wheezing RRR no MRG Abd soft ntnd no mass or ascites +bs GU normal MS no joint effusions or deformity Ext no LE or UE edema, no wounds or ulcers Neuro is alert, HOH, nonfocal    Home meds include - lipitor, dulcolax, colace, proscar, metformin, MVI, tramadol, eye gtts    UNa 49,  UOsm 357   UA pending    Last UA sept 2023 - negative   Cortisol 4:30am today = 5.0 (6- 22)   TSH = 2.320 (0.35- 4.5)    CT head - negative    CXR- pending   Assessment/ Plan: Hyponatremia - Na+ 123 here in an elderly WM, not on many medications (po metformin, statin, proscar), no hx CHF/ cirrhosis/ kidney failure. No HTN. On exam is euvolemic. BP's are a bit high. His am cortisol was low. 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 No pain or nausea, no medications (SSRI, etc.), no edematous condition. I believe he is symptomatic, has been feeling tired recently. He should see an endocrinologist in the OP setting, have d/w his daughter who concurs. Adrenal failure can cause hyponatremia and would be relatively easy to treat. As for acute Rx would recommend po tolvaptan today, then tomorrow we can add salt tabs and fluid restriction.  DM2 - on po medication      Paul Moselle  MD [facility] 10/06/2022, 1:23 PM  Recent Labs  Lab 10/05/22 1833 10/06/22 0430 10/06/22 1210  HGB 11.4* 11.1*  --   ALBUMIN 3.7  --   --   CALCIUM 8.9 8.2* 8.7*  CREATININE 1.16 1.03 1.16  K 4.2 4.2 4.0   Inpatient medications:  atorvastatin  10 mg Oral Daily   dorzolamide-timolol  1 drop Both Eyes BID   finasteride  5 mg Oral Daily   metFORMIN  1,000 mg Oral BID WC   moxifloxacin  1 drop Right Eye QID   prednisoLONE acetate  1 drop Right Eye Q3H

## 2022-10-06 NOTE — Progress Notes (Signed)
Patient's daughter refusing to allow staff to administer medications. Daughter has been administering patient's home medications at bedside. MD Pokhrel, Serita Sheller, and CN Lafonda Mosses all made aware. Patient's daughter educated that this is completely against hospital policy and there could be no exceptions.

## 2022-10-06 NOTE — Progress Notes (Signed)
This Clinical research associate was informed by primary nurse that daughter has patient's home medication at bedside and is administering medications without staff knowledge, this Clinical research associate spoke with daughter, and explained this procedure is against hospital policy and that all medication has to be dispensed by the hospital pharmacy and administered by qualified personnel , daughter educated about potential risks of administering own medication without hospital staff knowledge and daughter verbalized understanding, MD made aware of all of the above  Paul Colon

## 2022-10-06 NOTE — Plan of Care (Signed)

## 2022-10-07 DIAGNOSIS — E1165 Type 2 diabetes mellitus with hyperglycemia: Secondary | ICD-10-CM | POA: Diagnosis not present

## 2022-10-07 DIAGNOSIS — N400 Enlarged prostate without lower urinary tract symptoms: Secondary | ICD-10-CM

## 2022-10-07 DIAGNOSIS — E871 Hypo-osmolality and hyponatremia: Secondary | ICD-10-CM | POA: Diagnosis not present

## 2022-10-07 DIAGNOSIS — E785 Hyperlipidemia, unspecified: Secondary | ICD-10-CM | POA: Diagnosis not present

## 2022-10-07 DIAGNOSIS — Z9841 Cataract extraction status, right eye: Secondary | ICD-10-CM

## 2022-10-07 DIAGNOSIS — N1831 Chronic kidney disease, stage 3a: Secondary | ICD-10-CM

## 2022-10-07 DIAGNOSIS — I10 Essential (primary) hypertension: Secondary | ICD-10-CM

## 2022-10-07 LAB — BASIC METABOLIC PANEL
Anion gap: 8 (ref 5–15)
BUN: 23 mg/dL (ref 8–23)
CO2: 25 mmol/L (ref 22–32)
Calcium: 8.9 mg/dL (ref 8.9–10.3)
Chloride: 98 mmol/L (ref 98–111)
Creatinine, Ser: 1.19 mg/dL (ref 0.61–1.24)
GFR, Estimated: 58 mL/min — ABNORMAL LOW (ref >60)
Glucose, Bld: 163 mg/dL — ABNORMAL HIGH (ref 70–99)
Potassium: 4.2 mmol/L (ref 3.5–5.1)
Sodium: 131 mmol/L — ABNORMAL LOW (ref 135–145)

## 2022-10-07 LAB — ACTH STIMULATION, 3 TIME POINTS
Cortisol, 30 Min: 18 ug/dL
Cortisol, 60 Min: 21.8 ug/dL
Cortisol, Base: 8.4 ug/dL

## 2022-10-07 LAB — CBC
HCT: 33 % — ABNORMAL LOW (ref 39.0–52.0)
Hemoglobin: 11.5 g/dL — ABNORMAL LOW (ref 13.0–17.0)
MCH: 31.1 pg (ref 26.0–34.0)
MCHC: 34.8 g/dL (ref 30.0–36.0)
MCV: 89.2 fL (ref 80.0–100.0)
Platelets: 314 10*3/uL (ref 150–400)
RBC: 3.7 MIL/uL — ABNORMAL LOW (ref 4.22–5.81)
RDW: 12.5 % (ref 11.5–15.5)
WBC: 10.9 10*3/uL — ABNORMAL HIGH (ref 4.0–10.5)
nRBC: 0 % (ref 0.0–0.2)

## 2022-10-07 LAB — MAGNESIUM: Magnesium: 1.7 mg/dL (ref 1.7–2.4)

## 2022-10-07 MED ORDER — SODIUM CHLORIDE 1 G PO TABS
1.0000 g | ORAL_TABLET | Freq: Three times a day (TID) | ORAL | Status: DC
  Administered 2022-10-07: 1 g via ORAL
  Filled 2022-10-07: qty 1

## 2022-10-07 MED ORDER — SODIUM CHLORIDE 1 G PO TABS: 1.0000 g | ORAL_TABLET | Freq: Three times a day (TID) | ORAL | 2 refills | Status: DC

## 2022-10-07 NOTE — Progress Notes (Signed)
PT Cancellation Note  Patient Details Name: Paul Colon MRN: [id number] DOB: May 23, 1929   Cancelled Treatment:    Reason Eval/Treat Not Completed: PT screened, no needs identified, will sign off. Orders received for PT eval. On arrival, pt preparing for d/c home. Son in law in room, declining eval, and states no therapy needs. Per SIL, pt at baseline level for mobility and family able to provide needed level of assist. PT signing off.   Ilda Foil 10/07/2022, 10:53 AM

## 2022-10-07 NOTE — Plan of Care (Signed)

## 2022-10-07 NOTE — Progress Notes (Addendum)
[facility] Progress Note  Subjective: Na up to 131 today  Vitals:   10/06/22 1622 10/06/22 2044 10/07/22 0439 10/07/22 0500  BP: (!) 146/72 (!) 147/80 136/64   Pulse: 71 92 73   Resp: 18 19 18    Temp: 97.9 F (36.6 C) 98.2 F (36.8 C) 98.3 F (36.8 C)   TempSrc:  Oral Oral   SpO2: 99% 99% 94%   Weight:    66.3 kg    Exam: Gen alert, no distress No rash, cyanosis or gangrene Sclera anicteric, throat clear  No jvd or bruits Chest clear bilat to bases RRR no MRG Abd soft ntnd no mass or ascites +bs Ext no LE or UE edema Neuro is alert, HOH, nonfocal      Home meds include - lipitor, dulcolax, colace, proscar, metformin, MVI, tramadol, eye gtts     UNa 49,  UOsm 357   UA pending    Last UA sept 2023 - negative   Cortisol 4:30am today = 5.0 (6- 22)   TSH = 2.320 (0.35- 4.5)    CT head - negative    CXR- pending    Assessment/ Plan: Hyponatremia - Na+ 123 here in an elderly WM, not on many medications (po metformin, statin, proscar), no hx CHF/ cirrhosis/ kidney failure. No HTN. On exam is euvolemic. BP's are a bit high. His am cortisol was low. 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 No pain or nausea, no medications (SSRI, etc.), no edematous condition. Possibly has been symptomatic, has been feeling tired recently. He should see an endocrinologist in the OP setting, have d/w his daughter who concurs. Adrenal failure is a cause of hyponatremia and would be relatively easy to treat. Pt rec'd tolvaptan 15mg  x 1, Na+ up to 131 this am.  [facility] for discharge, see rec's below. I have already discussed these measures w/ the daughter yesterday. Please call w/ any questions.    - ok for discharge - discharge on low dose (1 gm tid) salt tabs   - discharge on 1000 cc fluid restriction   - recommend OP endocrinologist eval for possible adrenal failure   - he should have labs checked in 7-10 days by PCP   - will sign off   DM2 - on po medication      [name]   [facility] 10/07/2022, 6:38 AM  Recent Labs  Lab 10/05/22 1833 10/06/22 0430 10/06/22 1210 10/07/22 0536  HGB 11.4* 11.1*  --  11.5*  ALBUMIN 3.7  --   --   --   CALCIUM 8.9 8.2* 8.7* 8.9  CREATININE 1.16 1.03 1.16 1.19  K 4.2 4.2 4.0 4.2   No results for input(s): "IRON", "TIBC", "FERRITIN" in the last 168 hours. Inpatient medications:  atorvastatin  10 mg Oral Daily   dorzolamide-timolol  1 drop Both Eyes BID   finasteride  5 mg Oral Daily   metFORMIN  1,000 mg Oral BID WC   moxifloxacin  1 drop Right Eye QID   prednisoLONE acetate  1 drop Right Eye QID

## 2022-10-07 NOTE — Progress Notes (Signed)
DISCHARGE NOTE HOME Paul Colon to be discharged Home per MD order. Discussed prescriptions and follow up appointments with the patient. Prescriptions given to patient; medication list explained in detail. Patient verbalized understanding.  Skin clean, dry and intact without evidence of skin break down, no evidence of skin tears noted. IV catheter discontinued intact. Site without signs and symptoms of complications. Dressing and pressure applied. Pt denies pain at the site currently. No complaints noted.  Patient free of lines, drains, and wounds.   An After Visit Summary (AVS) was printed and given to the patient. Patient escorted via wheelchair, and discharged home via private auto. Pick up by daughter Rubye Beach, RN

## 2022-10-07 NOTE — Plan of Care (Signed)

## 2022-10-07 NOTE — Discharge Summary (Signed)
Physician Discharge Summary  Roel Sivers ZOX:[id number] DOB: 1929-12-12 DOA: 10/05/2022  PCP: [facility]  Admit date: 10/05/2022 Discharge date: 10/07/2022  Admitted From: Home  Discharge disposition: Home  Recommendations for Outpatient Follow-Up:   Follow up with your primary care provider in one week.  Check CBC, BMP, magnesium in the next visit  Discharge Diagnosis:   Principal Problem:   Hyponatremia Active Problems:   Uncontrolled type 2 diabetes mellitus with hyperglycemia, without long-term current use of insulin (HCC)   BPH (benign prostatic hyperplasia)   Hyperlipidemia   Chronic kidney disease, stage 3a (HCC)   HTN (hypertension)   History of cataract surgery   Discharge Condition: Improved.  Diet recommendation: Regular.  Wound care: None.  Code status: Full.   History of Present Illness:   Paul Colon is a 89 y.o. male with past medical history of type 2 diabetes,  CKD 3a, BPH, hyperlipidemia, cataract presented to hospital with with weakness.  Had outpatient labs done which showed hyponatremia.  Patient was then sent to the hospital.  Patient is hard of hearing and daughter is caretaker for the patient.  No mention of confusion dizziness lightheadedness.   Pt is very independent, eats healthy, lift weights and family pushes him to drink 3-4 16oz water daily.  Patient presented to  [facility] and had lab work returning with low sodium of 124. Also had documented negative CT head. Pt has hx of hyponatremia in 2023 due to increase fluid intake and sodium improved at that time with fluid restriction. In the ED, he was afebrile, normotensive on room air.  Repeat Na of 124 , K of 4.2, glucose of 148, creatinine of 1.16.  Patient was then admitted hospital for further evaluation and treatment.    Hospital Course:   Following conditions were addressed during hospitalization as listed below,  Acute on chronic hyponatremia -Na of 124 on  presentation with previous average sodium around 132.  Sodium level 131 today after 1 dose of tolvaptan by nephrology yesterday.  Nephrology has recommended fluid restriction 1 L/day.  Had been drinking several bottles of water at home.  On presentation, Urine osmolality of 357, serum osmolality low, urine sodium of 49.   TSH of 2.3.  A.m. cortisol at 5 and low.  Cosyntropin test with appropriate response to ACTH.  Nephrology has signed off at this time.  Recommended salt tablets on discharge.  Spoke with the family about fluid restriction and salt tablets.   Uncontrolled type 2 diabetes mellitus with hyperglycemia, without long-term current use of insulin  Hemoglobin A1c last year of 7.3.  On metformin.  Family refused sliding scale insulin    BPH (benign prostatic hyperplasia) -continue Finasteride   Hyperlipidemia Continue atorvastatin   History of cataract surgery -continue Cosopt and moxifloxacin, prednisone from home.    HTN (hypertension) Slightly elevated at this time of admission but normalized at this time.  Not on medications at home.  Chronic kidney disease, stage 3a  -creatinine is stable at 1.1 and at baseline.  Disposition.  At this time, patient is stable for disposition home with outpatient PCP follow-up.  Spoke with the patient's daughter on the phone.  Medical Consultants:   Nephrology  Procedures:    None Subjective:   Today, patient was seen and examined at bedside.  Feels okay.  Patient's family at bedside  Discharge Exam:   Vitals:   10/07/22 0439 10/07/22 0718  BP: 136/64 127/71  Pulse: 73 76  Resp: 18 18  Temp: 98.3 F (36.8 C) (!) 97.5 F (36.4 C)  SpO2: 94% 95%   Vitals:   10/06/22 2044 10/07/22 0439 10/07/22 0500 10/07/22 0718  BP: (!) 147/80 136/64  127/71  Pulse: 92 73  76  Resp: 19 18  18   Temp: 98.2 F (36.8 C) 98.3 F (36.8 C)  (!) 97.5 F (36.4 C)  TempSrc: Oral Oral  Oral  SpO2: 99% 94%  95%  Weight:   66.3 kg    Body mass  index is 20.97 kg/m.  General: Alert awake, not in obvious distress, elderly male, hard of hearing. HENT: pupils equally reacting to light,  No scleral pallor or icterus noted. Oral mucosa is moist.  Chest:  Clear breath sounds.   No crackles or wheezes.  CVS: S1 &S2 heard. No murmur.  Regular rate and rhythm. Abdomen: Soft, nontender, nondistended.  Bowel sounds are heard.   Extremities: No cyanosis, clubbing or edema.  Peripheral pulses are palpable. Psych: Alert, awake and Communicative, CNS:  No cranial nerve deficits.  Moves all extremities. Skin: Warm and dry.  No rashes noted.  The results of significant diagnostics from this hospitalization (including imaging, microbiology, ancillary and laboratory) are listed below for reference.     Diagnostic Studies:   DG Chest 2 View  Result Date: 10/06/2022 CLINICAL DATA:  Syndrome of inappropriate ADH production. EXAM: CHEST - 2 VIEW COMPARISON:  01/20/2022 FINDINGS: The cardiomediastinal contours are normal. Streaky opacities at the left lung base. Pulmonary vasculature is normal. No consolidation, pleural effusion, or pneumothorax. No evidence of pulmonary mass. No acute osseous abnormalities are seen. IMPRESSION: Streaky opacities at the left lung base, favor atelectasis. No evidence of pulmonary mass. Electronically Signed   By: Narda Rutherford M.D.   On: 10/06/2022 18:06     Labs:   Basic Metabolic Panel: Recent Labs  Lab 10/05/22 1833 10/06/22 0430 10/06/22 1210 10/06/22 2130 10/07/22 0536  NA 124* 123* 123* 129* 131*  K 4.2 4.2 4.0  --  4.2  CL 87* 90* 87*  --  98  CO2 23 19* 25  --  25  GLUCOSE 148* 159* 241*  --  163*  BUN 19 23 22   --  23  CREATININE 1.16 1.03 1.16  --  1.19  CALCIUM 8.9 8.2* 8.7*  --  8.9  MG  --   --   --   --  1.7   GFR Estimated Creatinine Clearance: 37.9 mL/min (by C-G formula based on SCr of 1.19 mg/dL). Liver Function Tests: Recent Labs  Lab 10/05/22 1833  AST 16  ALT 12  ALKPHOS 80   BILITOT 0.4  PROT 6.4*  ALBUMIN 3.7   No results for input(s): "LIPASE", "AMYLASE" in the last 168 hours. No results for input(s): "AMMONIA" in the last 168 hours. Coagulation profile No results for input(s): "INR", "PROTIME" in the last 168 hours.  CBC: Recent Labs  Lab 10/05/22 1833 10/06/22 0430 10/07/22 0536  WBC 7.3 7.1 10.9*  HGB 11.4* 11.1* 11.5*  HCT 34.5* 31.8* 33.0*  MCV 91.3 90.9 89.2  PLT 302 285 314   Cardiac Enzymes: No results for input(s): "CKTOTAL", "CKMB", "CKMBINDEX", "TROPONINI" in the last 168 hours. BNP: Invalid input(s): "POCBNP" CBG: No results for input(s): "GLUCAP" in the last 168 hours. D-Dimer No results for input(s): "DDIMER" in the last 72 hours. Hgb A1c No results for input(s): "HGBA1C" in the last 72 hours. Lipid Profile No results for input(s): "CHOL", "HDL", "LDLCALC", "TRIG", "CHOLHDL", "LDLDIRECT" in the  last 72 hours. Thyroid function studies Recent Labs    10/06/22 0430  TSH 2.320   Anemia work up No results for input(s): "VITAMINB12", "FOLATE", "FERRITIN", "TIBC", "IRON", "RETICCTPCT" in the last 72 hours. Microbiology No results found for this or any previous visit (from the past 240 hour(s)).   Discharge Instructions:   Discharge Instructions     Diet general   Complete by: As directed    Fluid restriction 1 L/day.   Discharge instructions   Complete by: As directed    Follow-up with your primary care provider in 1 week.  Check blood work at that time.  Take salt tablets as described.  Fluid restriction 1 L/day. Seek medical attention for worsening symptoms.   Increase activity slowly   Complete by: As directed       Allergies as of 10/07/2022       Reactions   Alogliptin Other (See Comments)   Unknown reaction    Penicillin G Rash        Medication List     TAKE these medications    aspirin EC 81 MG tablet Take 81 mg by mouth daily. Swallow whole.   atorvastatin 20 MG tablet Commonly known as:  LIPITOR Take 1 tablet (20 mg total) by mouth every evening.   dorzolamide-timolol 2-0.5 % ophthalmic solution Commonly known as: COSOPT Place 1 drop into both eyes 2 (two) times daily. For glaucoma   finasteride 5 MG tablet Commonly known as: PROSCAR Take 5 mg by mouth daily. For enlarged prostate   metFORMIN 500 MG tablet Commonly known as: GLUCOPHAGE Take 1 tablet (500 mg total) by mouth 2 (two) times daily with a meal. What changed: additional instructions   ONE-A-DAY MENS 50+ PO Take 1 tablet by mouth in the morning.   sodium chloride 1 g tablet Take 1 tablet (1 g total) by mouth 3 (three) times daily with meals.        Follow-up Information     [facility] Follow up in 1 week(s).   Contact information: [address] [facility] [place] [address] [phone]                  Time coordinating discharge: 39 minutes  Signed:  Shaquala Broeker  [facility] 10/07/2022, 4:09 PM

## 2022-12-02 ENCOUNTER — Encounter (HOSPITAL_BASED_OUTPATIENT_CLINIC_OR_DEPARTMENT_OTHER): Payer: Self-pay | Admitting: Emergency Medicine

## 2022-12-02 ENCOUNTER — Emergency Department (HOSPITAL_BASED_OUTPATIENT_CLINIC_OR_DEPARTMENT_OTHER)
Admission: EM | Admit: 2022-12-02 | Discharge: 2022-12-02 | Disposition: A | Discharge status: Home / Self Care | Payer: No Typology Code available for payment source | Attending: Emergency Medicine | Admitting: Emergency Medicine

## 2022-12-02 DIAGNOSIS — W182XXA Fall in (into) shower or empty bathtub, initial encounter: Secondary | ICD-10-CM | POA: Diagnosis not present

## 2022-12-02 DIAGNOSIS — S00432A Contusion of left ear, initial encounter: Secondary | ICD-10-CM | POA: Diagnosis not present

## 2022-12-02 DIAGNOSIS — F0390 Unspecified dementia without behavioral disturbance: Secondary | ICD-10-CM | POA: Diagnosis not present

## 2022-12-02 DIAGNOSIS — S0991XA Unspecified injury of ear, initial encounter: Secondary | ICD-10-CM | POA: Diagnosis present

## 2022-12-02 LAB — CBC WITH DIFFERENTIAL/PLATELET
Abs Immature Granulocytes: 0.03 10*3/uL (ref 0.00–0.07)
Basophils Absolute: 0.1 10*3/uL (ref 0.0–0.1)
Basophils Relative: 1 %
Eosinophils Absolute: 0.2 10*3/uL (ref 0.0–0.5)
Eosinophils Relative: 2 %
HCT: 35.6 % — ABNORMAL LOW (ref 39.0–52.0)
Hemoglobin: 11.7 g/dL — ABNORMAL LOW (ref 13.0–17.0)
Immature Granulocytes: 0 %
Lymphocytes Relative: 22 %
Lymphs Abs: 2.1 10*3/uL (ref 0.7–4.0)
MCH: 31 pg (ref 26.0–34.0)
MCHC: 32.9 g/dL (ref 30.0–36.0)
MCV: 94.2 fL (ref 80.0–100.0)
Monocytes Absolute: 0.9 10*3/uL (ref 0.1–1.0)
Monocytes Relative: 9 %
Neutro Abs: 6.3 10*3/uL (ref 1.7–7.7)
Neutrophils Relative %: 66 %
Platelets: 304 10*3/uL (ref 150–400)
RBC: 3.78 MIL/uL — ABNORMAL LOW (ref 4.22–5.81)
RDW: 13.4 % (ref 11.5–15.5)
WBC: 9.6 10*3/uL (ref 4.0–10.5)
nRBC: 0 % (ref 0.0–0.2)

## 2022-12-02 LAB — BASIC METABOLIC PANEL
Anion gap: 10 (ref 5–15)
BUN: 33 mg/dL — ABNORMAL HIGH (ref 8–23)
CO2: 24 mmol/L (ref 22–32)
Calcium: 9 mg/dL (ref 8.9–10.3)
Chloride: 106 mmol/L (ref 98–111)
Creatinine, Ser: 1.19 mg/dL (ref 0.61–1.24)
GFR, Estimated: 58 mL/min — ABNORMAL LOW (ref >60)
Glucose, Bld: 236 mg/dL — ABNORMAL HIGH (ref 70–99)
Potassium: 4.7 mmol/L (ref 3.5–5.1)
Sodium: 140 mmol/L (ref 135–145)

## 2022-12-02 MED ORDER — CIPROFLOXACIN HCL 500 MG PO TABS: 500.0000 mg | ORAL_TABLET | Freq: Two times a day (BID) | ORAL | 0 refills | Status: DC

## 2022-12-02 MED ORDER — LIDOCAINE-EPINEPHRINE-TETRACAINE (LET) TOPICAL GEL
3.0000 mL | Freq: Once | TOPICAL | Status: AC
  Administered 2022-12-02: 3 mL via TOPICAL
  Filled 2022-12-02: qty 3

## 2022-12-02 MED ORDER — CIPROFLOXACIN HCL 500 MG PO TABS
500.0000 mg | ORAL_TABLET | Freq: Once | ORAL | Status: AC
  Administered 2022-12-02: 500 mg via ORAL
  Filled 2022-12-02: qty 1

## 2022-12-02 NOTE — ED Provider Notes (Signed)
[place] EMERGENCY DEPARTMENT AT [facility] Provider Note   CSN: [id number] Arrival date & time: 12/02/22  1820     History Chief Complaint  Patient presents with   Facial Swelling    HPI Paul Colon is a 89 y.o. male presenting for left ear swelling.  Fell in the shower 3 days ago.  Large amount of swelling in the interim.  Today became red and painful for the patient.  He has severe dementia struggles to provide any communication.  He said frequent falls worsening rigidity more fatigue they have also noticed some lipsmacking behavior.  Have not been evaluated by neurologist they report..   Patient's recorded medical, surgical, social, medication list and allergies were reviewed in the Snapshot window as part of the initial history.   Review of Systems   Review of Systems  Constitutional:  Negative for chills and fever.  HENT:  Negative for ear pain and sore throat.   Eyes:  Negative for pain and visual disturbance.  Respiratory:  Negative for cough and shortness of breath.   Cardiovascular:  Negative for chest pain and palpitations.  Gastrointestinal:  Negative for abdominal pain and vomiting.  Genitourinary:  Negative for dysuria and hematuria.  Musculoskeletal:  Negative for arthralgias and back pain.  Skin:  Negative for color change and rash.  Neurological:  Negative for seizures and syncope.  All other systems reviewed and are negative.   Physical Exam Updated Vital Signs BP (!) 191/85   Pulse 71   Temp 97.8 F (36.6 C) (Oral)   Resp 20   SpO2 99%  Physical Exam Vitals and nursing note reviewed.  Constitutional:      General: He is not in acute distress.    Appearance: He is well-developed.  HENT:     Head: Normocephalic and atraumatic.     Ears:     Comments: Large amount left ear swelling.  Erythematous. Eyes:     Conjunctiva/sclera: Conjunctivae normal.  Cardiovascular:     Rate and Rhythm: Normal rate and regular rhythm.  Pulmonary:      Effort: Pulmonary effort is normal. No respiratory distress.  Abdominal:     General: Abdomen is flat. There is no distension.  Musculoskeletal:        General: No swelling or deformity.  Skin:    General: Skin is warm and dry.     Capillary Refill: Capillary refill takes less than 2 seconds.  Neurological:     Mental Status: He is alert and oriented to person, place, and time. Mental status is at baseline.      ED Course/ Medical Decision Making/ A&P    Procedures .Marland KitchenIncision and Drainage  Date/Time: 12/02/2022 10:50 PM  Performed by: Glyn Ade, MD Authorized by: Glyn Ade, MD   Consent:    Consent obtained:  Verbal   Consent given by:  Patient and guardian   Risks, benefits, and alternatives were discussed: yes     Risks discussed:  Bleeding and damage to other organs   Alternatives discussed:  No treatment Location:    Type:  Hematoma   Location:  Head   Head location:  L external ear Procedure type:    Complexity:  Simple Procedure details:    Needle aspiration: yes     Needle size:  18 G   Drainage:  Bloody   Drainage amount:  Copious   Packing materials:  None Post-procedure details:    Procedure completion:  Tolerated    Medications Ordered in  ED Medications  lidocaine-EPINEPHrine-tetracaine (LET) topical gel (3 mLs Topical Given 12/02/22 2157)  ciprofloxacin (CIPRO) tablet 500 mg (500 mg Oral Given 12/02/22 2156)    Medical Decision Making:   89 year old male with a left-sided ear swelling.  Appears to have a auricular hematoma from his fall and complicated by cellulitis/otitis externa.  Will treat with ciprofloxacin p.o. as he will not participate in topical per the family. He has a history of hyponatremia and family was worried that he has been slightly more confused than normal today requested recheck of this lab which will be performed.  Notably he has had more frequent falls rigidity lipsmacking behavior and visual hallucinations  and has not been evaluated by neurology as part of his worsening dementia.  He has cogwheel rigidity on my exam.  I do believe patient should be evaluated in the outpatient setting for Parkinson's disease given these findings.  Will refer to neurology for further up care and management.  Auricular drainage completed as above.  Approximately 15 cc of fluid were extracted.  He still does have some remaining inflammatory product it feels.  I offered full ring block and incision and drainage but family was more worried about pain control within cosmetic outcome. Will continue on ciprofloxacin refer to ENT for definitive management.  No other acute indication for intervention.  Disposition:  I have considered need for hospitalization, however, considering all of the above, I believe this patient is stable for discharge at this time.  Patient/family educated about specific return precautions for given chief complaint and symptoms.  Patient/family educated about follow-up with PCP.     Patient/family expressed understanding of return precautions and need for follow-up. Patient spoken to regarding all imaging and laboratory results and appropriate follow up for these results. All education provided in verbal form with additional information in written form. Time was allowed for answering of patient questions. Patient discharged.    Emergency Department Medication Summary:   Medications  lidocaine-EPINEPHrine-tetracaine (LET) topical gel (3 mLs Topical Given 12/02/22 2157)  ciprofloxacin (CIPRO) tablet 500 mg (500 mg Oral Given 12/02/22 2156)         Clinical Impression:  1. Hematoma of left auricular region      Discharge   Final Clinical Impression(s) / ED Diagnoses Final diagnoses:  Hematoma of left auricular region    Rx / DC Orders ED Discharge Orders          Ordered    ciprofloxacin (CIPRO) 500 MG tablet  Every 12 hours        12/02/22 2303              Glyn Ade,  MD 12/02/22 2304

## 2022-12-02 NOTE — ED Triage Notes (Signed)
Redness and swelling left ear, notice Friday  Now getting wrose

## 2022-12-04 ENCOUNTER — Encounter (INDEPENDENT_AMBULATORY_CARE_PROVIDER_SITE_OTHER): Payer: Self-pay

## 2022-12-04 ENCOUNTER — Ambulatory Visit (INDEPENDENT_AMBULATORY_CARE_PROVIDER_SITE_OTHER): Payer: No Typology Code available for payment source | Admitting: Otolaryngology

## 2022-12-04 VITALS — Ht 71.0 in | Wt 146.0 lb

## 2022-12-04 DIAGNOSIS — S00432A Contusion of left ear, initial encounter: Secondary | ICD-10-CM | POA: Diagnosis not present

## 2022-12-04 NOTE — Progress Notes (Signed)
Otolaryngology Clinic Note Referring provider: Dr. Skipper Cliche HPI:  Paul Colon is a 89 y.o. male seen in urgent follow up for evaluation of auricular hematoma. Patient was seen in ED on 12/02/2022 for auricular hematoma on left, presumed due to fall. It was drained by ED but bolster was not placed. It has since reaccumulated and he presents today. He has dementia but does not appear to be in any pain. There is surrounding erythema and cellulitis with mild tenderness only to palpation. Mild postauricular fullness. 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 No pertinent family history.   Social Connections: Unknown (09/26/2021)   Received from [facility]   Social Network    Social Network: Not on file     Current Outpatient Medications:    aspirin EC 81 MG tablet, Take 81 mg by mouth daily. Swallow whole., Disp: , Rfl:    atorvastatin (LIPITOR) 20 MG tablet, Take 1 tablet (20 mg total) by mouth every evening., Disp: 30 tablet, Rfl: 0   ciprofloxacin (CIPRO) 500 MG tablet, Take 1 tablet (500 mg total) by mouth every 12 (twelve) hours., Disp: 10 tablet, Rfl: 0   dorzolamide-timolol (COSOPT) 22.3-6.8 MG/ML ophthalmic solution, Place 1 drop into both eyes 2 (two) times daily. For glaucoma, Disp: , Rfl:    finasteride (PROSCAR) 5 MG tablet, Take 5 mg by mouth daily. For enlarged prostate, Disp: , Rfl:    metFORMIN (GLUCOPHAGE) 500 MG  tablet, Take 1 tablet (500 mg total) by mouth 2 (two) times daily with a meal. (Patient taking differently: Take 500 mg by mouth 2 (two) times daily with a meal. For diabetes -- recheck renal failure every 3 months), Disp: 30 tablet, Rfl: 0   Multiple Vitamins-Minerals (ONE-A-DAY MENS 50+ PO), Take 1 tablet by mouth in the morning., Disp: , Rfl:    sodium chloride 1 g tablet, Take 1 tablet (1 g total) by mouth 3 (three) times daily with meals., Disp: 90 tablet, Rfl: 2   Physical Exam:   Ht 5\' 11"  (1.803 m)   Wt 146 lb (66.2 kg)   BMI 20.36 kg/m    Salient findings:  CN II-XII intact Left EAC some cerumen Right auricle with large fluctuance - likely hematoma. Appears soft, cellulitis surrounding spares some of the lobule; no postauricular fluctuance; unable to visualize left TM or EAC due to swelling/fluctuance No lesions of oral cavity/oropharynx No obviously palpable neck masses/lymphadenopathy/thyromegaly No respiratory distress or stridor Appropriately responds to questions currently  Procedures:  None  Impression & Plans:  Paul Colon is a 89 y.o. male with reaccumulation of left auricular hematoma with infection  Daughter was with him and I explained to her that this has reaccumulated and I currently do not have the materials to place a bolster. Given reaccumulation and risk of cartilage injury/necrosis due to compromise in blood supply, I explained that it would be best to go to the ED for prompt drainage due to  risk of cartilage injury. Notified Dr. Elijah Birk as well who is ENT on call for help with drainage in ED . They will go. Should they be unable to be seen in the ED or wait times are too long, as a last resort, I can drain the hematoma and place bolster tomorrow (will obtain bolster supplies tonight). However, I would recommend proceeding to ED for drainage tonight which daughter report they will do.   - Will go to ED for drainage; notified Dr. Elijah Birk for assistance with  drainage - please consult ENT  - f/u 10/23 -- he will need recheck anyway to ensure hematoma has not accumulated - This has the potential to cause complications given already existing cellulitis. Will continue PO ciprofloxacin 500mg  BID until resolution of cellulitis ~10-14d   12/04/2022, 2:49 PM

## 2022-12-05 ENCOUNTER — Other Ambulatory Visit: Payer: Self-pay

## 2022-12-05 ENCOUNTER — Emergency Department (HOSPITAL_COMMUNITY): Payer: No Typology Code available for payment source

## 2022-12-05 ENCOUNTER — Inpatient Hospital Stay (HOSPITAL_COMMUNITY)
Admission: EM | Admit: 2022-12-05 | Discharge: 2022-12-11 | DRG: 521 | Disposition: A | Discharge status: Home Health | Payer: No Typology Code available for payment source | Attending: Family Medicine | Admitting: Family Medicine

## 2022-12-05 ENCOUNTER — Encounter (HOSPITAL_COMMUNITY): Payer: Self-pay

## 2022-12-05 ENCOUNTER — Ambulatory Visit (INDEPENDENT_AMBULATORY_CARE_PROVIDER_SITE_OTHER): Payer: No Typology Code available for payment source | Admitting: Otolaryngology

## 2022-12-05 ENCOUNTER — Encounter (INDEPENDENT_AMBULATORY_CARE_PROVIDER_SITE_OTHER): Payer: Self-pay

## 2022-12-05 VITALS — Wt 146.0 lb

## 2022-12-05 DIAGNOSIS — I445 Left posterior fascicular block: Secondary | ICD-10-CM | POA: Diagnosis present

## 2022-12-05 DIAGNOSIS — G9349 Other encephalopathy: Secondary | ICD-10-CM | POA: Diagnosis present

## 2022-12-05 DIAGNOSIS — Z880 Allergy status to penicillin: Secondary | ICD-10-CM | POA: Diagnosis not present

## 2022-12-05 DIAGNOSIS — R4182 Altered mental status, unspecified: Secondary | ICD-10-CM | POA: Diagnosis not present

## 2022-12-05 DIAGNOSIS — Z79899 Other long term (current) drug therapy: Secondary | ICD-10-CM

## 2022-12-05 DIAGNOSIS — F03C0 Unspecified dementia, severe, without behavioral disturbance, psychotic disturbance, mood disturbance, and anxiety: Secondary | ICD-10-CM | POA: Diagnosis present

## 2022-12-05 DIAGNOSIS — I6381 Other cerebral infarction due to occlusion or stenosis of small artery: Secondary | ICD-10-CM | POA: Diagnosis not present

## 2022-12-05 DIAGNOSIS — N1831 Chronic kidney disease, stage 3a: Secondary | ICD-10-CM | POA: Diagnosis present

## 2022-12-05 DIAGNOSIS — E222 Syndrome of inappropriate secretion of antidiuretic hormone: Secondary | ICD-10-CM | POA: Diagnosis present

## 2022-12-05 DIAGNOSIS — S00432A Contusion of left ear, initial encounter: Secondary | ICD-10-CM

## 2022-12-05 DIAGNOSIS — R9389 Abnormal findings on diagnostic imaging of other specified body structures: Secondary | ICD-10-CM | POA: Insufficient documentation

## 2022-12-05 DIAGNOSIS — E785 Hyperlipidemia, unspecified: Secondary | ICD-10-CM | POA: Diagnosis present

## 2022-12-05 DIAGNOSIS — I639 Cerebral infarction, unspecified: Secondary | ICD-10-CM | POA: Diagnosis not present

## 2022-12-05 DIAGNOSIS — Z96642 Presence of left artificial hip joint: Secondary | ICD-10-CM | POA: Diagnosis present

## 2022-12-05 DIAGNOSIS — I6389 Other cerebral infarction: Secondary | ICD-10-CM | POA: Diagnosis not present

## 2022-12-05 DIAGNOSIS — S72001A Fracture of unspecified part of neck of right femur, initial encounter for closed fracture: Secondary | ICD-10-CM

## 2022-12-05 DIAGNOSIS — E1122 Type 2 diabetes mellitus with diabetic chronic kidney disease: Secondary | ICD-10-CM | POA: Diagnosis present

## 2022-12-05 DIAGNOSIS — N400 Enlarged prostate without lower urinary tract symptoms: Secondary | ICD-10-CM | POA: Diagnosis present

## 2022-12-05 DIAGNOSIS — I129 Hypertensive chronic kidney disease with stage 1 through stage 4 chronic kidney disease, or unspecified chronic kidney disease: Secondary | ICD-10-CM | POA: Diagnosis present

## 2022-12-05 DIAGNOSIS — N179 Acute kidney failure, unspecified: Secondary | ICD-10-CM | POA: Diagnosis present

## 2022-12-05 DIAGNOSIS — W010XXA Fall on same level from slipping, tripping and stumbling without subsequent striking against object, initial encounter: Secondary | ICD-10-CM | POA: Diagnosis present

## 2022-12-05 DIAGNOSIS — S72011A Unspecified intracapsular fracture of right femur, initial encounter for closed fracture: Principal | ICD-10-CM | POA: Diagnosis present

## 2022-12-05 DIAGNOSIS — M25551 Pain in right hip: Secondary | ICD-10-CM | POA: Diagnosis present

## 2022-12-05 DIAGNOSIS — S00432D Contusion of left ear, subsequent encounter: Secondary | ICD-10-CM | POA: Diagnosis not present

## 2022-12-05 DIAGNOSIS — R569 Unspecified convulsions: Secondary | ICD-10-CM | POA: Diagnosis not present

## 2022-12-05 DIAGNOSIS — S72009A Fracture of unspecified part of neck of unspecified femur, initial encounter for closed fracture: Secondary | ICD-10-CM | POA: Diagnosis present

## 2022-12-05 DIAGNOSIS — R296 Repeated falls: Principal | ICD-10-CM

## 2022-12-05 DIAGNOSIS — H409 Unspecified glaucoma: Secondary | ICD-10-CM | POA: Diagnosis present

## 2022-12-05 DIAGNOSIS — D649 Anemia, unspecified: Secondary | ICD-10-CM | POA: Diagnosis present

## 2022-12-05 DIAGNOSIS — H6012 Cellulitis of left external ear: Secondary | ICD-10-CM | POA: Diagnosis present

## 2022-12-05 DIAGNOSIS — Z9181 History of falling: Secondary | ICD-10-CM

## 2022-12-05 DIAGNOSIS — E1136 Type 2 diabetes mellitus with diabetic cataract: Secondary | ICD-10-CM | POA: Diagnosis present

## 2022-12-05 DIAGNOSIS — Z7982 Long term (current) use of aspirin: Secondary | ICD-10-CM | POA: Diagnosis not present

## 2022-12-05 DIAGNOSIS — E871 Hypo-osmolality and hyponatremia: Secondary | ICD-10-CM | POA: Diagnosis not present

## 2022-12-05 DIAGNOSIS — E119 Type 2 diabetes mellitus without complications: Secondary | ICD-10-CM

## 2022-12-05 DIAGNOSIS — Z888 Allergy status to other drugs, medicaments and biological substances status: Secondary | ICD-10-CM

## 2022-12-05 DIAGNOSIS — Z7984 Long term (current) use of oral hypoglycemic drugs: Secondary | ICD-10-CM

## 2022-12-05 DIAGNOSIS — N189 Chronic kidney disease, unspecified: Secondary | ICD-10-CM | POA: Diagnosis not present

## 2022-12-05 DIAGNOSIS — Y92009 Unspecified place in unspecified non-institutional (private) residence as the place of occurrence of the external cause: Secondary | ICD-10-CM | POA: Diagnosis not present

## 2022-12-05 DIAGNOSIS — D631 Anemia in chronic kidney disease: Secondary | ICD-10-CM | POA: Diagnosis present

## 2022-12-05 DIAGNOSIS — E1165 Type 2 diabetes mellitus with hyperglycemia: Secondary | ICD-10-CM | POA: Diagnosis present

## 2022-12-05 DIAGNOSIS — R4189 Other symptoms and signs involving cognitive functions and awareness: Secondary | ICD-10-CM | POA: Insufficient documentation

## 2022-12-05 DIAGNOSIS — H61122 Hematoma of pinna, left ear: Secondary | ICD-10-CM | POA: Diagnosis not present

## 2022-12-05 HISTORY — DX: Unspecified dementia, severe, without behavioral disturbance, psychotic disturbance, mood disturbance, and anxiety: F03.C0

## 2022-12-05 LAB — CBC WITH DIFFERENTIAL/PLATELET
Abs Immature Granulocytes: 0.09 10*3/uL — ABNORMAL HIGH (ref 0.00–0.07)
Basophils Absolute: 0.1 10*3/uL (ref 0.0–0.1)
Basophils Relative: 0 %
Eosinophils Absolute: 0.1 10*3/uL (ref 0.0–0.5)
Eosinophils Relative: 1 %
HCT: 34.2 % — ABNORMAL LOW (ref 39.0–52.0)
Hemoglobin: 11.5 g/dL — ABNORMAL LOW (ref 13.0–17.0)
Immature Granulocytes: 1 %
Lymphocytes Relative: 15 %
Lymphs Abs: 1.7 10*3/uL (ref 0.7–4.0)
MCH: 30.4 pg (ref 26.0–34.0)
MCHC: 33.6 g/dL (ref 30.0–36.0)
MCV: 90.5 fL (ref 80.0–100.0)
Monocytes Absolute: 0.9 10*3/uL (ref 0.1–1.0)
Monocytes Relative: 8 %
Neutro Abs: 8.7 10*3/uL — ABNORMAL HIGH (ref 1.7–7.7)
Neutrophils Relative %: 75 %
Platelets: 290 10*3/uL (ref 150–400)
RBC: 3.78 MIL/uL — ABNORMAL LOW (ref 4.22–5.81)
RDW: 12.8 % (ref 11.5–15.5)
WBC: 11.5 10*3/uL — ABNORMAL HIGH (ref 4.0–10.5)
nRBC: 0 % (ref 0.0–0.2)

## 2022-12-05 LAB — BASIC METABOLIC PANEL
Anion gap: 11 (ref 5–15)
BUN: 28 mg/dL — ABNORMAL HIGH (ref 8–23)
CO2: 25 mmol/L (ref 22–32)
Calcium: 8.7 mg/dL — ABNORMAL LOW (ref 8.9–10.3)
Chloride: 92 mmol/L — ABNORMAL LOW (ref 98–111)
Creatinine, Ser: 1.14 mg/dL (ref 0.61–1.24)
GFR, Estimated: >60 mL/min (ref >60)
Glucose, Bld: 192 mg/dL — ABNORMAL HIGH (ref 70–99)
Potassium: 4.3 mmol/L (ref 3.5–5.1)
Sodium: 128 mmol/L — ABNORMAL LOW (ref 135–145)

## 2022-12-05 LAB — GLUCOSE, CAPILLARY: Glucose-Capillary: 283 mg/dL — ABNORMAL HIGH (ref 70–99)

## 2022-12-05 MED ORDER — ACETAMINOPHEN 325 MG PO TABS
650.0000 mg | ORAL_TABLET | Freq: Four times a day (QID) | ORAL | Status: DC | PRN
  Administered 2022-12-05 – 2022-12-06 (×2): 650 mg via ORAL
  Filled 2022-12-05 (×2): qty 2

## 2022-12-05 MED ORDER — ATORVASTATIN CALCIUM 10 MG PO TABS
20.0000 mg | ORAL_TABLET | Freq: Every evening | ORAL | Status: DC
  Administered 2022-12-06 – 2022-12-10 (×4): 20 mg via ORAL
  Filled 2022-12-05: qty 2
  Filled 2022-12-05: qty 1
  Filled 2022-12-05 (×2): qty 2

## 2022-12-05 MED ORDER — CIPROFLOXACIN HCL 500 MG PO TABS
500.0000 mg | ORAL_TABLET | Freq: Two times a day (BID) | ORAL | Status: DC
  Administered 2022-12-06 – 2022-12-10 (×7): 500 mg via ORAL
  Filled 2022-12-05 (×7): qty 1

## 2022-12-05 MED ORDER — CEPHALEXIN 500 MG PO CAPS
500.0000 mg | ORAL_CAPSULE | Freq: Two times a day (BID) | ORAL | Status: DC
  Administered 2022-12-06 – 2022-12-10 (×9): 500 mg via ORAL
  Filled 2022-12-05 (×9): qty 1

## 2022-12-05 MED ORDER — DORZOLAMIDE HCL-TIMOLOL MAL 2-0.5 % OP SOLN
1.0000 [drp] | Freq: Two times a day (BID) | OPHTHALMIC | Status: DC
Start: 2022-12-05 — End: 2022-12-06
  Administered 2022-12-05 – 2022-12-06 (×2): 1 [drp] via OPHTHALMIC
  Filled 2022-12-05: qty 10

## 2022-12-05 MED ORDER — INSULIN ASPART 100 UNIT/ML IJ SOLN: 0.0000 [IU] | Freq: Three times a day (TID) | INTRAMUSCULAR | Status: DC

## 2022-12-05 MED ORDER — CEPHALEXIN 500 MG PO CAPS: 500.0000 mg | ORAL_CAPSULE | Freq: Two times a day (BID) | ORAL | 0 refills | Status: DC

## 2022-12-05 MED ORDER — CIPROFLOXACIN HCL 500 MG PO TABS: 500.0000 mg | ORAL_TABLET | Freq: Two times a day (BID) | ORAL | 0 refills | Status: DC

## 2022-12-05 MED ORDER — MUPIROCIN 2 % EX OINT: 1.0000 | TOPICAL_OINTMENT | Freq: Two times a day (BID) | CUTANEOUS | 0 refills | Status: DC

## 2022-12-05 MED ORDER — FINASTERIDE 5 MG PO TABS
5.0000 mg | ORAL_TABLET | Freq: Every evening | ORAL | Status: DC
  Administered 2022-12-06 – 2022-12-10 (×4): 5 mg via ORAL
  Filled 2022-12-05 (×4): qty 1

## 2022-12-05 MED ORDER — MUPIROCIN 2 % EX OINT
1.0000 | TOPICAL_OINTMENT | Freq: Two times a day (BID) | CUTANEOUS | Status: DC
Start: 2022-12-05 — End: 2022-12-11
  Administered 2022-12-05 – 2022-12-11 (×11): 1 via TOPICAL
  Filled 2022-12-05 (×5): qty 22

## 2022-12-05 NOTE — Assessment & Plan Note (Deleted)
Per daughter, progressing over past several years.  TSH 8/24: 2.3 WNL.  CT head: No ventriculomegaly. - Ordered reversible causes of dementia

## 2022-12-05 NOTE — Hospital Course (Addendum)
Paul Colon is a 89 y.o.male with a history of hyponatremia, HTN, DM2, CKD3a, anemia who was admitted to the [facility] at [facility] for fall resulting in hip fracture. His hospital course is detailed below:  Fall  Started after patient fell off balance trying to get into the car. Fell onto the right side and was partially witnessed by daughter. Hip XR in ED showed right femoral neck fracture. CT head unremarkable for acute bleed. ECHO showed normal EF 55-60% with no regional wall motion abnormalities. PT/OT/SLP consulted inpatient and home health ordered. Walker for DME ordered as well.  Right Hip Fracture  Noted on imaging and required fixation with orthopedics. Tolerated well and PT/OT followed. Pt received Tylenol 650 mg Q6 and did not take any oxycodone as needed. Ordered rolling walker. Discussed with ortho and neuro for aspirin 81 mg BID x 30 days total for DVT ppx.   Cognitive Decline  CT head with chronic changes, chronic ischemic disease. B12 and folate normal. Palliative care declined by daughter. MRI showed acute infarct however low suspicion for cause of confusion per neurology. LDL appropriate, ECHO as above without abnormality. Aspirin 81 mg BID + plavix for 30 days per neurology, then resume plavix daily.   Hyponatremia  Resumed pt's regimen of salt tablets and 1 L fluid restriction with improvement in hyponatremia. Curbsided nephrology and urine studies repeated to ensure similar presentation, pending at discharge but did respond to previous treatments.   Stroke Found on MR MRI brain showed small acute right temporal lobe infarct. Stroke work up initiated and started DAPT therapy with DVT prophylaxis: Aspirin 81 mg BID with Plavix 75 daily for 30 days, then ASA 81 mg daily alone. Pt on Lipitor 20 mg and LDL at goal so no changes made.   Left ear hematoma Required drainage again by ENT. Continued Keflex (500 mg BID until last dose on 10/30) but stopped ciprofloxacin iso  possible risk factor for confusion/AMS and patient had received 5 days of ciprofloxacin. Continue mupirocen BID until bolster removal which ENT was messaged to schedule OP follow up.   Other chronic conditions were medically managed with home medications and formulary alternatives as necessary.   PCP Follow-up Recommendations: ENT bolster removal 11/1, monitor for left ear bleeding while being on DAPT and DVT prophylaxis Consider outpatient Ophthalmologist referral by PCP for missed appt and further follow up  Follow up with neurology: Dr. Everlena Cooper at [facility] in about 4 weeks , Aspirin 81 mg BID + plavix for 30 days, then aspirin daily Follow up with orthopedics in 2 weeks for post op check Follow up hyponatremia workup: urine osm, urine Na, serum osm pending at discharge

## 2022-12-05 NOTE — ED Triage Notes (Signed)
Pt bib ems for a fall. Pt was getting out of the car, lost his balance and fell onto his right hip, no LOC, did not hit his head, no blood thinners. Pt c.o right hip/leg pain upon ambulating. Outward rotation noted to leg.   155/83 P75 Rr18  95% room air.

## 2022-12-05 NOTE — Assessment & Plan Note (Deleted)
-   Continue home Keflex 500 mg twice daily and ciprofloxacin 500 mg daily

## 2022-12-05 NOTE — ED Notes (Signed)
ED TO INPATIENT HANDOFF REPORT  ED Nurse Name and Phone #: Tobi Bastos [phone]  S Name/Age/Gender Paul Colon 89 y.o. male Room/Bed: H015C/H015C  Code Status   Code Status: Full Code  Home/SNF/Other Home Patient oriented to: self, place, and situation Is this baseline? Yes   Triage Complete: Triage complete  Chief Complaint Hip fracture (HCC) [S72.009A]  Triage Note Pt bib ems for a fall. Pt was getting out of the car, lost his balance and fell onto his right hip, no LOC, did not hit his head, no blood thinners. Pt c.o right hip/leg pain upon ambulating. Outward rotation noted to leg.   155/83 P75 Rr18  95% room air.      Allergies Allergies  Allergen Reactions   Alogliptin Other (See Comments)    Unknown reaction    Penicillin G Rash    Level of Care/Admitting Diagnosis ED Disposition     ED Disposition  Admit   Condition  --   Comment  Hospital Area: [facility] [[id number]]  Level of Care: Med-Surg [16]  May admit patient to [facility] or [facility] if equivalent level of care is available:: Yes  Covid Evaluation: Asymptomatic - no recent exposure (last 10 days) testing not required  Diagnosis: Hip fracture [facility]) [[id number]]  Admitting Physician: Alfredo Martinez [[id number]]  Attending Physician: Billey Co [[id number]]  Certification:: I certify this patient will need inpatient services for at least 2 midnights  Expected Medical Readiness: 12/07/2022          B Medical/Surgery History Past Medical History:  Diagnosis Date   Diabetes mellitus without complication (HCC)    Kidney disease    Past Surgical History:  Procedure Laterality Date   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   Procedure: TOTAL HIP ARTHROPLASTY ANTERIOR APPROACH;  Surgeon: Durene Romans, MD;  Location: [facility] OR;  Service: Orthopedics;  Laterality: Left;     A IV Location/Drains/Wounds Patient Lines/Drains/Airways Status     Active Line/Drains/Airways     Name  Placement date Placement time Site Days   Peripheral IV 12/05/22 20 G Left Antecubital 12/05/22  1842  Antecubital  less than 1            Intake/Output Last 24 hours No intake or output data in the 24 hours ending 12/05/22 2040  Labs/Imaging Results for orders placed or performed during the hospital encounter of 12/05/22 (from the past 48 hour(s))  CBC with Differential     Status: Abnormal   Collection Time: 12/05/22  6:35 PM  Result Value Ref Range   WBC 11.5 (H) 4.0 - 10.5 K/uL   RBC 3.78 (L) 4.22 - 5.81 MIL/uL   Hemoglobin 11.5 (L) 13.0 - 17.0 g/dL   HCT 08.6 (L) 57.8 - 46.9 %   MCV 90.5 80.0 - 100.0 fL   MCH 30.4 26.0 - 34.0 pg   MCHC 33.6 30.0 - 36.0 g/dL   RDW 62.9 52.8 - 41.3 %   Platelets 290 150 - 400 K/uL   nRBC 0.0 0.0 - 0.2 %   Neutrophils Relative % 75 %   Neutro Abs 8.7 (H) 1.7 - 7.7 K/uL   Lymphocytes Relative 15 %   Lymphs Abs 1.7 0.7 - 4.0 K/uL   Monocytes Relative 8 %   Monocytes Absolute 0.9 0.1 - 1.0 K/uL   Eosinophils Relative 1 %   Eosinophils Absolute 0.1 0.0 - 0.5 K/uL   Basophils Relative 0 %   Basophils Absolute 0.1 0.0 - 0.1 K/uL  Immature Granulocytes 1 %   Abs Immature Granulocytes 0.09 (H) 0.00 - 0.07 K/uL    Comment: Performed at [facility], [address] [place], [place] [address]  Basic metabolic panel     Status: Abnormal   Collection Time: 12/05/22  6:35 PM  Result Value Ref Range   Sodium 128 (L) 135 - 145 mmol/L   Potassium 4.3 3.5 - 5.1 mmol/L   Chloride 92 (L) 98 - 111 mmol/L   CO2 25 22 - 32 mmol/L   Glucose, Bld 192 (H) 70 - 99 mg/dL    Comment: Glucose reference range applies only to samples taken after fasting for at least 8 hours.   BUN 28 (H) 8 - 23 mg/dL   Creatinine, Ser 0.86 0.61 - 1.24 mg/dL   Calcium 8.7 (L) 8.9 - 10.3 mg/dL   GFR, Estimated >57 >84 mL/min    Comment: (NOTE) Calculated using the CKD-EPI Creatinine Equation (2021)    Anion gap 11 5 - 15    Comment: Performed at [facility], [address], [place], [place] [address]   CT HEAD WO CONTRAST ( )  Result Date: 12/05/2022 CLINICAL DATA:  Head trauma, minor (Age >= 65y) fall Pt bib ems for a fall. Pt was getting out of the car, lost his balance and fell onto his right hip, no LOC, did not hit his head, no blood thinners. EXAM: CT HEAD WITHOUT CONTRAST TECHNIQUE: Contiguous axial images were obtained from the base of the skull through the vertex without intravenous contrast. RADIATION DOSE REDUCTION: This exam was performed according to the departmental dose-optimization program which includes automated exposure control, adjustment of the mA and/or kV according to patient size and/or use of iterative reconstruction technique. COMPARISON:  CT head 08/24/2022 FINDINGS: Brain: Cerebral ventricle sizes are concordant with the degree of cerebral volume loss. Patchy and confluent areas of decreased attenuation are noted throughout the deep and periventricular white matter of the cerebral hemispheres bilaterally, compatible with chronic microvascular ischemic disease. No evidence of large-territorial acute infarction. No parenchymal hemorrhage. No mass lesion. No extra-axial collection. No mass effect or midline shift. No hydrocephalus. Basilar cisterns are patent. Vascular: No hyperdense vessel. Atherosclerotic calcifications are present within the cavernous internal carotid arteries. Skull: No acute fracture or focal lesion. Sinuses/Orbits: Paranasal sinuses and mastoid air cells are clear. Right lens replacement. The orbits are unremarkable. Other: None. Electronically Signed   By: Tish Frederickson M.D.   On: 12/05/2022 19:50   DG Chest 2 View  Result Date: 12/05/2022 CLINICAL DATA:  fall, R hip injury EXAM: CHEST - 2 VIEW COMPARISON:  Chest x-ray 10/06/2022 FINDINGS: The heart and mediastinal contours are unchanged. Atherosclerotic plaque. Vague left base airspace opacity. No pulmonary edema. No pleural effusion. No pneumothorax. No  acute osseous abnormality. IMPRESSION: 1. Vague left base airspace opacity. Finding may represent atelectasis versus developing infection/inflammation. 2.  Aortic Atherosclerosis (ICD10-I70.0). Electronically Signed   By: Tish Frederickson M.D.   On: 12/05/2022 19:48   DG Hip Unilat W or Wo Pelvis 2-3 Views Right  Result Date: 12/05/2022 CLINICAL DATA:  fall, R hip injury EXAM: DG HIP (WITH OR WITHOUT PELVIS) 2-3V RIGHT COMPARISON:  X-ray left hip 01/20/2022 FINDINGS: Total left hip arthroplasty. No radiographic findings to suggest surgical hardware complication. Acute displaced right femoral neck fracture. No dislocation of either hips. No acute displaced fracture or diastasis of the bones of the pelvis. There is no evidence of arthropathy or other focal bone abnormality.  IMPRESSION: Acute displaced right femoral neck fracture. Electronically Signed   By: Tish Frederickson M.D.   On: 12/05/2022 19:46    Pending Labs Unresulted Labs (From admission, onward)     Start     Ordered   12/06/22 0500  Basic metabolic panel  Tomorrow morning,   R        12/05/22 2036   12/06/22 0500  CBC  Tomorrow morning,   R        12/05/22 2036   12/06/22 0500  Vitamin B12  Tomorrow morning,   R        12/05/22 2040   12/06/22 0500  Folate  Tomorrow morning,   R        12/05/22 2040            Vitals/Pain Today's Vitals   12/05/22 1601  BP: (!) 173/91  Pulse: 77  Resp: 18  Temp: 98.8 F (37.1 C)  TempSrc: Oral  SpO2: 96%    Isolation Precautions No active isolations  Medications Medications  acetaminophen (TYLENOL) tablet 650 mg (has no administration in time range)  atorvastatin (LIPITOR) tablet 20 mg (has no administration in time range)  mupirocin ointment (BACTROBAN) 2 % 1 Application (has no administration in time range)  finasteride (PROSCAR) tablet 5 mg (has no administration in time range)  dorzolamide-timolol (COSOPT) 2-0.5 % ophthalmic solution 1 drop (has no administration in time  range)  ciprofloxacin (CIPRO) tablet 500 mg (has no administration in time range)  cephALEXin (KEFLEX) capsule 500 mg (has no administration in time range)    Mobility walks with device     Focused Assessments Cardiac Assessment Handoff:    Lab Results  Component Value Date   CKTOTAL 73 01/20/2022   No results found for: "DDIMER" Does the Patient currently have chest pain? No    R Recommendations: See Admitting Provider Note  Report given to:   Additional Notes: R hip fx, NPO p MN

## 2022-12-05 NOTE — Assessment & Plan Note (Deleted)
128 on admission.  Chronic.

## 2022-12-05 NOTE — Progress Notes (Signed)
Otolaryngology Clinic Note Referring provider: ED HPI:  Paul Colon is a 89 y.o. male with dementia seen in urgent follow up for evaluation of auricular hematoma.  Initial visit 12/04/2022: Patient was seen in ED on 12/02/2022 for auricular hematoma on left, presumed due to fall. It was drained by ED but bolster was not placed. It has since reaccumulated and he presents today. He has dementia but does not appear to be in any pain. There is surrounding erythema and cellulitis with mild tenderness only to palpation. Mild postauricular fullness. He has longstanding HL and wears hearing aids but no other significant otologic history He is on PO cipfloxacin  Pt and daughter were advised to go to the ED because I was unable to place a bolster due to lack of supplies.  However they did not go to the ED and present today  12/05/2022: Daughter reports that she did not take him to the ED due to concern for long wait times.  No significant change in swelling or status changes, no ear drainage, no significant ear pain or discomfort.  He has not had an auricular hematoma prior to this episode and denies prior significant ear problems.  He has not been wearing his hearing aid on the left but he has been wearing his hearing aid on the right  PMHx: dementia, on ASA 74 - denies CAD or Heart problems Denies H&N surgery in past  Independent Review of Additional Tests or Records:  ED notes reviewed Head CT 08/24/2022: mastoids well aerated, ossicles and auricle unremarkable  PMH/Meds/All/SocHx/FamHx/ROS:   Past Medical History:  Diagnosis Date   Diabetes mellitus without complication (HCC)    Kidney disease      Past Surgical History:  Procedure Laterality Date   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   Procedure: TOTAL HIP ARTHROPLASTY ANTERIOR APPROACH;  Surgeon: Durene Romans, MD;  Location: [facility] OR;  Service: Orthopedics;  Laterality: Left;    History reviewed. No pertinent family history.    Social Connections: Unknown (09/26/2021)   Received [facility]   Social Network    Social Network: Not on file     Current Outpatient Medications:    cephALEXin (KEFLEX) 500 MG capsule, Take 1 capsule (500 mg total) by mouth 2 (two) times daily., Disp: 14 capsule, Rfl: 0   mupirocin ointment (BACTROBAN) 2 %, Apply 1 Application topically 2 (two) times daily. Over the yellow gauze twice per day, Disp: 22 g, Rfl: 0   aspirin EC 81 MG tablet, Take 81 mg by mouth daily. Swallow whole., Disp: , Rfl:    atorvastatin (LIPITOR) 20 MG tablet, Take 1 tablet (20 mg total) by mouth every evening., Disp: 30 tablet, Rfl: 0   ciprofloxacin (CIPRO) 500 MG tablet, Take 1 tablet (500 mg total) by mouth every 12 (twelve) hours., Disp: 7 tablet, Rfl: 0   dorzolamide-timolol (COSOPT) 22.3-6.8 MG/ML ophthalmic solution, Place 1 drop into both eyes 2 (two) times daily. For glaucoma, Disp: , Rfl:    finasteride (PROSCAR) 5 MG tablet, Take 5 mg by mouth daily. For enlarged prostate, Disp: , Rfl:    metFORMIN (GLUCOPHAGE) 500 MG tablet, Take 1 tablet (500 mg total) by mouth 2 (two) times daily with a meal. (Patient taking differently: Take 500 mg by mouth 2 (two) times daily with a meal. For diabetes -- recheck renal failure every 3 months), Disp: 30 tablet, Rfl: 0   Multiple Vitamins-Minerals (ONE-A-DAY MENS 50+ PO), Take 1 tablet by mouth in the  morning., Disp: , Rfl:    sodium chloride 1 g tablet, Take 1 tablet (1 g total) by mouth 3 (three) times daily with meals., Disp: 90 tablet, Rfl: 2   Physical Exam:   Wt 146 lb (66.2 kg)   BMI 20.36 kg/m    Salient findings:  CN II-XII intact Left EAC some cerumen Right auricle with large fluctuance see photo below- likely hematoma. Appears soft, cellulitis surrounding spares some of the lobule; no postauricular fluctuance; unable to visualize left TM or EAC due to swelling/fluctuance No lesions of oral cavity/oropharynx No obviously palpable  neck masses/lymphadenopathy/thyromegaly No respiratory distress or stridor Appropriately responds to questions currently  Pre:   Post drainage:   Procedures:  Procedure Note Pre-procedure diagnosis: Left auricular hematoma Post-procedure diagnosis: Same Procedure: Incision and Drainage of Left external ear hematoma (external ear), complicated - CPT 69005; mod 25 Complications: None apparent EBL: 20 mL Date: 12/05/22   Indication: Paul Colon is a 89 y.o.  male with complicated left ear hematoma given reaccumulation, Aspirin use, and age. Given reaccumulation, decision was made for incision and drainage of hematoma following discussion of risks/benefits of procedure. Risks including cosmetic deformity, reaccumulation, need for further procedures were explained. Patient was not able to provide consent due to dementia so consent from daughter was obtained.  The patient was identified as the correct patient and consent was obtained from daughter (see above).  Circumauricular block was performed using 1% lidocaine with 1-100,000 of epinephrine.  A small amount of the same local anesthetic mix was also injected into the posterior part of the auricle order to provide anesthesia during bolster placement.  Surrounding skin was cleansed, draped and prepped with betadine in usual fashion for procedure.  A 1.5 cm incision was made over the antihelix portion of the auricle through skin and dermis. Hemostat was used to dissect into hematoma pocket. There was a copious amount of blood and clot which was evacuated. The wound cavity was irrigated with saline using angiocath. Purulence was not encountered. Any Loculation and clots were broken up and removed with hemostat. Incision was left open to avoid reaccumulation.No fluctuance was noted afterwards and auricle appeared much improved. A bolster was fashioned from xeroform and 3 separate pieces were sutured into place using 2-0 and 3-0 prolene sutures. The skin  was cleansed and mupirocin ointment applied.  Patient tolerated this procedure well and there were no immediately apparent complications.  Electronically signed by: Read Drivers, MD 12/05/2022 8:24 PM   Impression & Plans:  Donnal Fasciano is a 89 y.o. male with reaccumulation of left auricular hematoma with likely cellulitis surrounding.  Left auricular hematoma Left auricular cellulitis This was drained today as patient did not go to the ED yesterday for re-drainage. Bolster was placed. - Continue PO ciprofloxacin 500mg  BID x7d; he lifts weights - advised not to do that for a week given risk of injury - Start Keflex PO 500mg  BID x7d - Mupirocin 2% ointment to auricle BID - Avoid getting bolster wet or putting pressure on the ear - don't wear hearing aid on that side until bolster removed - anticipate 7d - If possible, hold ASA 81 for 7d - f/u in 2 days for recheck  I have personally spent 68 minutes involved in face-to-face and non-face-to-face activities for this patient on the day of the visit.  Professional time spent includes the following activities, in addition to those noted in the documentation: preparing to see the patient (review of outside documentation), performing a  medically appropriate examination and/or evaluation, counseling and educating the patient/family/caregiver, ordering medications, performing procedures (hematoma evacuation), referring and communicating with other healthcare professionals, documenting clinical information in the electronic or other health record, independently interpreting results and communicating results with the patient/family/caregiver (daughter).

## 2022-12-05 NOTE — Assessment & Plan Note (Addendum)
128 on admission.  Chronic hyponatremia around 130 at baseline. - Every 8 BMPs

## 2022-12-05 NOTE — Assessment & Plan Note (Signed)
Evidence of left base airspace opacity on chest x-ray, unknown if atelectasis or dressing infection/inflammation.  No evidence of oxygen desaturation or dyspnea.  Will continue to monitor.  Incentive spirometry and pulmonary toilet postoperatively.

## 2022-12-05 NOTE — Assessment & Plan Note (Addendum)
Hip x-ray: Acute displaced right femoral neck fracture.  Patient in no pain at rest.  No hematoma identified on exam.  Slight scrapes on dorsal right hand -- otherwise no evidence of guarding/upper extremity injury.  Right clavicle intact on CXR. ED provider Dr. Rush Landmark spoke to Dr. Susa Simmonds- [facility] who instructed to continue with inpatient admission and orthopedics will see and likely continue with surgical intervention.  - Orthopedic surgery consulted, appreciate recs - Likely surgical repair of right hip over next 24 to 48 hours - PRN Tylenol 650 every 6 hours for mild pain, will order as needed for moderate pain if indicated -N.p.o. at midnight for surgical fixation - AM CBC, BMP - PT/OT - Hold VTE prophylaxis prior to surgical intervention - Fall and delirium precautions - AM A1c

## 2022-12-05 NOTE — ED Provider Notes (Signed)
[place] EMERGENCY DEPARTMENT AT [facility] Provider Note   CSN: [id number] Arrival date & time: 12/05/22  1548     History  Chief Complaint  Patient presents with   Paul Colon is a 89 y.o. male.  The history is provided by the patient and medical records. The history is limited by the condition of the patient. No language interpreter was used.  Fall This is a recurrent problem. The current episode started 3 to 5 hours ago. The problem occurs rarely. Pertinent negatives include no chest pain, no abdominal pain, no headaches and no shortness of breath. Nothing aggravates the symptoms. Nothing relieves the symptoms. He has tried nothing for the symptoms. The treatment provided no relief.       Home Medications Prior to Admission medications   Medication Sig Start Date End Date Taking? Authorizing Provider  aspirin EC 81 MG tablet Take 81 mg by mouth daily. Swallow whole.    [provider]  atorvastatin (LIPITOR) 20 MG tablet Take 1 tablet (20 mg total) by mouth every evening. 01/25/22   Joseph Art, DO  cephALEXin (KEFLEX) 500 MG capsule Take 1 capsule (500 mg total) by mouth 2 (two) times daily. 12/05/22   Read Drivers, MD  ciprofloxacin (CIPRO) 500 MG tablet Take 1 tablet (500 mg total) by mouth every 12 (twelve) hours. 12/05/22   Read Drivers, MD  dorzolamide-timolol (COSOPT) 22.3-6.8 MG/ML ophthalmic solution Place 1 drop into both eyes 2 (two) times daily. For glaucoma    [provider]  finasteride (PROSCAR) 5 MG tablet Take 5 mg by mouth daily. For enlarged prostate    [provider]  metFORMIN (GLUCOPHAGE) 500 MG tablet Take 1 tablet (500 mg total) by mouth 2 (two) times daily with a meal. Patient taking differently: Take 500 mg by mouth 2 (two) times daily with a meal. For diabetes -- recheck renal failure every 3 months 04/23/21   Margarita Grizzle, MD  Multiple Vitamins-Minerals (ONE-A-DAY MENS 50+ PO) Take 1  tablet by mouth in the morning.    [provider]  mupirocin ointment (BACTROBAN) 2 % Apply 1 Application topically 2 (two) times daily. Over the yellow gauze twice per day 12/05/22   Jovita Kussmaul B, MD  sodium chloride 1 g tablet Take 1 tablet (1 g total) by mouth 3 (three) times daily with meals. 10/07/22   Pokhrel, Rebekah Chesterfield, MD      Allergies    Alogliptin and Penicillin g    Review of Systems   Review of Systems  Constitutional:  Negative for chills, diaphoresis, fatigue and fever.  HENT:  Negative for congestion.   Respiratory:  Negative for cough, chest tightness, shortness of breath and wheezing.   Cardiovascular:  Negative for chest pain, palpitations and leg swelling.  Gastrointestinal:  Negative for abdominal pain, constipation, diarrhea, nausea and vomiting.  Genitourinary:  Negative for flank pain and frequency.  Musculoskeletal:  Negative for back pain, neck pain and neck stiffness.  Neurological:  Negative for light-headedness and headaches.  Psychiatric/Behavioral:  Negative for agitation and confusion.   All other systems reviewed and are negative.   Physical Exam Updated Vital Signs BP (!) 173/91 (BP Location: Right Arm)   Pulse 77   Temp 98.8 F (37.1 C) (Oral)   Resp 18   SpO2 96%  Physical Exam Constitutional:      General: He is not in acute distress.    Appearance: He is well-developed. He is  not ill-appearing, toxic-appearing or diaphoretic.  HENT:     Head:     Comments: Left ear wrapped and bandaged from previous drainage and infection.  No tenderness reported or pain.    Right Ear: External ear normal.     Left Ear: External ear normal.     Nose: Nose normal.     Mouth/Throat:     Mouth: Mucous membranes are moist.     Pharynx: No oropharyngeal exudate.  Eyes:     Conjunctiva/sclera: Conjunctivae normal.     Pupils: Pupils are equal, round, and reactive to light.  Cardiovascular:     Pulses: Normal pulses.  Pulmonary:     Effort: No  respiratory distress.     Breath sounds: No stridor.  Abdominal:     General: Abdomen is flat.     Palpations: Abdomen is soft.     Tenderness: There is no abdominal tenderness. There is no guarding or rebound.  Musculoskeletal:        General: Tenderness present.     Cervical back: Normal range of motion and neck supple. No tenderness.     Comments: Pain with right hip manipulation and possible shortening and external rotation.  Skin:    General: Skin is warm.     Findings: No erythema or rash.  Neurological:     Mental Status: He is alert.     Sensory: No sensory deficit.     Motor: No weakness or abnormal muscle tone.     ED Results / Procedures / Treatments   Labs (all labs ordered are listed, but only abnormal results are displayed) Labs Reviewed  CBC WITH DIFFERENTIAL/PLATELET - Abnormal; Notable for the following components:      Result Value   WBC 11.5 (*)    RBC 3.78 (*)    Hemoglobin 11.5 (*)    HCT 34.2 (*)    Neutro Abs 8.7 (*)    Abs Immature Granulocytes 0.09 (*)    All other components within normal limits  BASIC METABOLIC PANEL - Abnormal; Notable for the following components:   Sodium 128 (*)    Chloride 92 (*)    Glucose, Bld 192 (*)    BUN 28 (*)    Calcium 8.7 (*)    All other components within normal limits  GLUCOSE, CAPILLARY - Abnormal; Notable for the following components:   Glucose-Capillary 283 (*)    All other components within normal limits  CBC  VITAMIN B12  FOLATE  BASIC METABOLIC PANEL  HEMOGLOBIN A1C    EKG EKG Interpretation Date/Time:  Wednesday December 05 2022 18:03:36 EDT Ventricular Rate:  82 PR Interval:  202 QRS Duration:  72 QT Interval:  360 QTC Calculation: 420 R Axis:   147  Text Interpretation: Normal sinus rhythm Left posterior fascicular block Septal infarct , age undetermined Inferior infarct , age undetermined Marked ST abnormality, possible anterior subendocardial injury Abnormal ECG When compared with ECG of  20-Jan-2022 15:25, PREVIOUS ECG IS PRESENT when compared to prior, more artifact. No STEMI Confirmed by Theda Belfast ([id number]) on 12/05/2022 6:50:27 PM  Radiology CT HEAD WO CONTRAST ( )  Result Date: 12/05/2022 CLINICAL DATA:  Head trauma, minor (Age >= 65y) fall Pt bib ems for a fall. Pt was getting out of the car, lost his balance and fell onto his right hip, no LOC, did not hit his head, no blood thinners. EXAM: CT HEAD WITHOUT CONTRAST TECHNIQUE: Contiguous axial images were obtained from the base of the skull through  the vertex without intravenous contrast. RADIATION DOSE REDUCTION: This exam was performed according to the departmental dose-optimization program which includes automated exposure control, adjustment of the mA and/or kV according to patient size and/or use of iterative reconstruction technique. COMPARISON:  CT head 08/24/2022 FINDINGS: Brain: Cerebral ventricle sizes are concordant with the degree of cerebral volume loss. Patchy and confluent areas of decreased attenuation are noted throughout the deep and periventricular white matter of the cerebral hemispheres bilaterally, compatible with chronic microvascular ischemic disease. No evidence of large-territorial acute infarction. No parenchymal hemorrhage. No mass lesion. No extra-axial collection. No mass effect or midline shift. No hydrocephalus. Basilar cisterns are patent. Vascular: No hyperdense vessel. Atherosclerotic calcifications are present within the cavernous internal carotid arteries. Skull: No acute fracture or focal lesion. Sinuses/Orbits: Paranasal sinuses and mastoid air cells are clear. Right lens replacement. The orbits are unremarkable. Other: None. Electronically Signed   By: Tish Frederickson M.D.   On: 12/05/2022 19:50   DG Chest 2 View  Result Date: 12/05/2022 CLINICAL DATA:  fall, R hip injury EXAM: CHEST - 2 VIEW COMPARISON:  Chest x-ray 10/06/2022 FINDINGS: The heart and mediastinal contours are unchanged.  Atherosclerotic plaque. Vague left base airspace opacity. No pulmonary edema. No pleural effusion. No pneumothorax. No acute osseous abnormality. IMPRESSION: 1. Vague left base airspace opacity. Finding may represent atelectasis versus developing infection/inflammation. 2.  Aortic Atherosclerosis (ICD10-I70.0). Electronically Signed   By: Tish Frederickson M.D.   On: 12/05/2022 19:48   DG Hip Unilat W or Wo Pelvis 2-3 Views Right  Result Date: 12/05/2022 CLINICAL DATA:  fall, R hip injury EXAM: DG HIP (WITH OR WITHOUT PELVIS) 2-3V RIGHT COMPARISON:  X-ray left hip 01/20/2022 FINDINGS: Total left hip arthroplasty. No radiographic findings to suggest surgical hardware complication. Acute displaced right femoral neck fracture. No dislocation of either hips. No acute displaced fracture or diastasis of the bones of the pelvis. There is no evidence of arthropathy or other focal bone abnormality. IMPRESSION: Acute displaced right femoral neck fracture. Electronically Signed   By: Tish Frederickson M.D.   On: 12/05/2022 19:46    Procedures Procedures    Medications Ordered in ED Medications  acetaminophen (TYLENOL) tablet 650 mg (650 mg Oral Given 12/05/22 2251)  atorvastatin (LIPITOR) tablet 20 mg (has no administration in time range)  mupirocin ointment (BACTROBAN) 2 % 1 Application (1 Application Topical Given 12/05/22 2255)  finasteride (PROSCAR) tablet 5 mg (has no administration in time range)  dorzolamide-timolol (COSOPT) 2-0.5 % ophthalmic solution 1 drop (has no administration in time range)  ciprofloxacin (CIPRO) tablet 500 mg (has no administration in time range)  cephALEXin (KEFLEX) capsule 500 mg (has no administration in time range)  insulin aspart (novoLOG) injection 0-6 Units (has no administration in time range)    ED Course/ Medical Decision Making/ A&P                                 Medical Decision Making Amount and/or Complexity of Data Reviewed Labs: ordered. Radiology:  ordered.  Risk Decision regarding hospitalization.    Jammes Rizer is a 89 y.o. male with a past medical history significant for diabetes, CKD, hypertension, and previous falls who presents with a right hip injury today.  According to report from family to team, patient had a mechanical fall getting out of the car today and fell to his right hip.  He has had some deformity and could not bear  weight due to the pain.  Did not hit his head or chest or neck.  Patient is a difficult historian due to dementia.  He is otherwise resting comfortably.  Exam, patient's lungs clear.  Chest nontender.  Abdomen is nontender.  His left ear is wrapped up in bandage from previous infection and drainage.  Not complaining of any problems in the ear today.  Neck nontender.  Face nontender.  Chest nontender.  Abdomen nontender.   Initially, hips were nontender but when I tried to manipulate his right leg, he grimaced and screamed in pain.  Does appear patient is having discomfort in his right hip although he will not tell me.  He also may have a shortened right leg compared to left.  This is concerning for hip fracture.  Knee and ankle were nontender on focal exam.  He had intact sensation, pulse, and could wiggle the foot.  Will get x-rays of the hip/pelvis and his chest and will get screening labs.  Anticipate hip fracture and will likely need admission.   I viewed the x-rays-show hip fracture.  Will get x-ray of the chest and get a CT head.  Spoke to orthopedics who agrees with medicine admission and they will see in the morning.  N.p.o. midnight ordered and patient will be seen by orthopedics to discuss surgical management.  Patient admitted by medicine.         Final Clinical Impression(s) / ED Diagnoses Final diagnoses:  Unwitnessed fall  Severe dementia without behavioral disturbance, psychotic disturbance, mood disturbance, or anxiety, unspecified dementia type (HCC)  Hematoma of left auricular  region  Hyponatremia  Low hemoglobin  Chest x-ray abnormality  Closed fracture of right hip, initial encounter (HCC)    Clinical Impression: 1. Unwitnessed fall   2. Severe dementia without behavioral disturbance, psychotic disturbance, mood disturbance, or anxiety, unspecified dementia type (HCC)   3. Hematoma of left auricular region   4. Hyponatremia   5. Low hemoglobin   6. Chest x-ray abnormality   7. Closed fracture of right hip, initial encounter [facility])     Disposition: Admit  This note was prepared with assistance of Dragon voice recognition software. Occasional wrong-word or sound-a-like substitutions may have occurred due to the inherent limitations of voice recognition software.     Darra Rosa, Canary Brim, MD 12/05/22 2351

## 2022-12-05 NOTE — Assessment & Plan Note (Signed)
Hemoglobin 11.5 today, appears to be at baseline.  Ferritin 1 year ago 307.  No active bleeding, patient is out of age range for colonoscopy.

## 2022-12-05 NOTE — Assessment & Plan Note (Addendum)
Hematoma was noted by daughter initially on 10/20 and was taken to the ED for drainage.  They were seen by ENT today and had drainage of the hematoma again.  Hematoma is presumed to be due to hitting the ear but is unknown.  Noted to have surrounding erythema and cellulitis with use of hearing aids prior to the hematoma but no other significant otologic history.  Patient will likely benefit from ENT visit while in hospital, may need drainage while inpatient - Inpatient ENT consult - Continue home Keflex 500 mg twice daily and ciprofloxacin 500 mg daily per ENT recs - Monitor for infectious symptoms - Tylenol for pain control

## 2022-12-05 NOTE — Assessment & Plan Note (Addendum)
Per daughter, progressing over past several years, likely age-related cognitive decline with chronic ischemic changes on CT.  TSH 8/24: 2.3 WNL.  CT head: No ventriculomegaly. - Ordered reversible causes of dementia - B12 and folate ordered - Delirium and fall precautions

## 2022-12-05 NOTE — H&P (Addendum)
Hospital Admission History and Physical Service Pager: [phone]  Patient name: Paul Colon Medical record number: [id number] Date of Birth: 09/20/1929 Age: 89 y.o. Gender: male  Primary Care Provider: [facility] Consultants: Ortho Code Status: Full code - confirmed with family  Preferred Emergency Contact: Dia Crawford [phone])  Chief Complaint: Right hip fracture  Assessment and Plan: Paul Colon is a 89 y.o. male presenting with right hip fracture. Differential for presentation of this includes fall from loss of balance, syncope, seizure, BPPV, CVA.   Fall: Likeliest etiology due to loss of balance.  Patient was getting out of the car with his new prescription medications right hand, leading to partially witnessed fall.  Patient has had multiple falls over the past several months, concurrent with cognitive decline/lack of situational awareness.  History of left hip fracture late last year s/p fixation.  Vasovagal syncope: Less likely, as patient did not lose consciousness at any point per daughter.  On minimal home medications - none of which presents serious risk for orthostasis. Seizure: Patient's daughter did not endorse seizure-like activity or postictal state.  No history of seizure. BPPV: No history.  Patient did not endorse dizziness/vertigo. CVA: Head CT negative for acute intracranial pathology.  No focal deficits on exam.  Patient on aspirin 81 at home.  No history of CVA.  Assessment & Plan Hip fracture (HCC) Hip x-ray: Acute displaced right femoral neck fracture.  Patient in no pain at rest.  No hematoma identified on exam.  Slight scrapes on dorsal right hand -- otherwise no evidence of guarding/upper extremity injury.  Right clavicle intact on CXR. ED provider Dr. Rush Landmark spoke to Dr. Susa Simmonds- [facility] who instructed to continue with inpatient admission and orthopedics will see and likely continue with surgical intervention.  - Orthopedic surgery  consulted, appreciate recs - Likely surgical repair of right hip over next 24 to 48 hours - PRN Tylenol 650 every 6 hours for mild pain, will order as needed for moderate pain if indicated -N.p.o. at midnight for surgical fixation - AM CBC, BMP - PT/OT - Hold VTE prophylaxis prior to surgical intervention - Fall and delirium precautions - AM A1c Hematoma of left auricular region Hematoma was noted by daughter initially on 10/20 and was taken to the ED for drainage.  They were seen by ENT today and had drainage of the hematoma again.  Hematoma is presumed to be due to hitting the ear but is unknown.  Noted to have surrounding erythema and cellulitis with use of hearing aids prior to the hematoma but no other significant otologic history.  Patient will likely benefit from ENT visit while in hospital, may need drainage while inpatient - Inpatient ENT consult - Continue home Keflex 500 mg twice daily and ciprofloxacin 500 mg daily per ENT recs - Monitor for infectious symptoms - Tylenol for pain control Severe dementia (HCC) Per daughter, progressing over past several years, likely age-related cognitive decline with chronic ischemic changes on CT.  TSH 8/24: 2.3 WNL.  CT head: No ventriculomegaly. - Ordered reversible causes of dementia - B12 and folate ordered - Delirium and fall precautions Hyponatremia 128 on admission.  Chronic hyponatremia around 130 at baseline. - Every 8 BMPs Anemia Hemoglobin 11.5 today, appears to be at baseline.  Ferritin 1 year ago 307.  No active bleeding, patient is out of age range for colonoscopy. Chest x-ray abnormality Evidence of left base airspace opacity on chest x-ray, unknown if atelectasis or dressing infection/inflammation.  No  evidence of oxygen desaturation or dyspnea.  Will continue to monitor.  Incentive spirometry and pulmonary toilet postoperatively.   Chronic and Stable Problems:  Chronic kidney disease stage IIIa: Creatinine 1.14 today with  GFR >60. DM2: Repeat A1c as he is having surgical repair, last A1c 7.31-year ago.  CBGs 4 times daily with SSI. Hold metformin for now  HLD: Continue statin BPH: continue finasteride Cataracts: Continue Cosopt drops   FEN/GI: Regular diet>NPO at MN VTE Prophylaxis: SCD as tolerated  Disposition: Med surg   History of Present Illness:  Paul Colon is a 89 y.o. male presenting with fall that occurred when he was trying to get into the car this morning with his medications in his hand.  Seems that he lost his balance and fell on the right side, per daughter.  She partially witnessed this fall as she was on the other side of the car but did not know if he had hit his head at the time.  When she went around the car to see him, he was laying on the right side without any complaints.  He did have his leg externally rotated warranting ED visit.  Patient had no seizure-like activity nor did he have loss of consciousness during this time.  Prior to the fall, he had visited the ENT office for drainage of a left auricular hematoma that has been ongoing since 12/02/2022.  They are unsure of the cause of this but had started him on Cipro and Keflex after drainage.  Daughter reports that the hematoma is filling already, has stitches in place. No fever or chills.  Photo of the ear prior to drainage today 12/05/22  Photo of the ear after drainage today 12/05/22  Photo of ear today on admission     In the ED, received right hip x-ray that showed femoral neck fracture, CT head unremarkable for acute changes, hyponatremia to 128, BUN 28, hemoglobin 11.5, white blood cell count 11.5.   Review Of Systems: Per HPI with the following additions: No cough, nasal congestion, shortness of breath, chest pain  Pertinent Past Medical History: Type 2 diabetes Hyperlipidemia Chronic kidney disease Cognitive decline Chronic hyponatremia Normocytic anemia BPH Cataracts Remainder reviewed in history tab.    Pertinent Past Surgical History: Total hip repair on left 2023    Remainder reviewed in history tab.  Pertinent Social History: Tobacco use: None  Alcohol use: None  Other Substance use: None  Lives with daughter and her husband, ambulates around the house with cane/walker.  Pertinent Family History: None pertinent   Remainder reviewed in history tab.   Important Outpatient Medications: Aspirin 81 mg -was supposed to be on this for 30 days postoperatively from left femoral neck fracture in December 2023  Lipitor 20 mg Cosopt drops Proscar 5 mg Metformin 500 mg twice daily Multivitamin Sodium Chloride 1g tablet TID  Remainder reviewed in medication history.   Objective: BP (!) 173/91 (BP Location: Right Arm)   Pulse 77   Temp 98.8 F (37.1 C) (Oral)   Resp 18   SpO2 96%  Exam: General: NAD, nontoxic in appearance, daughter at bedside  Eyes: EOMI ENTM: Hematoma evident and some auricular fullness on the left, no evidence of swelling over the mastoid. Stitches present with dressing overlying. (See photo above) Neck: No LAD, no clavicular crepitus or deformity  Cardiovascular: Hyperdynamic S2 per Dr. Okey Dupre  Respiratory: CTAB  Gastrointestinal: NTTP, nondistended, soft  MSK: Right leg externally rotated and shortened on examination, no evidence of bruising  or deformity on evaluation of the hip. Unable to elicit tenderness over the right hip.  Scratches over the dorsom of the right hand, able to move all fingers and wrist, no TTP, no swelling or deformity. Right shoulder palpated without pain or deformity/no crepitus, able to move shoulder and RUE well.  Derm: No rashes evident  Neuro: Cognitive decline at baseline while significantly hard of hearing, difficult to assess but appears to be at baseline. No facial drooping, no dysarthria  Psych: Normal mood   Labs:  CBC BMET  Recent Labs  Lab 12/05/22 1835  WBC 11.5*  HGB 11.5*  HCT 34.2*  PLT 290   Recent Labs   Lab 12/05/22 1835  NA 128*  K 4.3  CL 92*  CO2 25  BUN 28*  CREATININE 1.14  GLUCOSE 192*  CALCIUM 8.7*     EKG: Significant artifact, ST changes similar to previous EKG from December 2023.  Narrow QRS.  QTc 420   Imaging Studies Performed:  CT HEAD WO CONTRAST ( )  Result Date: 12/05/2022 FINDINGS: Brain: Cerebral ventricle sizes are concordant with the degree of cerebral volume loss. Patchy and confluent areas of decreased attenuation are noted throughout the deep and periventricular white matter of the cerebral hemispheres bilaterally, compatible with chronic microvascular ischemic disease. No evidence of large-territorial acute infarction. No parenchymal hemorrhage. No mass lesion. No extra-axial collection. No mass effect or midline shift. No hydrocephalus. Basilar cisterns are patent. Vascular: No hyperdense vessel. Atherosclerotic calcifications are present within the cavernous internal carotid arteries. Skull: No acute fracture or focal lesion. Sinuses/Orbits: Paranasal sinuses and mastoid air cells are clear. Right lens replacement. The orbits are unremarkable. Other: None.   DG Chest 2 View  Result Date: 12/05/2022 IMPRESSION: 1. Vague left base airspace opacity. Finding may represent atelectasis versus developing infection/inflammation. 2.  Aortic Atherosclerosis (ICD10-I70.0).   DG Hip Unilat W or Wo Pelvis 2-3 Views Right  Result Date: 12/05/2022 IMPRESSION: Acute displaced right femoral neck fracture.    Acute displaced right femoral neck fracture without any acute changes to the head on head imaging Left base airspace opacity?    Alfredo Martinez, MD 12/05/2022, 8:54 PM PGY-3, [facility]  FPTS Intern pager: [phone], text pages welcome Secure chat group [facility] Teaching Service

## 2022-12-06 ENCOUNTER — Inpatient Hospital Stay (HOSPITAL_COMMUNITY): Payer: No Typology Code available for payment source

## 2022-12-06 DIAGNOSIS — S72001A Fracture of unspecified part of neck of right femur, initial encounter for closed fracture: Secondary | ICD-10-CM | POA: Diagnosis not present

## 2022-12-06 LAB — CBC
HCT: 31.7 % — ABNORMAL LOW (ref 39.0–52.0)
Hemoglobin: 11.2 g/dL — ABNORMAL LOW (ref 13.0–17.0)
MCH: 31.2 pg (ref 26.0–34.0)
MCHC: 35.3 g/dL (ref 30.0–36.0)
MCV: 88.3 fL (ref 80.0–100.0)
Platelets: 292 10*3/uL (ref 150–400)
RBC: 3.59 MIL/uL — ABNORMAL LOW (ref 4.22–5.81)
RDW: 12.7 % (ref 11.5–15.5)
WBC: 10.3 10*3/uL (ref 4.0–10.5)
nRBC: 0 % (ref 0.0–0.2)

## 2022-12-06 LAB — BASIC METABOLIC PANEL
Anion gap: 12 (ref 5–15)
Anion gap: 9 (ref 5–15)
BUN: 26 mg/dL — ABNORMAL HIGH (ref 8–23)
BUN: 24 mg/dL — ABNORMAL HIGH (ref 8–23)
CO2: 26 mmol/L (ref 22–32)
CO2: 25 mmol/L (ref 22–32)
Calcium: 9 mg/dL (ref 8.9–10.3)
Calcium: 8.8 mg/dL — ABNORMAL LOW (ref 8.9–10.3)
Chloride: 90 mmol/L — ABNORMAL LOW (ref 98–111)
Chloride: 93 mmol/L — ABNORMAL LOW (ref 98–111)
Creatinine, Ser: 1.13 mg/dL (ref 0.61–1.24)
Creatinine, Ser: 1.03 mg/dL (ref 0.61–1.24)
GFR, Estimated: >60 mL/min (ref >60)
GFR, Estimated: >60 mL/min (ref >60)
Glucose, Bld: 204 mg/dL — ABNORMAL HIGH (ref 70–99)
Glucose, Bld: 186 mg/dL — ABNORMAL HIGH (ref 70–99)
Potassium: 4 mmol/L (ref 3.5–5.1)
Potassium: 4.3 mmol/L (ref 3.5–5.1)
Sodium: 128 mmol/L — ABNORMAL LOW (ref 135–145)
Sodium: 127 mmol/L — ABNORMAL LOW (ref 135–145)

## 2022-12-06 LAB — GLUCOSE, CAPILLARY
Glucose-Capillary: 158 mg/dL — ABNORMAL HIGH (ref 70–99)
Glucose-Capillary: 190 mg/dL — ABNORMAL HIGH (ref 70–99)
Glucose-Capillary: 196 mg/dL — ABNORMAL HIGH (ref 70–99)
Glucose-Capillary: 212 mg/dL — ABNORMAL HIGH (ref 70–99)
Glucose-Capillary: 311 mg/dL — ABNORMAL HIGH (ref 70–99)
Glucose-Capillary: 328 mg/dL — ABNORMAL HIGH (ref 70–99)
Glucose-Capillary: 339 mg/dL — ABNORMAL HIGH (ref 70–99)

## 2022-12-06 LAB — SURGICAL PCR SCREEN
MRSA, PCR: NEGATIVE
Staphylococcus aureus: NEGATIVE

## 2022-12-06 LAB — VITAMIN B12: Vitamin B-12: 530 pg/mL (ref 180–914)

## 2022-12-06 LAB — HEMOGLOBIN A1C
Hgb A1c MFr Bld: 8.4 % — ABNORMAL HIGH (ref 4.8–5.6)
Mean Plasma Glucose: 194.38 mg/dL

## 2022-12-06 LAB — FOLATE: Folate: 18.4 ng/mL (ref >5.9)

## 2022-12-06 MED ORDER — LIDOCAINE-EPINEPHRINE 1 %-1:100000 IJ SOLN
20.0000 mL | Freq: Once | INTRAMUSCULAR | Status: DC
  Filled 2022-12-06: qty 20

## 2022-12-06 MED ORDER — SODIUM CHLORIDE (HYPERTONIC) 5 % OP SOLN
1.0000 [drp] | Freq: Every day | OPHTHALMIC | Status: DC
  Administered 2022-12-06: 1 [drp] via OPHTHALMIC
  Filled 2022-12-06: qty 15

## 2022-12-06 MED ORDER — SODIUM CHLORIDE 1 G PO TABS
1.0000 g | ORAL_TABLET | Freq: Three times a day (TID) | ORAL | Status: DC
  Administered 2022-12-06 – 2022-12-11 (×13): 1 g via ORAL
  Filled 2022-12-06 (×14): qty 1

## 2022-12-06 MED ORDER — ADULT MULTIVITAMIN W/MINERALS CH
1.0000 | ORAL_TABLET | Freq: Every day | ORAL | Status: DC
  Administered 2022-12-06 – 2022-12-11 (×4): 1 via ORAL
  Filled 2022-12-06 (×4): qty 1

## 2022-12-06 MED ORDER — LIDOCAINE-EPINEPHRINE 1 %-1:100000 IJ SOLN
10.0000 mL | Freq: Once | INTRAMUSCULAR | Status: DC
  Filled 2022-12-06: qty 10

## 2022-12-06 MED ORDER — INSULIN ASPART 100 UNIT/ML IJ SOLN
0.0000 [IU] | Freq: Four times a day (QID) | INTRAMUSCULAR | Status: DC
  Administered 2022-12-06 (×2): 1 [IU] via SUBCUTANEOUS
  Administered 2022-12-06: 4 [IU] via SUBCUTANEOUS
  Administered 2022-12-06: 1 [IU] via SUBCUTANEOUS
  Administered 2022-12-06: 3 [IU] via SUBCUTANEOUS
  Administered 2022-12-07 (×2): 4 [IU] via SUBCUTANEOUS
  Administered 2022-12-08: 2 [IU] via SUBCUTANEOUS
  Filled 2022-12-06: qty 1

## 2022-12-06 MED ORDER — LIDOCAINE-EPINEPHRINE 1 %-1:100000 IJ SOLN
20.0000 mL | Freq: Once | INTRAMUSCULAR | Status: AC
  Administered 2022-12-07: 20 mL via INTRADERMAL
  Filled 2022-12-06 (×2): qty 20

## 2022-12-06 MED ORDER — ENOXAPARIN SODIUM 30 MG/0.3ML IJ SOSY
30.0000 mg | PREFILLED_SYRINGE | Freq: Once | INTRAMUSCULAR | Status: DC
Start: 2022-12-06 — End: 2022-12-06

## 2022-12-06 MED ORDER — ENOXAPARIN SODIUM 40 MG/0.4ML IJ SOSY
40.0000 mg | PREFILLED_SYRINGE | Freq: Once | INTRAMUSCULAR | Status: AC
  Administered 2022-12-06: 40 mg via SUBCUTANEOUS
  Filled 2022-12-06: qty 0.4

## 2022-12-06 MED ORDER — TIMOLOL MALEATE 0.5 % OP SOLN
1.0000 [drp] | Freq: Every day | OPHTHALMIC | Status: DC
  Administered 2022-12-06: 1 [drp] via OPHTHALMIC
  Filled 2022-12-06: qty 5

## 2022-12-06 NOTE — Progress Notes (Addendum)
Daily Progress Note Intern Pager: [phone]  Patient name: Paul Colon Medical record number: [id number] Date of birth: 1929-07-08 Age: 89 y.o. Gender: male  Primary Care Provider: [facility] Consultants: Ortho Code Status: Full code - confirmed with family  Preferred Emergency Contact: Dia Crawford [phone])   Chief Complaint: Right hip fracture   Assessment and Plan: Paul Colon is a 89 y.o. male presenting with right hip fracture from fall and secondarily, left ear hematoma.  Assessment & Plan Hip fracture (HCC) Hip x-ray: Acute displaced right femoral neck fracture.  Patient in no pain at rest. Pt has diminished right patellar reflex but DP 2+.  - Orthopedic surgery consulted, appreciate recs: Plan for OR tomorrow for right hip hemiarthroplasty. May have diet today, and then NPO after MN tonight. One time order of lovenox this am, and then hold chemical DVT ppx.  - PRN Tylenol 650 every 6 hours for mild pain, will order as needed for moderate pain if indicated - AM CBC, BMP - PT/OT post-op - Fall and delirium precautions Hematoma of left auricular region Hematoma was noted by daughter initially on 10/20 and was drained by ENT on 10/23.  Hematoma is presumed to be due to hitting the ear but is unknown. Recrudescence of hematoma present again on exam today which looks similar to picture prior to drainage.  - Inpatient ENT consult - Continue home Keflex 500 mg twice daily and ciprofloxacin 500 mg daily, mupirocin ointment BID per ENT recs - Monitor for infectious symptoms: pt has been afebrile and no leukocytosis - Tylenol for pain control Severe dementia (HCC) Per daughter, progressing over past several years, likely age-related cognitive decline with chronic ischemic changes on CT.  TSH 8/24: 2.3 WNL.  CT head: No ventriculomegaly or acute findings. - Ordered reversible causes of dementia: B12 and folate pending -resume multivitamin   - Delirium and fall  precautions Hyponatremia 128 on admission and today.  Chronic hyponatremia around 130 at baseline. Discharged August 2024 with nephrology recommendations of fluid restriction of 1L/day and salt tablets 1 g TID. Prior labs suggest SIADH. - Continue salt tabs and fluid restriction as above - daily BMPs Anemia Hemoglobin 11.2 today, appears to be at baseline. Normocytic. Ferritin 1 year ago 307.  No active bleeding, patient is out of age range for colonoscopy. Chest x-ray abnormality Evidence of left base airspace opacity on chest x-ray, unknown if atelectasis or dressing infection/inflammation.  No evidence of oxygen desaturation or dyspnea.  Will continue to monitor.  Incentive spirometry and pulmonary toilet postoperatively.  Chronic and Stable Problems:  Chronic kidney disease stage IIIa: Creatinine stable at 1.13 today with GFR >60. DM2: A1c 8.4. CBGs 4 times daily with SSI. Hold metformin for now. Glucose 158. HLD: Continue statin BPH: continue finasteride Cataracts: Continue Cosopt drops   FEN/GI: Regular diet>NPO at MN VTE Prophylaxis: SCD as tolerated   Dispo: pending clinical improvement  Subjective:  Pt denies pain or SOB.  His wife, the patient's daughter [name] is the decision-maker.   Objective: Temp:  [98.1 F (36.7 C)-99 F (37.2 C)] 98.1 F (36.7 C) (10/24 0744) Pulse Rate:  [77-99] 92 (10/24 0744) Resp:  [17-20] 17 (10/24 0744) BP: (140-183)/(74-116) 159/80 (10/24 0744) SpO2:  [94 %-96 %] 94 % (10/24 0744) Weight:  [66.2 kg] 66.2 kg (10/23 1202) Physical Exam: General: Paul Colon, son-in-law, at bedside. Pt awake, hard of hearing, laying comfortably in bed, NAD.  ENT: left ear with significant hematoma and dressing. Looks similar to  picture previous to recent drainage shown in H&P note.  Cardiovascular: RRR  Respiratory: CTA in anterior and lateral ribs Extremities: Right hip externally rotated with TTP anteriorly, small abrasion to laterally. 2+ DTR patella on left, but  0/1+ on right. 2+ DP pulses. Warm lower extremities with no edema. No ecchymosis or open lesions to the right hip.   Laboratory: Most recent CBC Lab Results  Component Value Date   WBC 10.3 12/06/2022   HGB 11.2 (L) 12/06/2022   HCT 31.7 (L) 12/06/2022   MCV 88.3 12/06/2022   PLT 292 12/06/2022   Most recent BMP    Latest Ref Rng & Units 12/06/2022    8:18 AM  BMP  Glucose 70 - 99 mg/dL 109   BUN 8 - 23 mg/dL 26   Creatinine 3.23 - 1.24 mg/dL 5.57   Sodium 322 - 025 mmol/L 128   Potassium 3.5 - 5.1 mmol/L 4.0   Chloride 98 - 111 mmol/L 90   CO2 22 - 32 mmol/L 26   Calcium 8.9 - 10.3 mg/dL 9.0     Ronney Lion, Medical Student 12/06/2022, 11:42 AM  [facility] FPTS Intern pager: [phone], text pages welcome Secure chat group [facility] Teaching Service    I was personally present and performed or re-performed the history, physical exam and medical decision making activities of this service and have verified that the service and findings are accurately documented in the student's note.  Janeal Holmes, MD                  12/06/2022, 1:26 PM

## 2022-12-06 NOTE — Plan of Care (Cosign Needed)
Dr. Phineas Real and I called [name] updating her on the plan. [name] was concerned about pt's sodium of 128 here because the pt's sodium was 140 on Sunday. We discussed adding back on pt's current regimen of sodium tablets and fluid restriction. [name] is willing to discuss goals of care with palliative consult. [name] was concerned the pt's eye drops have been elevating the pt's blood glucose. We discussed monitoring the pt's glucose and adding on insulin (sliding scale) as needed iso surgery. Pt can follow up outpatient on the eye drops. [name] informed us to stop the Cosopt and continue the pt's updated/current regimen instead which includes: Timolol, durezol, and muro for which we have corrected.

## 2022-12-06 NOTE — Assessment & Plan Note (Addendum)
128 on admission and today.  Chronic hyponatremia around 130 at baseline. Discharged August 2024 with nephrology recommendations of fluid restriction of 1L/day and salt tablets 1 g TID. Prior labs suggest SIADH. - Continue salt tabs and fluid restriction as above - daily BMPs

## 2022-12-06 NOTE — Assessment & Plan Note (Addendum)
Hematoma was noted by daughter initially on 10/20 and was drained by ENT on 10/23.  Hematoma is presumed to be due to hitting the ear but is unknown. Recrudescence of hematoma present again on exam today which looks similar to picture prior to drainage.  - Inpatient ENT consult - Continue home Keflex 500 mg twice daily and ciprofloxacin 500 mg daily, mupirocin ointment BID per ENT recs - Monitor for infectious symptoms: pt has been afebrile and no leukocytosis - Tylenol for pain control

## 2022-12-06 NOTE — Progress Notes (Signed)
This nurse reached out to Lab for AM Blood collection

## 2022-12-06 NOTE — Consult Note (Signed)
Subjective:  Chief Complaint: Right hip pain  HPI: Paul Colon, 89 y.o. male, PMH significant for severe dementia,DM, and CKD presented to the ED on 10/23 via EMS due to mechanical fall at home. This resulted in immediate right hip pain and inability to bear weight. Workup in the emergency room found Paul Colon to have a right hip fracture. He was admitted by the hospitalist service and ortho consulted for management of the fracture. Today he is resting in bed and drowsy. Per the RN, pain has been controlled with tylenol as needed with no signs of significant discomfort. Son-in-law is at the bedside, who is speaking on the patient's behalf.    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   Fracture of femoral neck, left, closed (HCC) 01/20/2022   Fall at home, initial encounter 01/20/2022   AKI (acute kidney injury) (HCC) 01/20/2022   BPH (benign prostatic hyperplasia) 01/20/2022   Hyperlipidemia 01/20/2022   Hyponatremia 09/06/2021   Generalized weakness 09/06/2021   Uncontrolled type 2 diabetes mellitus with hyperglycemia, without long-term current use of insulin (HCC) 09/06/2021   Chronic kidney disease, stage 3a (HCC) 09/06/2021   Anemia 09/06/2021    Past Medical History:  Diagnosis Date   Diabetes mellitus without complication (HCC)    Kidney disease    Severe dementia (HCC) 12/05/2022    Past Surgical History:  Procedure Laterality Date   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   Procedure: TOTAL HIP ARTHROPLASTY ANTERIOR APPROACH;  Surgeon: Durene Romans, MD;  Location: [facility] OR;  Service: Orthopedics;  Laterality: Left;    Prior to Admission medications   Medication Sig Start Date End Date  Taking? Authorizing Provider  aspirin EC 81 MG tablet Take 81 mg by mouth daily. Swallow whole.   Yes [provider]  atorvastatin (LIPITOR) 10 MG tablet Take 10 mg by mouth at bedtime.   Yes [provider]  cephALEXin (KEFLEX) 500 MG capsule Take 1 capsule (500 mg total) by mouth 2 (two) times daily. 12/05/22  Yes Jovita Kussmaul B, MD  ciprofloxacin (CIPRO) 500 MG tablet Take 1 tablet (500 mg total) by mouth every 12 (twelve) hours. 12/05/22  Yes Jovita Kussmaul B, MD  Difluprednate 0.05 % EMUL Place 1 drop into the right eye 2 (two) times daily. Night time 1st   Yes [provider]  finasteride (PROSCAR) 5 MG tablet Take 5 mg by mouth every evening. For enlarged prostate   Yes [provider]  magnesium oxide (MAG-OX) 400 (240 Mg) MG tablet Take 400 mg by mouth daily.   Yes [provider]  metFORMIN (GLUCOPHAGE) 1000 MG tablet Take 1,000 mg by mouth 2 (two) times daily with a meal.   Yes [provider]  Multiple Vitamins-Minerals (ONE-A-DAY MENS 50+ PO) Take 1 tablet by mouth in the morning.   Yes [provider]  mupirocin ointment (BACTROBAN) 2 % Apply 1 Application topically 2 (two) times daily. Over the yellow gauze twice per day 12/05/22  Yes Jovita Kussmaul B, MD  sodium chloride (MURO 128) 5 % ophthalmic solution Place 1 drop into the right eye at bedtime.   Yes [provider]  sodium chloride 1 g tablet Take 1 tablet (1  g total) by mouth 3 (three) times daily with meals. 10/07/22  Yes Pokhrel, Laxman, MD  timolol (TIMOPTIC) 0.5 % ophthalmic solution Place 1 drop into both eyes daily.   Yes [provider]  atorvastatin (LIPITOR) 20 MG tablet Take 1 tablet (20 mg total) by mouth every evening. Patient not taking: Reported on 12/05/2022 01/25/22   Joseph Art, DO  metFORMIN (GLUCOPHAGE) 500 MG tablet Take 1 tablet (500 mg total) by mouth 2 (two) times daily with a meal. Patient not taking: Reported on  12/05/2022 04/23/21   Margarita Grizzle, MD    Allergies  Allergen Reactions   Alogliptin Other (See Comments)    Unknown reaction    Penicillin G Rash    Social History   Socioeconomic History   Marital status: Widowed    Spouse name: Not on file   Number of children: Not on file   Years of education: Not on file   Highest education level: Not on file  Occupational History   Not on file  Tobacco Use   Smoking status: Never   Smokeless tobacco: Never  Vaping Use   Vaping status: Never Used  Substance and Sexual Activity   Alcohol use: Never   Drug use: Never   Sexual activity: Not on file  Other Topics Concern   Not on file  Social History Narrative   Not on file   Social Determinants of Health   Financial Resource Strain: Not on file  Food Insecurity: No Food Insecurity (12/05/2022)   Hunger Vital Sign    Worried About Running Out of Food in the Last Year: Never true    Ran Out of Food in the Last Year: Never true  Transportation Needs: No Transportation Needs (12/05/2022)   PRAPARE - Administrator, Civil Service (Medical): No    Lack of Transportation (Non-Medical): No  Physical Activity: Not on file  Stress: Not on file  Social Connections: Unknown (09/26/2021)   Received from [facility]   Social Network    Social Network: Not on file  Intimate Partner Violence: Not At Risk (12/05/2022)   Humiliation, Afraid, Rape, and Kick questionnaire    Fear of Current or Ex-Partner: No    Emotionally Abused: No    Physically Abused: No    Sexually Abused: No    Tobacco Use: Low Risk  (12/05/2022)   Patient History    Smoking Tobacco Use: Never    Smokeless Tobacco Use: Never    Passive Exposure: Not on file   Social History   Substance and Sexual Activity  Alcohol Use Never    History reviewed. No pertinent family history.  Review of Systems  Constitutional:  Negative for chills and fever.  HENT:  Negative for congestion, sore  throat and tinnitus.   Eyes:  Negative for double vision, photophobia and pain.  Respiratory:  Negative for cough, shortness of breath and wheezing.   Cardiovascular:  Negative for chest pain, palpitations and orthopnea.  Gastrointestinal:  Negative for heartburn, nausea and vomiting.  Genitourinary:  Negative for dysuria, frequency and urgency.  Musculoskeletal:  Positive for joint pain.  Neurological:  Negative for dizziness, weakness and headaches.     Objective:  Physical Exam: Well nourished and well developed. General: Alert and oriented x3, cooperative and pleasant, no acute distress. Head: normocephalic, atraumatic, neck supple. Eyes: EOMI. Musculoskeletal:  Right Lower Extremity: RLE shortened and externally rotated. No ecchymosis or open lesions to the right hip. Calves soft  and nontender. Wiggling toes on exam.  Neuro: Distal pulses 2+. Sensation to light touch intact in LE.   Vital signs in last 24 hours: Temp:  [98.1 F (36.7 C)-99 F (37.2 C)] 98.1 F (36.7 C) (10/24 0744) Pulse Rate:  [77-99] 92 (10/24 0744) Resp:  [17-20] 17 (10/24 0744) BP: (140-183)/(74-116) 159/80 (10/24 0744) SpO2:  [94 %-96 %] 94 % (10/24 0744) Weight:  [66.2 kg] 66.2 kg (10/23 1202)  Imaging Review AP and lateral XR of the right hip shows a displaced femoral neck fracture.   Assessment/Plan:  Displaced right femoral neck fracture  Mr. Apostolopoulos has an acute displaced right femoral neck fracture. This will require surgical intervention, discussed with patient's son-in-law today the risks and benefits of hip hemiarthroplasty. Dr. Lequita Halt has spoken with Samson Frederic, MD who plans to perform the surgery tomorrow.   NPO at midnight Dr. Linna Caprice has already placed consent and orders in chart.  Lovenox today  Arther Abbott, PA-C Orthopedic Surgery [facility] [place]

## 2022-12-06 NOTE — H&P (View-Only) (Signed)
Asked to take over for surgery by Dr. Lequita Halt. Plan for OR tomorrow for right hip hemiarthroplasty. May have diet today, and then NPO after MN tonight. Will give OTO lovenox this am, and then hold chemical DVT ppx. Full consult to follow.

## 2022-12-06 NOTE — Assessment & Plan Note (Addendum)
Per daughter, progressing over past several years, likely age-related cognitive decline with chronic ischemic changes on CT.  TSH 8/24: 2.3 WNL.  CT head: No ventriculomegaly or acute findings. - Ordered reversible causes of dementia: B12 and folate pending -resume multivitamin   - Delirium and fall precautions

## 2022-12-06 NOTE — Progress Notes (Signed)
PT Cancellation Note  Patient Details Name: Paul Colon MRN: [id number] DOB: [birth year]   Cancelled Treatment:    Reason Eval/Treat Not Completed: Medical issues which prohibited therapy this morning as pt with acute displaced R femoral neck fx and does not have surgery planned until tomorrow (10/25) per ortho. Will continue to follow and evaluate after surgery.   Vickki Muff, PT, DPT   Acute Rehabilitation Department Office [phone] Secure Chat Communication Preferred   Ronnie Derby 12/06/2022, 8:36 AM

## 2022-12-06 NOTE — Assessment & Plan Note (Addendum)
Hemoglobin 11.2 today, appears to be at baseline. Normocytic. Ferritin 1 year ago 307.  No active bleeding, patient is out of age range for colonoscopy.

## 2022-12-06 NOTE — Plan of Care (Signed)

## 2022-12-06 NOTE — Plan of Care (Signed)
  Problem: Nutrition: Goal: Adequate nutrition will be maintained Outcome: Progressing   Problem: Elimination: Goal: Will not experience complications related to bowel motility Outcome: Progressing   Problem: Pain Management: Goal: General experience of comfort will improve Outcome: Progressing   Problem: Activity: Goal: Risk for activity intolerance will decrease Outcome: Not Progressing

## 2022-12-06 NOTE — TOC CAGE-AID Note (Signed)
Transition of Care [facility]) - CAGE-AID Screening   Patient Details  Name: Paul Colon MRN: [id number] Date of Birth: 1930-01-27  Transition of Care [facility]) CM/SW Contact:    Leota Sauers, RN Phone Number: 12/06/2022, 6:42 AM   Clinical Narrative:  No alcohol use, no illicit substance use. Resources not given at this time.  CAGE-AID Screening:    Have You Ever Felt You Ought to Cut Down on Your Drinking or Drug Use?: No Have People Annoyed You By Critizing Your Drinking Or Drug Use?: No Have You Felt Bad Or Guilty About Your Drinking Or Drug Use?: No Have You Ever Had a Drink or Used Drugs First Thing In The Morning to Steady Your Nerves or to Get Rid of a Hangover?: No CAGE-AID Score: 0  Substance Abuse Education Offered: No

## 2022-12-06 NOTE — Progress Notes (Signed)
Asked to take over for surgery by Dr. Lequita Halt. Plan for OR tomorrow for right hip hemiarthroplasty. May have diet today, and then NPO after MN tonight. Will give OTO lovenox this am, and then hold chemical DVT ppx. Full consult to follow.

## 2022-12-06 NOTE — Assessment & Plan Note (Addendum)
Hip x-ray: Acute displaced right femoral neck fracture.  Patient in no pain at rest. Pt has diminished right patellar reflex but DP 2+.  - Orthopedic surgery consulted, appreciate recs: Plan for OR tomorrow for right hip hemiarthroplasty. May have diet today, and then NPO after MN tonight. One time order of lovenox this am, and then hold chemical DVT ppx.  - PRN Tylenol 650 every 6 hours for mild pain, will order as needed for moderate pain if indicated - AM CBC, BMP - PT/OT post-op - Fall and delirium precautions

## 2022-12-06 NOTE — Progress Notes (Signed)
OT Cancellation Note  Patient Details Name: [name] MRN: [id number] DOB: 11-Apr-1929   Cancelled Treatment:    Reason Eval/Treat Not Completed: (P) Medical issues which prohibited therapy, surgery planned tomorrow, will see after when appropriate.   Alexis Goodell 12/06/2022, 9:59 AM

## 2022-12-06 NOTE — Assessment & Plan Note (Addendum)
Evidence of left base airspace opacity on chest x-ray, unknown if atelectasis or dressing infection/inflammation.  No evidence of oxygen desaturation or dyspnea.  Will continue to monitor.  Incentive spirometry and pulmonary toilet postoperatively.

## 2022-12-07 ENCOUNTER — Inpatient Hospital Stay (HOSPITAL_COMMUNITY): Payer: No Typology Code available for payment source

## 2022-12-07 ENCOUNTER — Encounter (HOSPITAL_COMMUNITY)
Admission: EM | Disposition: A | Discharge status: Home Health | Payer: Self-pay | Source: Home / Self Care | Attending: Family Medicine

## 2022-12-07 ENCOUNTER — Ambulatory Visit (INDEPENDENT_AMBULATORY_CARE_PROVIDER_SITE_OTHER): Payer: No Typology Code available for payment source

## 2022-12-07 ENCOUNTER — Inpatient Hospital Stay (HOSPITAL_COMMUNITY): Payer: No Typology Code available for payment source | Admitting: Anesthesiology

## 2022-12-07 ENCOUNTER — Encounter (HOSPITAL_COMMUNITY): Payer: Self-pay | Admitting: Student

## 2022-12-07 ENCOUNTER — Other Ambulatory Visit: Payer: Self-pay

## 2022-12-07 DIAGNOSIS — N189 Chronic kidney disease, unspecified: Secondary | ICD-10-CM | POA: Diagnosis not present

## 2022-12-07 DIAGNOSIS — I129 Hypertensive chronic kidney disease with stage 1 through stage 4 chronic kidney disease, or unspecified chronic kidney disease: Secondary | ICD-10-CM

## 2022-12-07 DIAGNOSIS — S00432D Contusion of left ear, subsequent encounter: Secondary | ICD-10-CM

## 2022-12-07 DIAGNOSIS — E1122 Type 2 diabetes mellitus with diabetic chronic kidney disease: Secondary | ICD-10-CM

## 2022-12-07 DIAGNOSIS — S72001A Fracture of unspecified part of neck of right femur, initial encounter for closed fracture: Secondary | ICD-10-CM | POA: Diagnosis not present

## 2022-12-07 DIAGNOSIS — H61122 Hematoma of pinna, left ear: Secondary | ICD-10-CM

## 2022-12-07 HISTORY — PX: ANTERIOR APPROACH HEMI HIP ARTHROPLASTY: SHX6690

## 2022-12-07 LAB — CBC
HCT: 32.3 % — ABNORMAL LOW (ref 39.0–52.0)
Hemoglobin: 11.1 g/dL — ABNORMAL LOW (ref 13.0–17.0)
MCH: 30.3 pg (ref 26.0–34.0)
MCHC: 34.4 g/dL (ref 30.0–36.0)
MCV: 88.3 fL (ref 80.0–100.0)
Platelets: 263 10*3/uL (ref 150–400)
RBC: 3.66 MIL/uL — ABNORMAL LOW (ref 4.22–5.81)
RDW: 12.7 % (ref 11.5–15.5)
WBC: 10.9 10*3/uL — ABNORMAL HIGH (ref 4.0–10.5)
nRBC: 0 % (ref 0.0–0.2)

## 2022-12-07 LAB — TYPE AND SCREEN
ABO/RH(D): O POS
Antibody Screen: NEGATIVE

## 2022-12-07 LAB — BASIC METABOLIC PANEL
Anion gap: 11 (ref 5–15)
BUN: 21 mg/dL (ref 8–23)
CO2: 22 mmol/L (ref 22–32)
Calcium: 8.3 mg/dL — ABNORMAL LOW (ref 8.9–10.3)
Chloride: 92 mmol/L — ABNORMAL LOW (ref 98–111)
Creatinine, Ser: 1.22 mg/dL (ref 0.61–1.24)
GFR, Estimated: 56 mL/min — ABNORMAL LOW (ref >60)
Glucose, Bld: 183 mg/dL — ABNORMAL HIGH (ref 70–99)
Potassium: 4 mmol/L (ref 3.5–5.1)
Sodium: 125 mmol/L — ABNORMAL LOW (ref 135–145)

## 2022-12-07 LAB — GLUCOSE, CAPILLARY
Glucose-Capillary: 148 mg/dL — ABNORMAL HIGH (ref 70–99)
Glucose-Capillary: 151 mg/dL — ABNORMAL HIGH (ref 70–99)
Glucose-Capillary: 189 mg/dL — ABNORMAL HIGH (ref 70–99)
Glucose-Capillary: 141 mg/dL — ABNORMAL HIGH (ref 70–99)
Glucose-Capillary: 147 mg/dL — ABNORMAL HIGH (ref 70–99)
Glucose-Capillary: 335 mg/dL — ABNORMAL HIGH (ref 70–99)

## 2022-12-07 SURGERY — HEMIARTHROPLASTY, HIP, DIRECT ANTERIOR APPROACH, FOR FRACTURE
Anesthesia: General | Site: Hip | Laterality: Right

## 2022-12-07 MED ORDER — SODIUM CHLORIDE (PF) 0.9 % IJ SOLN
INTRAMUSCULAR | Status: DC | PRN
  Administered 2022-12-07: 30 mL

## 2022-12-07 MED ORDER — PHENOL 1.4 % MT LIQD: 1.0000 | OROMUCOSAL | Status: DC | PRN

## 2022-12-07 MED ORDER — ROCURONIUM BROMIDE 10 MG/ML (PF) SYRINGE
PREFILLED_SYRINGE | INTRAVENOUS | Status: DC | PRN
  Administered 2022-12-07: 20 mg via INTRAVENOUS
  Administered 2022-12-07: 50 mg via INTRAVENOUS

## 2022-12-07 MED ORDER — ACETAMINOPHEN 500 MG PO TABS
ORAL_TABLET | ORAL | Status: AC
  Filled 2022-12-07: qty 2

## 2022-12-07 MED ORDER — ONDANSETRON HCL 4 MG/2ML IJ SOLN
INTRAMUSCULAR | Status: AC
  Filled 2022-12-07: qty 4

## 2022-12-07 MED ORDER — VANCOMYCIN HCL 1000 MG IV SOLR
INTRAVENOUS | Status: DC | PRN
  Administered 2022-12-07: 1000 mg via TOPICAL

## 2022-12-07 MED ORDER — ONDANSETRON HCL 4 MG/2ML IJ SOLN
INTRAMUSCULAR | Status: DC | PRN
  Administered 2022-12-07: 4 mg via INTRAVENOUS

## 2022-12-07 MED ORDER — OXYCODONE HCL 5 MG/5ML PO SOLN: 5.0000 mg | Freq: Once | ORAL | Status: DC | PRN

## 2022-12-07 MED ORDER — BUPIVACAINE-EPINEPHRINE (PF) 0.5% -1:200000 IJ SOLN
INTRAMUSCULAR | Status: AC
  Filled 2022-12-07: qty 30

## 2022-12-07 MED ORDER — DEXAMETHASONE SODIUM PHOSPHATE 10 MG/ML IJ SOLN
INTRAMUSCULAR | Status: AC
Start: 2022-12-07 — End: ?
  Filled 2022-12-07: qty 1

## 2022-12-07 MED ORDER — STERILE WATER FOR IRRIGATION IR SOLN
Status: DC | PRN
  Administered 2022-12-07: 1000 mL

## 2022-12-07 MED ORDER — FENTANYL CITRATE (PF) 250 MCG/5ML IJ SOLN
INTRAMUSCULAR | Status: DC | PRN
  Administered 2022-12-07: 50 ug via INTRAVENOUS
  Administered 2022-12-07: 25 ug via INTRAVENOUS

## 2022-12-07 MED ORDER — ONDANSETRON HCL 4 MG PO TABS: 4.0000 mg | ORAL_TABLET | Freq: Four times a day (QID) | ORAL | Status: DC | PRN

## 2022-12-07 MED ORDER — INSULIN ASPART 100 UNIT/ML IJ SOLN
0.0000 [IU] | INTRAMUSCULAR | Status: DC | PRN
  Administered 2022-12-07: 2 [IU] via SUBCUTANEOUS

## 2022-12-07 MED ORDER — PROPOFOL 10 MG/ML IV BOLUS
INTRAVENOUS | Status: DC | PRN
  Administered 2022-12-07: 40 mg via INTRAVENOUS

## 2022-12-07 MED ORDER — BISACODYL 10 MG RE SUPP: 10.0000 mg | Freq: Every day | RECTAL | Status: DC | PRN

## 2022-12-07 MED ORDER — CEFAZOLIN SODIUM-DEXTROSE 2-4 GM/100ML-% IV SOLN
2.0000 g | Freq: Four times a day (QID) | INTRAVENOUS | Status: AC
  Administered 2022-12-08: 2 g via INTRAVENOUS
  Filled 2022-12-07 (×2): qty 100

## 2022-12-07 MED ORDER — PROPOFOL 10 MG/ML IV BOLUS
INTRAVENOUS | Status: AC
  Filled 2022-12-07: qty 20

## 2022-12-07 MED ORDER — ENOXAPARIN SODIUM 40 MG/0.4ML IJ SOSY
40.0000 mg | PREFILLED_SYRINGE | INTRAMUSCULAR | Status: DC
  Administered 2022-12-08 – 2022-12-11 (×4): 40 mg via SUBCUTANEOUS
  Filled 2022-12-07 (×4): qty 0.4

## 2022-12-07 MED ORDER — LACTATED RINGERS IV SOLN: INTRAVENOUS | Status: DC

## 2022-12-07 MED ORDER — ACETAMINOPHEN 500 MG PO TABS: 1000.0000 mg | ORAL_TABLET | Freq: Once | ORAL | Status: DC

## 2022-12-07 MED ORDER — MENTHOL 3 MG MT LOZG: 1.0000 | LOZENGE | OROMUCOSAL | Status: DC | PRN

## 2022-12-07 MED ORDER — DEXAMETHASONE SODIUM PHOSPHATE 10 MG/ML IJ SOLN
INTRAMUSCULAR | Status: DC | PRN
  Administered 2022-12-07: 4 mg via INTRAVENOUS

## 2022-12-07 MED ORDER — DIFLUPREDNATE 0.05 % OP EMUL
1.0000 [drp] | Freq: Two times a day (BID) | OPHTHALMIC | Status: DC
  Administered 2022-12-07 – 2022-12-11 (×8): 1 [drp] via OPHTHALMIC

## 2022-12-07 MED ORDER — CHLORHEXIDINE GLUCONATE 0.12 % MT SOLN
OROMUCOSAL | Status: AC
  Administered 2022-12-07: 15 mL via OROMUCOSAL
  Filled 2022-12-07: qty 15

## 2022-12-07 MED ORDER — PRONTOSAN WOUND IRRIGATION OPTIME
TOPICAL | Status: DC | PRN
  Administered 2022-12-07: 350 mL

## 2022-12-07 MED ORDER — ORAL CARE MOUTH RINSE: 15.0000 mL | Freq: Once | OROMUCOSAL | Status: AC

## 2022-12-07 MED ORDER — PHENYLEPHRINE HCL-NACL 20-0.9 MG/250ML-% IV SOLN
INTRAVENOUS | Status: DC | PRN
  Administered 2022-12-07: 60 ug/min via INTRAVENOUS
  Administered 2022-12-07: 30 ug/min via INTRAVENOUS

## 2022-12-07 MED ORDER — ROCURONIUM BROMIDE 10 MG/ML (PF) SYRINGE
PREFILLED_SYRINGE | INTRAVENOUS | Status: AC
  Filled 2022-12-07: qty 20

## 2022-12-07 MED ORDER — SODIUM CHLORIDE (HYPERTONIC) 5 % OP OINT
TOPICAL_OINTMENT | Freq: Every day | OPHTHALMIC | Status: DC
  Administered 2022-12-07: 1 via OPHTHALMIC

## 2022-12-07 MED ORDER — MORPHINE SULFATE (PF) 2 MG/ML IV SOLN: 0.5000 mg | INTRAVENOUS | Status: DC | PRN

## 2022-12-07 MED ORDER — LIDOCAINE 2% (20 MG/ML) 5 ML SYRINGE
INTRAMUSCULAR | Status: AC
  Filled 2022-12-07: qty 10

## 2022-12-07 MED ORDER — ASPIRIN 325 MG PO TBEC
325.0000 mg | DELAYED_RELEASE_TABLET | Freq: Every day | ORAL | Status: DC
  Administered 2022-12-07 – 2022-12-09 (×3): 325 mg via ORAL
  Filled 2022-12-07 (×3): qty 1

## 2022-12-07 MED ORDER — OXYCODONE HCL 5 MG PO TABS: 2.5000 mg | ORAL_TABLET | ORAL | Status: DC | PRN

## 2022-12-07 MED ORDER — METOCLOPRAMIDE HCL 5 MG PO TABS: 5.0000 mg | ORAL_TABLET | Freq: Three times a day (TID) | ORAL | Status: DC | PRN

## 2022-12-07 MED ORDER — CHLORHEXIDINE GLUCONATE 0.12 % MT SOLN
15.0000 mL | Freq: Once | OROMUCOSAL | Status: AC
Start: 2022-12-07 — End: 2022-12-07

## 2022-12-07 MED ORDER — SODIUM CHLORIDE (HYPERTONIC) 5 % OP OINT
TOPICAL_OINTMENT | Freq: Every day | OPHTHALMIC | Status: DC
  Filled 2022-12-07: qty 3.5

## 2022-12-07 MED ORDER — CEFAZOLIN SODIUM-DEXTROSE 2-4 GM/100ML-% IV SOLN
2.0000 g | INTRAVENOUS | Status: AC
  Administered 2022-12-07: 2 g via INTRAVENOUS

## 2022-12-07 MED ORDER — DEXMEDETOMIDINE HCL IN NACL 80 MCG/20ML IV SOLN
INTRAVENOUS | Status: AC
  Filled 2022-12-07: qty 20

## 2022-12-07 MED ORDER — EPHEDRINE 5 MG/ML INJ
INTRAVENOUS | Status: AC
  Filled 2022-12-07: qty 5

## 2022-12-07 MED ORDER — HYDROCODONE-ACETAMINOPHEN 7.5-325 MG PO TABS: 1.0000 | ORAL_TABLET | ORAL | Status: DC | PRN

## 2022-12-07 MED ORDER — METOCLOPRAMIDE HCL 5 MG/ML IJ SOLN: 5.0000 mg | Freq: Three times a day (TID) | INTRAMUSCULAR | Status: DC | PRN

## 2022-12-07 MED ORDER — ONDANSETRON HCL 4 MG/2ML IJ SOLN: 4.0000 mg | Freq: Four times a day (QID) | INTRAMUSCULAR | Status: DC | PRN

## 2022-12-07 MED ORDER — PHENYLEPHRINE 80 MCG/ML (10ML) SYRINGE FOR IV PUSH (FOR BLOOD PRESSURE SUPPORT)
PREFILLED_SYRINGE | INTRAVENOUS | Status: DC | PRN
  Administered 2022-12-07: 160 ug via INTRAVENOUS
  Administered 2022-12-07: 80 ug via INTRAVENOUS
  Administered 2022-12-07: 30 ug via INTRAVENOUS

## 2022-12-07 MED ORDER — 0.9 % SODIUM CHLORIDE (POUR BTL) OPTIME
TOPICAL | Status: DC | PRN
  Administered 2022-12-07: 1000 mL

## 2022-12-07 MED ORDER — SENNA 8.6 MG PO TABS
1.0000 | ORAL_TABLET | Freq: Two times a day (BID) | ORAL | Status: DC
  Administered 2022-12-07 – 2022-12-11 (×8): 8.6 mg via ORAL
  Filled 2022-12-07 (×8): qty 1

## 2022-12-07 MED ORDER — KETOROLAC TROMETHAMINE 30 MG/ML IJ SOLN
INTRAMUSCULAR | Status: DC | PRN
  Administered 2022-12-07: 30 mg

## 2022-12-07 MED ORDER — CEFAZOLIN SODIUM-DEXTROSE 2-4 GM/100ML-% IV SOLN
INTRAVENOUS | Status: AC
  Administered 2022-12-07: 2 g via INTRAVENOUS
  Filled 2022-12-07: qty 100

## 2022-12-07 MED ORDER — PHENYLEPHRINE 80 MCG/ML (10ML) SYRINGE FOR IV PUSH (FOR BLOOD PRESSURE SUPPORT)
PREFILLED_SYRINGE | INTRAVENOUS | Status: AC
  Filled 2022-12-07: qty 20

## 2022-12-07 MED ORDER — ROCURONIUM BROMIDE 10 MG/ML (PF) SYRINGE
PREFILLED_SYRINGE | INTRAVENOUS | Status: AC
  Filled 2022-12-07: qty 10

## 2022-12-07 MED ORDER — KETOROLAC TROMETHAMINE 30 MG/ML IJ SOLN
INTRAMUSCULAR | Status: AC
  Filled 2022-12-07: qty 1

## 2022-12-07 MED ORDER — TIMOLOL MALEATE 0.5 % OP SOLG
1.0000 [drp] | Freq: Every day | OPHTHALMIC | Status: DC
  Administered 2022-12-07 – 2022-12-11 (×5): 1 [drp] via OPHTHALMIC

## 2022-12-07 MED ORDER — ALBUMIN HUMAN 5 % IV SOLN: INTRAVENOUS | Status: DC | PRN

## 2022-12-07 MED ORDER — CEFAZOLIN SODIUM 1 G IJ SOLR
INTRAMUSCULAR | Status: AC
Start: 2022-12-07 — End: ?
  Filled 2022-12-07: qty 30

## 2022-12-07 MED ORDER — TRANEXAMIC ACID-NACL 1000-0.7 MG/100ML-% IV SOLN
1000.0000 mg | INTRAVENOUS | Status: AC
  Administered 2022-12-07: 1000 mg via INTRAVENOUS

## 2022-12-07 MED ORDER — POVIDONE-IODINE 10 % EX SWAB
2.0000 | Freq: Once | CUTANEOUS | Status: AC
  Administered 2022-12-07: 2 via TOPICAL

## 2022-12-07 MED ORDER — POVIDONE-IODINE 10 % EX SWAB: 2.0000 | Freq: Once | CUTANEOUS | Status: DC

## 2022-12-07 MED ORDER — ONDANSETRON HCL 4 MG/2ML IJ SOLN
INTRAMUSCULAR | Status: AC
  Filled 2022-12-07: qty 2

## 2022-12-07 MED ORDER — DOCUSATE SODIUM 100 MG PO CAPS
100.0000 mg | ORAL_CAPSULE | Freq: Two times a day (BID) | ORAL | Status: DC
  Administered 2022-12-07 – 2022-12-11 (×7): 100 mg via ORAL
  Filled 2022-12-07 (×7): qty 1

## 2022-12-07 MED ORDER — LIDOCAINE 2% (20 MG/ML) 5 ML SYRINGE
INTRAMUSCULAR | Status: DC | PRN
  Administered 2022-12-07: 60 mg via INTRAVENOUS

## 2022-12-07 MED ORDER — DIFLUPREDNATE 0.05 % OP EMUL
1.0000 [drp] | Freq: Two times a day (BID) | OPHTHALMIC | Status: DC
  Administered 2022-12-07 – 2022-12-09 (×3): 1 [drp] via OPHTHALMIC

## 2022-12-07 MED ORDER — FENTANYL CITRATE (PF) 250 MCG/5ML IJ SOLN
INTRAMUSCULAR | Status: AC
  Filled 2022-12-07: qty 5

## 2022-12-07 MED ORDER — SODIUM CHLORIDE (PF) 0.9 % IJ SOLN
INTRAMUSCULAR | Status: AC
Start: 2022-12-07 — End: ?
  Filled 2022-12-07: qty 10

## 2022-12-07 MED ORDER — HYDROCODONE-ACETAMINOPHEN 5-325 MG PO TABS: 1.0000 | ORAL_TABLET | ORAL | Status: DC | PRN

## 2022-12-07 MED ORDER — HYDROCODONE-ACETAMINOPHEN 5-325 MG PO TABS: 1.0000 | ORAL_TABLET | Freq: Four times a day (QID) | ORAL | Status: DC | PRN

## 2022-12-07 MED ORDER — LACTATED RINGERS IV SOLN: INTRAVENOUS | Status: AC

## 2022-12-07 MED ORDER — EPHEDRINE 5 MG/ML INJ
INTRAVENOUS | Status: AC
  Filled 2022-12-07: qty 10

## 2022-12-07 MED ORDER — BUPIVACAINE-EPINEPHRINE (PF) 0.5% -1:200000 IJ SOLN
INTRAMUSCULAR | Status: DC | PRN
  Administered 2022-12-07: 30 mL

## 2022-12-07 MED ORDER — OXYCODONE HCL 5 MG PO TABS: 5.0000 mg | ORAL_TABLET | Freq: Once | ORAL | Status: DC | PRN

## 2022-12-07 MED ORDER — TRANEXAMIC ACID-NACL 1000-0.7 MG/100ML-% IV SOLN
INTRAVENOUS | Status: AC
  Filled 2022-12-07: qty 100

## 2022-12-07 MED ORDER — CHLORHEXIDINE GLUCONATE 4 % EX SOLN: 60.0000 mL | Freq: Once | CUTANEOUS | Status: DC

## 2022-12-07 MED ORDER — PROPOFOL 1000 MG/100ML IV EMUL
INTRAVENOUS | Status: AC
  Filled 2022-12-07: qty 100

## 2022-12-07 MED ORDER — SUCCINYLCHOLINE CHLORIDE 200 MG/10ML IV SOSY
PREFILLED_SYRINGE | INTRAVENOUS | Status: AC
  Filled 2022-12-07: qty 40

## 2022-12-07 MED ORDER — SUGAMMADEX SODIUM 200 MG/2ML IV SOLN
INTRAVENOUS | Status: DC | PRN
  Administered 2022-12-07: 200 mg via INTRAVENOUS

## 2022-12-07 MED ORDER — SODIUM CHLORIDE 0.9 % IR SOLN
Status: DC | PRN
  Administered 2022-12-07 (×2): 1000 mL

## 2022-12-07 MED ORDER — DORZOLAMIDE HCL-TIMOLOL MAL 2-0.5 % OP SOLN: 1.0000 [drp] | Freq: Two times a day (BID) | OPHTHALMIC | Status: DC

## 2022-12-07 MED ORDER — POLYETHYLENE GLYCOL 3350 17 G PO PACK: 17.0000 g | PACK | Freq: Every day | ORAL | Status: DC | PRN

## 2022-12-07 MED ORDER — VANCOMYCIN HCL 1000 MG IV SOLR
INTRAVENOUS | Status: AC
  Filled 2022-12-07: qty 20

## 2022-12-07 MED ORDER — ACETAMINOPHEN 325 MG PO TABS
650.0000 mg | ORAL_TABLET | Freq: Four times a day (QID) | ORAL | Status: DC
  Administered 2022-12-07 – 2022-12-11 (×15): 650 mg via ORAL
  Filled 2022-12-07 (×16): qty 2

## 2022-12-07 MED ORDER — FENTANYL CITRATE (PF) 100 MCG/2ML IJ SOLN: 25.0000 ug | INTRAMUSCULAR | Status: DC | PRN

## 2022-12-07 MED ORDER — AMISULPRIDE (ANTIEMETIC) 5 MG/2ML IV SOLN: 10.0000 mg | Freq: Once | INTRAVENOUS | Status: DC | PRN

## 2022-12-07 MED ORDER — PROPOFOL 500 MG/50ML IV EMUL
INTRAVENOUS | Status: DC | PRN
  Administered 2022-12-07: 50 ug/kg/min via INTRAVENOUS

## 2022-12-07 SURGICAL SUPPLY — 60 items
ADH SKN CLS APL DERMABOND .7 (GAUZE/BANDAGES/DRESSINGS) ×1
ALCOHOL 70% 16 OZ (MISCELLANEOUS) ×1 IMPLANT
APL PRP STRL LF DISP 70% ISPRP (MISCELLANEOUS) ×1
BAG COUNTER SPONGE SURGICOUNT (BAG) ×1 IMPLANT
BAG SPNG CNTER NS LX DISP (BAG) ×1
BLADE CLIPPER SURG (BLADE) IMPLANT
CHLORAPREP W/TINT 26 (MISCELLANEOUS) ×1 IMPLANT
COVER SURGICAL LIGHT HANDLE (MISCELLANEOUS) ×1 IMPLANT
CUP RINGBLOC BIPOLAR 28X54 (Orthopedic Implant) IMPLANT
DERMABOND ADVANCED .7 DNX12 (GAUZE/BANDAGES/DRESSINGS) ×2 IMPLANT
DRAPE 3/4 80X56 (DRAPES) ×3 IMPLANT
DRAPE C-ARM 42X72 X-RAY (DRAPES) ×1 IMPLANT
DRAPE STERI IOBAN 125X83 (DRAPES) ×1 IMPLANT
DRAPE U-SHAPE 47X51 STRL (DRAPES) ×3 IMPLANT
DRSG AQUACEL AG ADV 3.5X10 (GAUZE/BANDAGES/DRESSINGS) ×1 IMPLANT
ELECT BLADE 4.0 EZ CLEAN MEGAD (MISCELLANEOUS) ×1
ELECT PENCIL ROCKER SW 15FT (MISCELLANEOUS) ×1 IMPLANT
ELECT REM PT RETURN 9FT ADLT (ELECTROSURGICAL) ×1
ELECTRODE BLDE 4.0 EZ CLN MEGD (MISCELLANEOUS) ×1 IMPLANT
ELECTRODE REM PT RTRN 9FT ADLT (ELECTROSURGICAL) ×1 IMPLANT
EVACUATOR 1/8 PVC DRAIN (DRAIN) IMPLANT
GLOVE BIO SURGEON STRL SZ7.5 (GLOVE) ×1 IMPLANT
GLOVE BIO SURGEON STRL SZ8.5 (GLOVE) ×2 IMPLANT
GLOVE BIOGEL PI IND STRL 7.5 (GLOVE) ×1 IMPLANT
GLOVE BIOGEL PI IND STRL 8.5 (GLOVE) ×1 IMPLANT
GOWN STRL REUS W/ TWL XL LVL3 (GOWN DISPOSABLE) ×1 IMPLANT
GOWN STRL REUS W/TWL 2XL LVL3 (GOWN DISPOSABLE) ×1 IMPLANT
GOWN STRL REUS W/TWL XL LVL3 (GOWN DISPOSABLE) ×1
HANDPIECE INTERPULSE COAX TIP (DISPOSABLE) ×1
HEAD MOD COCR 28MM HD -3MM NK (Orthopedic Implant) IMPLANT
HOOD PEEL AWAY T7 (MISCELLANEOUS) ×2 IMPLANT
KIT BASIN OR (CUSTOM PROCEDURE TRAY) ×1 IMPLANT
KIT TURNOVER KIT B (KITS) ×1 IMPLANT
MANIFOLD NEPTUNE II (INSTRUMENTS) ×1 IMPLANT
MARKER SKIN DUAL TIP RULER LAB (MISCELLANEOUS) ×2 IMPLANT
NDL SPNL 18GX3.5 QUINCKE PK (NEEDLE) ×1 IMPLANT
NEEDLE SPNL 18GX3.5 QUINCKE PK (NEEDLE) ×1 IMPLANT
NS IRRIG 1000ML POUR BTL (IV SOLUTION) ×1 IMPLANT
PACK ANTERIOR HIP CUSTOM (KITS) ×1 IMPLANT
PACK UNIVERSAL I (CUSTOM PROCEDURE TRAY) IMPLANT
PAD ARMBOARD 7.5X6 YLW CONV (MISCELLANEOUS) ×2 IMPLANT
SAW OSC TIP CART 19.5X105X1.3 (SAW) ×1 IMPLANT
SEALER BIPOLAR AQUA 6.0 (INSTRUMENTS) IMPLANT
SET HNDPC FAN SPRY TIP SCT (DISPOSABLE) ×1 IMPLANT
SOLUTION PRONTOSAN WOUND 350ML (IRRIGATION / IRRIGATOR) ×1 IMPLANT
STAPLER VISISTAT 35W (STAPLE) IMPLANT
STEM FEM CMTLS 16X152 133D (Stem) IMPLANT
SUT MNCRL AB 3-0 PS2 18 (SUTURE) IMPLANT
SUT MON AB 2-0 CT1 36 (SUTURE) ×1 IMPLANT
SUT VIC AB 2-0 CT1 27 (SUTURE) ×1
SUT VIC AB 2-0 CT1 TAPERPNT 27 (SUTURE) ×1 IMPLANT
SUT VLOC 180 0 24IN GS25 (SUTURE) ×1 IMPLANT
SYR 50ML LL SCALE MARK (SYRINGE) ×1 IMPLANT
TOWEL GREEN STERILE (TOWEL DISPOSABLE) ×1 IMPLANT
TOWEL GREEN STERILE FF (TOWEL DISPOSABLE) ×1 IMPLANT
TRAY CATH INTERMITTENT SS 16FR (CATHETERS) IMPLANT
TRAY FOLEY W/BAG SLVR 16FR (SET/KITS/TRAYS/PACK)
TRAY FOLEY W/BAG SLVR 16FR ST (SET/KITS/TRAYS/PACK) IMPLANT
TUBE SUCTION HIGH CAP CLEAR NV (SUCTIONS) ×1 IMPLANT
WATER STERILE IRR 1000ML POUR (IV SOLUTION) ×3 IMPLANT

## 2022-12-07 NOTE — Anesthesia Preprocedure Evaluation (Addendum)
Anesthesia Evaluation  Patient identified by MRN, date of birth, ID band Patient awake    Reviewed: Allergy & Precautions, NPO status , Patient's Chart, lab work & pertinent test results  History of Anesthesia Complications Negative for: history of anesthetic complications  Airway Mallampati: III  TM Distance: >3 FB   Mouth opening: Limited Mouth Opening Comment: Previous grade I view with MAC 4, easy mask with OPA Dental  (+) Edentulous Upper, Edentulous Lower   Pulmonary neg pulmonary ROS   Pulmonary exam normal breath sounds clear to auscultation       Cardiovascular hypertension, (-) angina (-) Past MI, (-) Cardiac Stents and (-) CABG (-) dysrhythmias  Rhythm:Regular Rate:Normal  HLD  01/22/2022: IMPRESSIONS     1. Left ventricular ejection fraction, by estimation, is 60 to 65%. The  left ventricle has normal function. The left ventricle has no regional  wall motion abnormalities. Left ventricular diastolic parameters are  indeterminate.   2. Right ventricular systolic function is normal. The right ventricular  size is normal.   3. The mitral valve is normal in structure. Trivial mitral valve  regurgitation. No evidence of mitral stenosis.   4. The aortic valve is tricuspid. There is mild calcification of the  aortic valve. There is mild thickening of the aortic valve. Aortic valve  regurgitation is trivial. Aortic valve sclerosis is present, with no  evidence of aortic valve stenosis.   5. Aortic dilatation noted. There is borderline dilatation of the  ascending aorta, measuring 39 mm.     Neuro/Psych  PSYCHIATRIC DISORDERS     Dementia negative neurological ROS     GI/Hepatic negative GI ROS, Neg liver ROS,,,  Endo/Other  diabetes, Type 2, Oral Hypoglycemic Agents    Renal/GU CRFRenal disease     Musculoskeletal   Abdominal   Peds  Hematology  (+) Blood dyscrasia, anemia Lab Results      Component                 Value               Date                      WBC                      10.9 (H)            12/07/2022                HGB                      11.1 (L)            12/07/2022                HCT                      32.3 (L)            12/07/2022                MCV                      88.3                12/07/2022                PLT  263                 12/07/2022              Anesthesia Other Findings   Reproductive/Obstetrics                             Anesthesia Physical Anesthesia Plan  ASA: 3  Anesthesia Plan: General   Post-op Pain Management: Tylenol PO (pre-op)*   Induction: Intravenous  PONV Risk Score and Plan: 2 and Ondansetron, Dexamethasone, Treatment may vary due to age or medical condition, Propofol infusion and TIVA  Airway Management Planned: Oral ETT  Additional Equipment:   Intra-op Plan:   Post-operative Plan: Extubation in OR  Informed Consent: I have reviewed the patients History and Physical, chart, labs and discussed the procedure including the risks, benefits and alternatives for the proposed anesthesia with the patient or authorized representative who has indicated his/her understanding and acceptance.     Dental advisory given  Plan Discussed with: CRNA and Anesthesiologist  Anesthesia Plan Comments: (Risks of general anesthesia discussed including, but not limited to, sore throat, hoarse voice, chipped/damaged teeth, injury to vocal cords, nausea and vomiting, allergic reactions, lung infection, heart attack, stroke, and death. All questions answered. )        Anesthesia Quick Evaluation

## 2022-12-07 NOTE — Treatment Plan (Addendum)
Dr. Okey Dupre spoke to nursing about stat BMP that was scheduled for 1800 that has not yet been drawn for hyponatremia. Nursing reported that they would speak to phlebotomy.   2230: Messaged nursing x2 about the lab, not yet drawn. Call phlebotomy x2, unable to reach.   Alfredo Martinez, MD

## 2022-12-07 NOTE — Assessment & Plan Note (Addendum)
Hematoma was noted by daughter initially on 10/20 and was drained by ENT on 10/23.  Hematoma is presumed to be due to hitting the ear but is unknown. Recrudescence of hematoma present again. - Inpatient ENT consult: drainage today - Continue home Keflex 500 mg twice daily and ciprofloxacin 500 mg daily, mupirocin ointment BID per ENT recs - Monitor for infectious symptoms: pt has been afebrile and no leukocytosis - Tylenol for pain control

## 2022-12-07 NOTE — Plan of Care (Addendum)
Postop check on patient with Dr. Dolan Amen:  Awake, extraordinarily hard of hearing. Does not appear in any distress. Daughter at bedside. RN's administering medicine in applesauce.  Plan: S/p right hip hemiarthroplasty, anterior approach: Stable, comfortable. -Carb modified diet with 1 L fluid restriction reordered now -ASA 325 mg and SCD's for VTE ppx -Scheduled Tylenol every 6 hours; oxycodone IR 2.5-5 mg every 4 hours as needed for breakthrough pain and severe pain  Hyponatremia, unspecified etiology Most recent sodium 125 at 0 830 this morning - Awaiting BMP (which I made STAT), trend q12h - Awaiting serum osmolality which have asked RN to ensure gets drawn - Still pending urine studies (urine creatinine, urine sodium) -Continue 1 L fluid restriction -Continue sodium chloride tablets 1 g 3 times daily with meals

## 2022-12-07 NOTE — Assessment & Plan Note (Signed)
 Evidence of left base airspace opacity on chest x-ray, unknown if atelectasis or dressing infection/inflammation.  No evidence of oxygen desaturation or dyspnea.  Will continue to monitor.  Incentive spirometry and pulmonary toilet postoperatively.

## 2022-12-07 NOTE — Assessment & Plan Note (Addendum)
Hemoglobin appears to be at baseline. Normocytic. Ferritin 1 year ago 307.  No active bleeding, patient is out of age range for colonoscopy.

## 2022-12-07 NOTE — Anesthesia Procedure Notes (Signed)
Procedure Name: Intubation Date/Time: 12/07/2022 1:24 PM  Performed by: Alease Medina, CRNAPre-anesthesia Checklist: Patient identified, Emergency Drugs available, Suction available and Patient being monitored Patient Re-evaluated:Patient Re-evaluated prior to induction Oxygen Delivery Method: Circle system utilized Preoxygenation: Pre-oxygenation with 100% oxygen Induction Type: IV induction Ventilation: Mask ventilation without difficulty Laryngoscope Size: Mac and 4 Grade View: Grade I Tube type: Oral Tube size: 7.5 mm Number of attempts: 1 Airway Equipment and Method: Stylet and Oral airway Placement Confirmation: ETT inserted through vocal cords under direct vision, positive ETCO2 and breath sounds checked- equal and bilateral Secured at: 22 cm Tube secured with: Tape Dental Injury: Teeth and Oropharynx as per pre-operative assessment

## 2022-12-07 NOTE — Consult Note (Signed)
ENT CONSULT:  Reason for Consult: Auricular Hematoma  HPI: Paul Colon is an 89 y.o. male who ENT was consulted due to concern for re-accumulation of auricular hematoma. He was previously seen by me initially on 10/22 for this problem and then underwent I&D on 10/23. He then fell and had a hip fracture. ENT was then consulted as inpatient for reaccumulation. He continues to be on abx. Otherwise denies any ear pain, drainage, or other changes since last seen.    Past Medical History:  Diagnosis Date   Diabetes mellitus without complication (HCC)    Kidney disease    Severe dementia (HCC) 12/05/2022    Past Surgical History:  Procedure Laterality Date   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   Procedure: TOTAL HIP ARTHROPLASTY ANTERIOR APPROACH;  Surgeon: Durene Romans, MD;  Location: [facility] OR;  Service: Orthopedics;  Laterality: Left;    History reviewed. No pertinent family history.  Social History:  reports that he has never smoked. He has never used smokeless tobacco. He reports that he does not drink alcohol and does not use drugs.  Allergies:  Allergies  Allergen Reactions   Alogliptin Other (See Comments)    Unknown reaction    Penicillin G Rash    Medications: I have reviewed the patient's current medications.  Results for orders placed or performed during the hospital encounter of 12/05/22 (from the past 48 hour(s))  CBC with Differential     Status: Abnormal   Collection Time: 12/05/22  6:35 PM  Result Value Ref Range   WBC 11.5 (H) 4.0 - 10.5 K/uL   RBC 3.78 (L) 4.22 - 5.81 MIL/uL   Hemoglobin 11.5 (L) 13.0 - 17.0 g/dL   HCT 29.5 (L) 62.1 - 30.8 %   MCV 90.5 80.0 - 100.0 fL   MCH 30.4 26.0 - 34.0 pg   MCHC 33.6 30.0 - 36.0 g/dL   RDW 65.7 84.6 - 96.2 %   Platelets 290 150 - 400 K/uL   nRBC 0.0 0.0 - 0.2 %   Neutrophils Relative % 75 %   Neutro Abs 8.7 (H) 1.7 - 7.7 K/uL   Lymphocytes Relative 15 %   Lymphs Abs 1.7 0.7 - 4.0 K/uL   Monocytes Relative 8 %    Monocytes Absolute 0.9 0.1 - 1.0 K/uL   Eosinophils Relative 1 %   Eosinophils Absolute 0.1 0.0 - 0.5 K/uL   Basophils Relative 0 %   Basophils Absolute 0.1 0.0 - 0.1 K/uL   Immature Granulocytes 1 %   Abs Immature Granulocytes 0.09 (H) 0.00 - 0.07 K/uL    Comment: Performed at [facility], [address], [place], [place] [address]  Basic metabolic panel     Status: Abnormal   Collection Time: 12/05/22  6:35 PM  Result Value Ref Range   Sodium 128 (L) 135 - 145 mmol/L   Potassium 4.3 3.5 - 5.1 mmol/L   Chloride 92 (L) 98 - 111 mmol/L   CO2 25 22 - 32 mmol/L   Glucose, Bld 192 (H) 70 - 99 mg/dL    Comment: Glucose reference range applies only to samples taken after fasting for at least 8 hours.   BUN 28 (H) 8 - 23 mg/dL   Creatinine, Ser 1.32 0.61 - 1.24 mg/dL   Calcium 8.7 (L) 8.9 - 10.3 mg/dL   GFR, Estimated >44 >01 mL/min    Comment: (NOTE) Calculated using the CKD-EPI Creatinine Equation (2021)    Anion gap 11 5 - 15  Comment: Performed at [facility], [address] [place], [place] [address]  Glucose, capillary     Status: Abnormal   Collection Time: 12/05/22 10:26 PM  Result Value Ref Range   Glucose-Capillary 283 (H) 70 - 99 mg/dL    Comment: Glucose reference range applies only to samples taken after fasting for at least 8 hours.  Glucose, capillary     Status: Abnormal   Collection Time: 12/06/22  3:06 AM  Result Value Ref Range   Glucose-Capillary 328 (H) 70 - 99 mg/dL    Comment: Glucose reference range applies only to samples taken after fasting for at least 8 hours.  Glucose, capillary     Status: Abnormal   Collection Time: 12/06/22  5:37 AM  Result Value Ref Range   Glucose-Capillary 212 (H) 70 - 99 mg/dL    Comment: Glucose reference range applies only to samples taken after fasting for at least 8 hours.  Glucose, capillary     Status: Abnormal   Collection Time: 12/06/22  7:42 AM  Result Value Ref Range   Glucose-Capillary 196 (H) 70 -  99 mg/dL    Comment: Glucose reference range applies only to samples taken after fasting for at least 8 hours.  CBC     Status: Abnormal   Collection Time: 12/06/22  8:14 AM  Result Value Ref Range   WBC 10.3 4.0 - 10.5 K/uL   RBC 3.59 (L) 4.22 - 5.81 MIL/uL   Hemoglobin 11.2 (L) 13.0 - 17.0 g/dL   HCT 82.9 (L) 56.2 - 13.0 %   MCV 88.3 80.0 - 100.0 fL   MCH 31.2 26.0 - 34.0 pg   MCHC 35.3 30.0 - 36.0 g/dL   RDW 86.5 78.4 - 69.6 %   Platelets 292 150 - 400 K/uL   nRBC 0.0 0.0 - 0.2 %    Comment: Performed at [facility], [address] [place], [place] [address]  Vitamin B12     Status: None   Collection Time: 12/06/22  8:14 AM  Result Value Ref Range   Vitamin B-12 530 180 - 914 pg/mL    Comment: (NOTE) This assay is not validated for testing neonatal or myeloproliferative syndrome specimens for Vitamin B12 levels. Performed at [facility], [address] [place], [place] [address]   Folate     Status: None   Collection Time: 12/06/22  8:14 AM  Result Value Ref Range   Folate 18.4 >5.9 ng/mL    Comment: Performed at [facility], [address], [place], [place] [address]  Hemoglobin A1c     Status: Abnormal   Collection Time: 12/06/22  8:14 AM  Result Value Ref Range   Hgb A1c MFr Bld 8.4 (H) 4.8 - 5.6 %    Comment: (NOTE) Pre diabetes:          5.7%-6.4%  Diabetes:              >6.4%  Glycemic control for   <7.0% adults with diabetes    Mean Plasma Glucose 194.38 mg/dL    Comment: Performed at [facility], [address], [place], [place] [address]  Basic metabolic panel     Status: Abnormal   Collection Time: 12/06/22  8:18 AM  Result Value Ref Range   Sodium 128 (L) 135 - 145 mmol/L   Potassium 4.0 3.5 - 5.1 mmol/L   Chloride 90 (L) 98 - 111 mmol/L   CO2 26  22 - 32 mmol/L   Glucose, Bld 204 (H) 70 - 99 mg/dL    Comment: Glucose reference range applies only to samples taken after fasting for at least 8 hours.   BUN 26 (H) 8 - 23  mg/dL   Creatinine, Ser 3.08 0.61 - 1.24 mg/dL   Calcium 9.0 8.9 - 65.7 mg/dL   GFR, Estimated >84 >69 mL/min    Comment: (NOTE) Calculated using the CKD-EPI Creatinine Equation (2021)    Anion gap 12 5 - 15    Comment: Performed at [facility], [address], [place], [place] [address]  Glucose, capillary     Status: Abnormal   Collection Time: 12/06/22 11:34 AM  Result Value Ref Range   Glucose-Capillary 158 (H) 70 - 99 mg/dL    Comment: Glucose reference range applies only to samples taken after fasting for at least 8 hours.  Glucose, capillary     Status: Abnormal   Collection Time: 12/06/22  4:21 PM  Result Value Ref Range   Glucose-Capillary 190 (H) 70 - 99 mg/dL    Comment: Glucose reference range applies only to samples taken after fasting for at least 8 hours.  Surgical pcr screen     Status: None   Collection Time: 12/06/22  4:24 PM   Specimen: Nasal Mucosa; Nasal Swab  Result Value Ref Range   MRSA, PCR NEGATIVE NEGATIVE   Staphylococcus aureus NEGATIVE NEGATIVE    Comment: (NOTE) The Xpert SA Assay (FDA approved for NASAL specimens in patients 51 years of age and older), is one component of a comprehensive surveillance program. It is not intended to diagnose infection nor to guide or monitor treatment. Performed at [facility], [address] [place], [place] [address]   Type and screen [facility]     Status: None   Collection Time: 12/06/22  5:58 PM  Result Value Ref Range   ABO/RH(D) O POS    Antibody Screen NEG    Sample Expiration      12/09/2022,2359 Performed at [facility], [address], [place], [place] [address]   Basic metabolic panel     Status: Abnormal   Collection Time: 12/06/22  6:01 PM  Result Value Ref Range   Sodium 127 (L) 135 - 145 mmol/L   Potassium 4.3 3.5 - 5.1 mmol/L   Chloride 93 (L) 98 - 111 mmol/L   CO2 25 22 - 32 mmol/L   Glucose, Bld 186 (H) 70 - 99 mg/dL    Comment: Glucose reference  range applies only to samples taken after fasting for at least 8 hours.   BUN 24 (H) 8 - 23 mg/dL   Creatinine, Ser 2.72 0.61 - 1.24 mg/dL   Calcium 8.8 (L) 8.9 - 10.3 mg/dL   GFR, Estimated >53 >66 mL/min    Comment: (NOTE) Calculated using the CKD-EPI Creatinine Equation (2021)    Anion gap 9 5 - 15    Comment: Performed at [facility], [address], [place], [place] [address]  Glucose, capillary     Status: Abnormal   Collection Time: 12/06/22  9:53 PM  Result Value Ref Range   Glucose-Capillary 339 (H) 70 - 99 mg/dL    Comment: Glucose reference range applies only to samples taken after fasting for at least 8 hours.  Glucose, capillary     Status: Abnormal   Collection Time: 12/06/22 11:44 PM  Result Value Ref Range   Glucose-Capillary 311 (  H) 70 - 99 mg/dL    Comment: Glucose reference range applies only to samples taken after fasting for at least 8 hours.  Glucose, capillary     Status: Abnormal   Collection Time: 12/07/22  1:55 AM  Result Value Ref Range   Glucose-Capillary 148 (H) 70 - 99 mg/dL    Comment: Glucose reference range applies only to samples taken after fasting for at least 8 hours.   Comment 1 Notify RN   Glucose, capillary     Status: Abnormal   Collection Time: 12/07/22  8:07 AM  Result Value Ref Range   Glucose-Capillary 151 (H) 70 - 99 mg/dL    Comment: Glucose reference range applies only to samples taken after fasting for at least 8 hours.  CBC     Status: Abnormal   Collection Time: 12/07/22  8:30 AM  Result Value Ref Range   WBC 10.9 (H) 4.0 - 10.5 K/uL   RBC 3.66 (L) 4.22 - 5.81 MIL/uL   Hemoglobin 11.1 (L) 13.0 - 17.0 g/dL   HCT 53.6 (L) 64.4 - 03.4 %   MCV 88.3 80.0 - 100.0 fL   MCH 30.3 26.0 - 34.0 pg   MCHC 34.4 30.0 - 36.0 g/dL   RDW 74.2 59.5 - 63.8 %   Platelets 263 150 - 400 K/uL   nRBC 0.0 0.0 - 0.2 %    Comment: Performed at [facility], [address] [place], [place] [address]  Basic metabolic panel     Status:  Abnormal   Collection Time: 12/07/22  8:30 AM  Result Value Ref Range   Sodium 125 (L) 135 - 145 mmol/L   Potassium 4.0 3.5 - 5.1 mmol/L   Chloride 92 (L) 98 - 111 mmol/L   CO2 22 22 - 32 mmol/L   Glucose, Bld 183 (H) 70 - 99 mg/dL    Comment: Glucose reference range applies only to samples taken after fasting for at least 8 hours.   BUN 21 8 - 23 mg/dL   Creatinine, Ser 3.29 0.61 - 1.24 mg/dL   Calcium 8.3 (L) 8.9 - 10.3 mg/dL   GFR, Estimated 56 (L) >60 mL/min    Comment: (NOTE) Calculated using the CKD-EPI Creatinine Equation (2021)    Anion gap 11 5 - 15    Comment: Performed at [facility], [address], [place], [place] [address]  Glucose, capillary     Status: Abnormal   Collection Time: 12/07/22 11:16 AM  Result Value Ref Range   Glucose-Capillary 189 (H) 70 - 99 mg/dL    Comment: Glucose reference range applies only to samples taken after fasting for at least 8 hours.    DG Knee Right Port  Result Date: 12/06/2022 CLINICAL DATA:  Closed displaced fracture right femoral neck. EXAM: PORTABLE RIGHT KNEE - 1-2 VIEW COMPARISON:  None Available. FINDINGS: Mild tricompartmental joint space narrowing. Minimal inferior patellar degenerative spurring. Minimal lateral compartment chondrocalcinosis. No joint effusion. No acute fracture or dislocation. There is a mildly lobular predominantly sclerotic lesion with narrow zone of transition overlying the slightly lateral aspect of the distal femoral diaphysis on frontal view, measuring up to approximately 1.9 x 2.7 cm (transverse by AP). On lateral view this has a more lucent center with thin sclerotic well-circumscribed border and measures up to approximately 1.8 cm in AP dimension, abutting the posterior cortex. 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 IMPRESSION: 1. Mild tricompartmental osteoarthritis. 2. No  acute fracture. 3. 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 Electronically Signed   By: Neita Garnet M.D.   On: 12/06/2022 09:55   CT HEAD WO CONTRAST ( )  Result Date: 12/05/2022 CLINICAL DATA:  Head trauma, minor (Age >= 65y) fall Pt bib ems for a fall. Pt was getting out of the car, lost his balance and fell onto his right hip, no LOC, did not hit his head, no blood thinners. EXAM: CT HEAD WITHOUT CONTRAST TECHNIQUE: Contiguous axial images were obtained from the base of the skull through the vertex without intravenous contrast. RADIATION DOSE REDUCTION: This exam was performed according to the departmental dose-optimization program which includes automated exposure control, adjustment of the mA and/or kV according to patient size and/or use of iterative reconstruction technique. COMPARISON:  CT head 08/24/2022 FINDINGS: Brain: Cerebral ventricle sizes are concordant with the degree of cerebral volume loss. Patchy and confluent areas of decreased attenuation are noted throughout the deep and periventricular white matter of the cerebral hemispheres bilaterally, compatible with chronic microvascular ischemic disease. No evidence of large-territorial acute infarction. No parenchymal hemorrhage. No mass lesion. No extra-axial collection. No mass effect or midline shift. No hydrocephalus. Basilar cisterns are patent. Vascular: No hyperdense vessel. Atherosclerotic calcifications are present within the cavernous internal carotid arteries. Skull: No acute fracture or focal lesion. Sinuses/Orbits: Paranasal sinuses and mastoid air cells are clear. Right lens replacement. The orbits are unremarkable. Other: None. Electronically Signed   By: Tish Frederickson M.D.   On: 12/05/2022 19:50   DG Chest 2 View  Result Date: 12/05/2022 CLINICAL DATA:  fall, R hip injury EXAM: CHEST - 2 VIEW COMPARISON:  Chest x-ray 10/06/2022 FINDINGS: The heart and mediastinal contours are unchanged. Atherosclerotic plaque. Vague left base airspace opacity. No  pulmonary edema. No pleural effusion. No pneumothorax. No acute osseous abnormality. IMPRESSION: 1. Vague left base airspace opacity. Finding may represent atelectasis versus developing infection/inflammation. 2.  Aortic Atherosclerosis (ICD10-I70.0). Electronically Signed   By: Tish Frederickson M.D.   On: 12/05/2022 19:48   DG Hip Unilat W or Wo Pelvis 2-3 Views Right  Result Date: 12/05/2022 CLINICAL DATA:  fall, R hip injury EXAM: DG HIP (WITH OR WITHOUT PELVIS) 2-3V RIGHT COMPARISON:  X-ray left hip 01/20/2022 FINDINGS: Total left hip arthroplasty. No radiographic findings to suggest surgical hardware complication. Acute displaced right femoral neck fracture. No dislocation of either hips. No acute displaced fracture or diastasis of the bones of the pelvis. There is no evidence of arthropathy or other focal bone abnormality. IMPRESSION: Acute displaced right femoral neck fracture. Electronically Signed   By: Tish Frederickson M.D.   On: 12/05/2022 19:46    ROS:10 pt ROS otherwise negative except hip pain  Blood pressure (!) 156/78, pulse 72, temperature 98.1 F (36.7 C), temperature source Oral, resp. rate 18, height 5\' 11"  (1.803 m), weight 66.2 kg, SpO2 94%.  PHYSICAL EXAM:  CONSTITUTIONAL: well developed, but frail; lying in bed; no distress and alert and oriented to self only CARDIOVASCULAR: normal rate and regular rhythm PULMONARY/CHEST WALL: effort normal and no stridor, no stertor, no dysphonia HENT: Head : normocephalic and atraumatic Ears: Right ear: Right auricle with reaccumulation superiorly of hematoma in small area where bolster is not present. Otherwise no evidence of hematoma. Canal wnl; cellulitis surrounding spares some of the lobule; no postauricular fluctuance; unable to visualize left TM or EAC due to swelling/fluctuance Left ear:   canal normal, external ear normal, some cerumen Nose: nose  normal and no purulence Mouth/Throat:  Mouth: uvula midline and no oral  lesions Throat: oropharynx clear and moist Mucous membranes: normal EYES: conjunctiva normal, EOM normal and PERRL NECK: supple, trachea normal and no thyromegaly or cervical LAD  Studies Reviewed: Medicine notes reviewed; CT Head 10/23: mastoids and ME well aerated; bolster in place,some gas but no auricular hematoma reaccumulation noted then  Assessment/Plan: Mr. Gallihugh is a 89 y.o. male with reaccumulation of left auricular hematoma with likely cellulitis surrounding.  Left auricular hematoma Left auricular cellulitis This was re-drained today and bolster placed where reaccumulated - Continue PO ciprofloxacin and keflex 500mg  BID x7d - Primary team reports Ortho planning to start anticoagulation after hip surgery today, therefore will leave bolster in place - Mupirocin 2% ointment to auricle BID - Avoid getting bolster wet or putting pressure on the ear - don't wear hearing aid on that side until bolster removed - ENT will continue to follow  I have personally spent 80 minutes involved in face-to-face and non-face-to-face activities for this patient on the day of the visit.  Professional time spent includes the following activities, in addition to those noted in the documentation: preparing to see the patient (review of outside documentation and results - CT), performing a medically appropriate examination and/or evaluation, counseling and educating the patient/family/caregiver, ordering medications, performing procedures (drainage of hematoma), referring and communicating with other healthcare professionals, documenting clinical information in the electronic or other health record, independently interpreting results and communicating results with the patient/family/caregiver (daughter).   12/07/2022, 1:19 PM

## 2022-12-07 NOTE — Discharge Instructions (Signed)
Dr. Rod Can Joint Replacement Specialist [facility] [address], [place], [place] [address] [phone]   HIP REPLACEMENT POSTOPERATIVE DIRECTIONS    Hip Rehabilitation, Guidelines Following Surgery   WEIGHT BEARING Weight bearing as tolerated with assist device (walker, cane, etc) as directed, use it as long as suggested by your surgeon or therapist, typically at least 4-6 weeks.  The results of a hip operation are greatly improved after range of motion and muscle strengthening exercises. Follow all safety measures which are given to protect your hip. If any of these exercises cause increased pain or swelling in your joint, decrease the amount until you are comfortable again. Then slowly increase the exercises. Call your caregiver if you have problems or questions.   HOME CARE INSTRUCTIONS  Most of the following instructions are designed to prevent the dislocation of your new hip.  Remove items at home which could result in a fall. This includes throw rugs or furniture in walking pathways.  Continue medications as instructed at time of discharge. You may have some home medications which will be placed on hold until you complete the course of blood thinner medication. You may start showering once you are discharged home. Do not remove your dressing. Do not put on socks or shoes without following the instructions of your caregivers.   Sit on chairs with arms. Use the chair arms to help push yourself up when arising.  Arrange for the use of a toilet seat elevator so you are not sitting low.  Walk with walker as instructed.  You may resume a sexual relationship in one month or when given the OK by your caregiver.  Use walker as long as suggested by your caregivers.  You may put full weight on your legs and walk as much as is comfortable. Avoid periods of inactivity such as sitting longer than an hour when not asleep. This helps prevent blood clots.   You may return to work once you are cleared by Engineer, production.  Do not drive a car for 6 weeks or until released by your surgeon.  Do not drive while taking narcotics.  Wear elastic stockings for two weeks following surgery during the day but you may remove then at night.  Make sure you keep all of your appointments after your operation with all of your doctors and caregivers. You should call the office at the above phone number and make an appointment for approximately two weeks after the date of your surgery. Please pick up a stool softener and laxative for home use as long as you are requiring pain medications. ICE to the affected hip every three hours for 30 minutes at a time and then as needed for pain and swelling. Continue to use ice on the hip for pain and swelling from surgery. You may notice swelling that will progress down to the foot and ankle.  This is normal after surgery.  Elevate the leg when you are not up walking on it.   It is important for you to complete the blood thinner medication as prescribed by your doctor. Continue to use the breathing machine which will help keep your temperature down.  It is common for your temperature to cycle up and down following surgery, especially at night when you are not up moving around and exerting yourself.  The breathing machine keeps your lungs expanded and your temperature down.  RANGE OF MOTION AND STRENGTHENING EXERCISES  These exercises are designed to help you keep  full movement of your hip joint. Follow your caregiver's or physical therapist's instructions. Perform all exercises about fifteen times, three times per day or as directed. Exercise both hips, even if you have had only one joint replacement. These exercises can be done on a training (exercise) mat, on the floor, on a table or on a bed. Use whatever works the best and is most comfortable for you. Use music or television while you are exercising so that the exercises are a pleasant  break in your day. This will make your life better with the exercises acting as a break in routine you can look forward to.  Lying on your back, slowly slide your foot toward your buttocks, raising your knee up off the floor. Then slowly slide your foot back down until your leg is straight again.  Lying on your back spread your legs as far apart as you can without causing discomfort.  Lying on your side, raise your upper leg and foot straight up from the floor as far as is comfortable. Slowly lower the leg and repeat.  Lying on your back, tighten up the muscle in the front of your thigh (quadriceps muscles). You can do this by keeping your leg straight and trying to raise your heel off the floor. This helps strengthen the largest muscle supporting your knee.  Lying on your back, tighten up the muscles of your buttocks both with the legs straight and with the knee bent at a comfortable angle while keeping your heel on the floor.   SKILLED REHAB INSTRUCTIONS: If the patient is transferred to a skilled rehab facility following release from the hospital, a list of the current medications will be sent to the facility for the patient to continue.  When discharged from the skilled rehab facility, please have the facility set up the patient's [place] prior to being released. Also, the skilled facility will be responsible for providing the patient with their medications at time of release from the facility to include their pain medication and their blood thinner medication. If the patient is still at the rehab facility at time of the two week follow up appointment, the skilled rehab facility will also need to assist the patient in arranging follow up appointment in our office and any transportation needs.  POST-OPERATIVE OPIOID TAPER INSTRUCTIONS: It is important to wean off of your opioid medication as soon as possible. If you do not need pain medication after your surgery it is ok to stop  day one. Opioids include: Codeine, Hydrocodone(Norco, Vicodin), Oxycodone(Percocet, oxycontin) and hydromorphone amongst others.  Long term and even short term use of opiods can cause: Increased pain response Dependence Constipation Depression Respiratory depression And more.  Withdrawal symptoms can include Flu like symptoms Nausea, vomiting And more Techniques to manage these symptoms Hydrate well Eat regular healthy meals Stay active Use relaxation techniques(deep breathing, meditating, yoga) Do Not substitute Alcohol to help with tapering If you have been on opioids for less than two weeks and do not have pain than it is ok to stop all together.  Plan to wean off of opioids This plan should start within one week post op of your joint replacement. Maintain the same interval or time between taking each dose and first decrease the dose.  Cut the total daily intake of opioids by one tablet each day Next start to increase the time between doses. The last dose that should be eliminated is the evening dose.    MAKE SURE  YOU:  Understand these instructions.  Will watch your condition.  Will get help right away if you are not doing well or get worse.  Pick up stool softner and laxative for home use following surgery while on pain medications. Do not remove your dressing. The dressing is waterproof--it is OK to take showers. Continue to use ice for pain and swelling after surgery. Do not use any lotions or creams on the incision until instructed by your surgeon. Total Hip Protocol.

## 2022-12-07 NOTE — Progress Notes (Signed)
Daily Progress Note Intern Pager: [phone]  Patient name: Paul Colon Medical record number: [id number] Date of birth: 1930-02-02 Age: 89 y.o. Gender: male  Primary Care Provider: [facility] Consultants: Ortho Code Status: Full code Preferred Emergency Contact: Dia Crawford [phone])   Chief Complaint: Right hip fracture   Assessment and Plan: Paul Colon is a 89 y.o. male presenting with right hip fracture from fall and secondarily, left ear hematoma.  Assessment & Plan Hip fracture (HCC) From fall. Hip x-ray: Acute displaced right femoral neck fracture.  Patient in no pain at rest.  - Orthopedic surgery consulted, appreciate recs: right hip hemiarthroplasty today. NPO, hold chemical DVT ppx.  - PRN Tylenol 650 every 6 hours for mild pain, will order as needed for moderate pain if indicated - AM CBC, BMP - PT/OT post-op - Fall and delirium precautions Hematoma of left auricular region Hematoma was noted by daughter initially on 10/20 and was drained by ENT on 10/23.  Hematoma is presumed to be due to hitting the ear but is unknown. Recrudescence of hematoma present again. - Inpatient ENT consult: drainage today - Continue home Keflex 500 mg twice daily and ciprofloxacin 500 mg daily, mupirocin ointment BID per ENT recs - Monitor for infectious symptoms: pt has been afebrile and no leukocytosis - Tylenol for pain control Severe dementia (HCC) Per daughter, progressing over past several years, likely age-related cognitive decline with chronic ischemic changes on CT.  TSH 8/24: 2.3 WNL.  CT head: No ventriculomegaly or acute findings. - Ordered reversible causes of dementia: B12 and folate WNL. -resume multivitamin   - Delirium and fall precautions - Palliative consult for GOC  Hyponatremia Na 125. Chronic hyponatremia around 130 at baseline. Discharged August 2024 with nephrology recommendations of fluid restriction of 1L/day and salt tablets 1 g TID.  Prior labs suggest SIADH. - Continue salt tabs and fluid restriction as above - daily BMPs but also repeat this afternoon - nephrology recommended workup: will obtain Una, Uosm, and serum osm Anemia Hemoglobin appears to be at baseline. Normocytic. Ferritin 1 year ago 307.  No active bleeding, patient is out of age range for colonoscopy. Chest x-ray abnormality Evidence of left base airspace opacity on chest x-ray, unknown if atelectasis or dressing infection/inflammation.  No evidence of oxygen desaturation or dyspnea.  Will continue to monitor.  Incentive spirometry and pulmonary toilet postoperatively.  Chronic and Stable Problems:  Chronic kidney disease stage IIIa: Creatinine stable DM2: A1c 8.4. CBGs 4 times daily with SSI. Hold metformin for now.  HLD: Continue statin BPH: continue finasteride Cataracts: Stopped Cosopt. Continue timolol, difluprednate, and muro.   FEN/GI: Regular diet>NPO at MN VTE Prophylaxis: SCD as tolerated   Dispo: pending clinical improvement  Subjective:  Pt reports no complaints. Daughter [name] at bedside.   Objective: Temp:  [97.6 F (36.4 C)-98.8 F (37.1 C)] 97.6 F (36.4 C) (10/25 0548) Pulse Rate:  [72-83] 82 (10/25 0548) Resp:  [18-19] 18 (10/25 0548) BP: (118-144)/(69-96) 144/69 (10/25 0548) SpO2:  [91 %-94 %] 91 % (10/25 0548) Physical Exam: General: Daughter, [name], at bedside. Pt awake, hard of hearing, laying comfortably in bed, NAD.  ENT: left ear with significant hematoma and dressing. Looks similar to picture previous to recent drainage shown in H&P note.  Cardiovascular: RRR   Respiratory: CTA in superior anterior ribs Extremities: Right hip externally rotated, small abrasion to laterally. Warm lower extremities with no edema. No ecchymosis or open lesions to the right hip. Movies toes  and feet.   Laboratory: Most recent CBC Lab Results  Component Value Date   WBC 10.3 12/06/2022   HGB 11.2 (L) 12/06/2022   HCT 31.7 (L)  12/06/2022   MCV 88.3 12/06/2022   PLT 292 12/06/2022   Most recent BMP    Latest Ref Rng & Units 12/06/2022    6:01 PM  BMP  Glucose 70 - 99 mg/dL 811   BUN 8 - 23 mg/dL 24   Creatinine 9.14 - 1.24 mg/dL 7.82   Sodium 956 - 213 mmol/L 127   Potassium 3.5 - 5.1 mmol/L 4.3   Chloride 98 - 111 mmol/L 93   CO2 22 - 32 mmol/L 25   Calcium 8.9 - 10.3 mg/dL 8.8    Y8M 8.4.   Ronney Lion, Medical Student 12/07/2022, 7:50 AM  [facility] FPTS Intern pager: [phone], text pages welcome Secure chat group [facility] Teaching Service

## 2022-12-07 NOTE — Assessment & Plan Note (Signed)
From fall. Hip x-ray: Acute displaced right femoral neck fracture.  Patient in no pain at rest.  - Orthopedic surgery consulted, appreciate recs: right hip hemiarthroplasty today. NPO, hold chemical DVT ppx.  - PRN Tylenol 650 every 6 hours for mild pain, will order as needed for moderate pain if indicated - AM CBC, BMP - PT/OT post-op - Fall and delirium precautions

## 2022-12-07 NOTE — Progress Notes (Incomplete)
Daily Progress Note Intern Pager: [phone]  Patient name: Paul Colon Medical record number: [id number] Date of birth: December 03, 1929 Age: 89 y.o. Gender: male  Primary Care Provider: [facility] Consultants: Orthopedic surgery, ENT Code Status: Full  Pt Overview and Major Events to Date:   Paul Colon is a 89 year old male who presented with a right hip fracture status post fixation 10/24 and left ear hematoma status post drainage. Assessment & Plan Hip fracture (HCC) From fall. Hip x-ray: Acute displaced right femoral neck fracture.  Received right hip hemiarthroplasty 10/24.  Patient appears comfortable *** on interview. - Orthopedic surgery consulted, appreciate recs: right hip hemiarthroplasty today. NPO, hold chemical DVT ppx.  - PRN Tylenol 650 every 6 hours for mild pain, oxycodone 2.5 to 5 mg every 4 hours as needed for breakthrough/severe pain - AM CBC, BMP - PT/OT post-op - Fall and delirium precautions Hematoma of left auricular region ENT drained 10/24, evacuated lots of blood/clot.  Bolster placed. - Continue home Keflex 500 mg twice daily and ciprofloxacin 500 mg daily, mupirocin ointment BID per ENT recs - Monitor for infectious symptoms: pt has been afebrile and no leukocytosis - Tylenol for pain control Severe dementia (HCC) Per daughter, progressing over past several years, likely age-related cognitive decline with chronic ischemic changes on CT.  TSH 8/24: 2.3 WNL.  CT head: No ventriculomegaly or acute findings.  B12 and folate within normal limits. Patient daughter refused palliative care conversation 10/24 - Delirium and fall precautions Hyponatremia Na 125.  *** Chronic hyponatremia around 130 at baseline. Discharged August 2024 with nephrology recommendations of fluid restriction of 1L/day and salt tablets 1 g TID. Prior labs suggest SIADH. - Continue salt tabs and fluid restriction as above - BMP every 12 hours - Nephrology recommended workup:  Awaiting Una, Uosm, and serum osm Anemia Hemoglobin appears to be at baseline. Normocytic. Ferritin 1 year ago 307.  No active bleeding, patient is out of age range for colonoscopy. Chest x-ray abnormality Evidence of left base airspace opacity on chest x-ray, unknown if atelectasis or dressing infection/inflammation.  No evidence of oxygen desaturation or dyspnea.  Will continue to monitor.  Incentive spirometry and pulmonary toilet postoperatively.  Chronic and Stable Issues:  Chronic kidney disease stage IIIa: Creatinine stable DM2: A1c 8.4. CBGs 4 times daily with SSI. Hold metformin for now.  HLD: Continue statin BPH: continue finasteride Cataracts: Stopped Cosopt. Continue timolol, difluprednate, and muro.  FEN/GI: Carb modified diet PPx: ASA/SCDs Dispo: Pending clinical improvement  Subjective:   On interview, ***.   Objective:  BP: *** HR: *** RR: *** T: *** O2sat: ***  Significant vitals over past 24 hours:   Physical Exam:  General: Daughter, Paul Colon, at bedside. Pt awake, hard of hearing, laying comfortably in bed, NAD.  *** ENT: left ear with significant hematoma and dressing. Looks similar to picture previous to recent drainage shown in H&P note.  Cardiovascular: RRR   Respiratory: CTA in superior anterior ribs Extremities: Right hip externally rotated, small abrasion to laterally. Warm lower extremities with no edema. No ecchymosis or open lesions to the right hip. Movies toes and feet.   Basic labs:  Most recent CBC Lab Results  Component Value Date   WBC 10.9 (H) 12/07/2022   HGB 11.1 (L) 12/07/2022   HCT 32.3 (L) 12/07/2022   MCV 88.3 12/07/2022   PLT 263 12/07/2022   Most recent BMP    Latest Ref Rng & Units 12/07/2022    8:30 AM  BMP  Glucose 70 - 99 mg/dL 161   BUN 8 - 23 mg/dL 21   Creatinine 0.96 - 1.24 mg/dL 0.45   Sodium 409 - 811 mmol/L 125   Potassium 3.5 - 5.1 mmol/L 4.0   Chloride 98 - 111 mmol/L 92   CO2 22 - 32 mmol/L 22   Calcium  8.9 - 10.3 mg/dL 8.3     Other pertinent labs:  ***  Imaging/Diagnostic Tests:  Radiologist Impression: *** My interpretation: Tomie China, MD 12/07/2022, 9:42 PM  PGY-1, [facility] FPTS Intern pager: [phone], text pages welcome Secure chat group [facility] Teaching Service

## 2022-12-07 NOTE — Interval H&P Note (Signed)
History and Physical Interval Note:  12/07/2022 12:36 PM  Ehren Renteria  has presented today for surgery, with the diagnosis of RIGHT HIP FRACTURE.  The various methods of treatment have been discussed with the patient and family. After consideration of risks, benefits and other options for treatment, the patient has consented to  Procedure(s): ANTERIOR APPROACH HEMI HIP ARTHROPLASTY (Right) as a surgical intervention.  The patient's history has been reviewed, patient examined, no change in status, stable for surgery.  I have reviewed the patient's chart and labs.  Questions were answered to the patient's satisfaction.    The risks, benefits, and alternatives were discussed with the patient. There are risks associated with the surgery including, but not limited to, problems with anesthesia (death), infection, instability (giving out of the joint), dislocation, differences in leg length/angulation/rotation, fracture of bones, loosening or failure of implants, hematoma (blood accumulation) which may require surgical drainage, blood clots, pulmonary embolism, nerve injury (foot drop and lateral thigh numbness), and blood vessel injury. The patient understands these risks and elects to proceed.    Iline Oven Bethan Adamek

## 2022-12-07 NOTE — Assessment & Plan Note (Signed)
Hemoglobin appears to be at baseline. Normocytic. Ferritin 1 year ago 307.  No active bleeding, patient is out of age range for colonoscopy.

## 2022-12-07 NOTE — Anesthesia Postprocedure Evaluation (Signed)
Anesthesia Post Note  Patient: Trillion Renicker  Procedure(s) Performed: ANTERIOR APPROACH HEMI HIP ARTHROPLASTY (Right: Hip)     Patient location during evaluation: PACU Anesthesia Type: General Level of consciousness: awake Pain management: pain level controlled Vital Signs Assessment: post-procedure vital signs reviewed and stable Respiratory status: spontaneous breathing, nonlabored ventilation and respiratory function stable Cardiovascular status: blood pressure returned to baseline and stable Postop Assessment: no apparent nausea or vomiting Anesthetic complications: no   No notable events documented.  Last Vitals:  Vitals:   12/07/22 1530 12/07/22 1536  BP: (!) 173/68 (!) 130/95  Pulse: 64 64  Resp: 16 16  Temp:  36.6 C  SpO2: 98% 97%    Last Pain:  Vitals:   12/07/22 1512  TempSrc:   PainSc: Asleep                 Linton Rump

## 2022-12-07 NOTE — Transfer of Care (Signed)
Immediate Anesthesia Transfer of Care Note  Patient: Paul Colon  Procedure(s) Performed: ANTERIOR APPROACH HEMI HIP ARTHROPLASTY (Right: Hip)  Patient Location: PACU  Anesthesia Type:General  Level of Consciousness: awake and drowsy  Airway & Oxygen Therapy: Patient Spontanous Breathing  Post-op Assessment: Report given to RN and Post -op Vital signs reviewed and stable  Post vital signs: Reviewed and stable  Last Vitals:  Vitals Value Taken Time  BP 139/63 12/07/22 1515  Temp 36.6 C 12/07/22 1512  Pulse 66 12/07/22 1518  Resp 18 12/07/22 1518  SpO2 92 % 12/07/22 1518  Vitals shown include unfiled device data.  Last Pain:  Vitals:   12/07/22 1512  TempSrc:   PainSc: Asleep         Complications: No notable events documented.

## 2022-12-07 NOTE — Op Note (Signed)
OPERATIVE REPORT  SURGEON: Samson Frederic, MD   ASSISTANT: Clint Bolder, PA-C  PREOPERATIVE DIAGNOSIS: Displaced Right femoral neck fracture.   POSTOPERATIVE DIAGNOSIS: Displaced Right femoral neck fracture.   PROCEDURE: Right hip hemiarthroplasty, anterior approach.   IMPLANTS: Biomet Taperloc Complete Reduced Distal stem, size 16 x 152 mm, high offset, with a 28 - 3 mm metal head ball and a 54 mm bipolar head ball.  ANESTHESIA:  General  ANTIBIOTICS: 2g ancef.  ESTIMATED BLOOD LOSS:-200 mL    DRAINS: None.  COMPLICATIONS: None   CONDITION: PACU - hemodynamically stable.   BRIEF CLINICAL NOTE: Paul Colon is a 89 y.o. male with a displaced Right femoral neck fracture. The patient was admitted to the hospitalist service and underwent perioperative risk stratification and medical optimization. The risks, benefits, and alternatives to hemiarthroplasty were explained, and the patient elected to proceed.  PROCEDURE IN DETAIL: The patient was taken to the operating room and general anesthesia was induced on the hospital bed.  The patient was then positioned on the Hana table.  All bony prominences were well padded.  The hip was prepped and draped in the normal sterile surgical fashion.  A time-out was called verifying side and site of surgery. Antibiotics were given within 60 minutes of beginning the procedure.   Bikini incision was made, and the direct anterior approach to the hip was performed through the Hueter interval.  Superficial dissection was carried out lateral to the ASIS. Lateral femoral circumflex vessels were treated with the Auqumantys. The anterior capsule was exposed and an inverted T capsulotomy was made. Fracture hematoma was encountered and evacuated. The patient was found to have a comminuted Right subcapital femoral neck fracture.  Inferior pubofemoral ligament was released subperiosteally to the lesser trochanter. I freshened the femoral neck cut with a saw.  I removed  the femoral neck fragment.  A corkscrew was placed into the head and the head was removed.  This was passed to the back table and was measured.   Acetabular exposure was achieved.  I examined the articular cartilage which was intact.  The labrum was intact. A 54 mm trial head was placed and found to have excellent fit.   I then gained femoral exposure taking care to protect the abductors and greater trochanter.  The superior capsule was incised longitudinally, staying lateral to the posterior border of the femoral neck. External rotation, extension, and adduction were applied.  A cookie cutter was used to enter the femoral canal, and then the femoral canal finder was used to confirm location.  I then sequentially broached up to a size 16.  Calcar planer was used on the femoral neck remnant.  I placed a high offset neck and a 54 mm bipolar trial head ball. The hip was reduced.  Leg lengths were checked fluoroscopically.  The hip was dislocated and trial components were removed.  I placed the real bipolar construct.  A single reduction maneuver was performed and the hip was reduced.  Fluoroscopy was used to confirm component position and leg lengths.  At 90 degrees of external rotation and extension, the hip was stable to an anterior directed force.   The wound was copiously irrigated with Irrisept solution and normal saline using pulse lavage.  Marcaine solution was injected into the periarticular soft tissue.  The wound was closed in layers using #1 Stratafix for the fascia, 2-0 Vicryl for the subcutaneous fat, 2-0 Monocryl for the deep dermal layer, and staples + Dermabond for the skin.  Once the glue was fully dried, an Aquacell Ag dressing was applied.  The patient was then awakened from anesthesia and transported to the recovery room in stable condition.  Sponge, needle, and instrument counts were correct at the end of the case x2.  The patient tolerated the procedure well and there were no known  complications.  Please note that a surgical assistant was a medical necessity for this procedure to perform it in a safe and expeditious manner. Assistant was necessary to provide appropriate retraction of vital neurovascular structures, to prevent femoral fracture, and to allow for anatomic placement of the prosthesis.

## 2022-12-07 NOTE — Procedures (Signed)
Procedure Note Pre-procedure diagnosis: Left auricular hematoma Post-procedure diagnosis: Same Procedure: Incision and Drainage of Left external ear hematoma (external ear), complicated  Complications: None apparent EBL: 10 mL Date: 12/07/22    Indication: Paul Colon is a 89 y.o.  male with complicated left ear hematoma with reaccumulation and on antiplatelet therapy. Given reaccumulation, decision was made for incision and drainage of hematoma following discussion of risks/benefits of procedure. Risks including cosmetic deformity, reaccumulation, need for further procedures were explained. Patient was not able to provide consent due to dementia so consent from daughter was obtained.   The patient was identified as the correct patient and consent was obtained from daughter. There was an area superiorly where hematoma had recollected despite bolster placement, and this was injected using 1% lidocaine with 1-100,000 of epinephrine.  A small amount of the same local anesthetic mix was also injected into the posterior part of the auricle order to provide anesthesia during bolster placement.  Surrounding skin was cleansed, draped and prepped with betadine in usual fashion for procedure.   A 1 cm incision was made over the reaccumulated portion through skin and dermis. Hemostat was used to dissect into hematoma pocket. There was a copious amount of blood and clot which was evacuated. The wound cavity was irrigated with saline using angiocath. Purulence was not encountered. Any Loculation and clots were broken up and removed with hemostat. Incision was left open to avoid reaccumulation.Auricle appeared much improved. An additional bolster was fashioned from xeroform and sutured into place using multiple 3-0 prolene quilting sutures. The skin was cleansed and mupirocin ointment applied.   Patient tolerated this procedure well and there were no immediately apparent complications.

## 2022-12-07 NOTE — Assessment & Plan Note (Signed)
ENT drained 10/24, evacuated lots of blood/clot.  Bolster placed. - Continue home Keflex 500 mg twice daily and ciprofloxacin 500 mg daily, mupirocin ointment BID per ENT recs - Monitor for infectious symptoms: pt has been afebrile and no leukocytosis - Tylenol for pain control

## 2022-12-07 NOTE — Assessment & Plan Note (Signed)
From fall. Hip x-ray: Acute displaced right femoral neck fracture.  Received right hip hemiarthroplasty 10/24.  Patient appears comfortable on interview. - Received intramedullary nailing 10/25, orthopedic surgery [facility] - PRN Tylenol 650 every 6 hours for mild pain, oxycodone 2.5 to 5 mg every 4 hours as needed for breakthrough/severe pain - AM CBC, BMP - PT/OT post-op - Fall and delirium precautions

## 2022-12-07 NOTE — Assessment & Plan Note (Signed)
Per daughter, progressing over past several years, likely age-related cognitive decline with chronic ischemic changes on CT.  TSH 8/24: 2.3 WNL.  CT head: No ventriculomegaly or acute findings.  B12 and folate within normal limits. Patient daughter refused palliative care conversation 10/24.  On conversation with son, patient has had significant cognitive slowing, but can read with effort.  Son is concerned about ability of patient to participate in rehabilitation. - Delirium and fall precautions

## 2022-12-07 NOTE — Assessment & Plan Note (Addendum)
Na 125. Chronic hyponatremia around 130 at baseline. Discharged August 2024 with nephrology recommendations of fluid restriction of 1L/day and salt tablets 1 g TID. Prior labs suggest SIADH. - Continue salt tabs and fluid restriction as above - daily BMPs but also repeat this afternoon - nephrology recommended workup: will obtain Una, Uosm, and serum osm

## 2022-12-07 NOTE — Assessment & Plan Note (Signed)
Na 125.  *** Chronic hyponatremia around 130 at baseline. Discharged August 2024 with nephrology recommendations of fluid restriction of 1L/day and salt tablets 1 g TID. Prior labs suggest SIADH. - Continue salt tabs and fluid restriction as above - BMP every 12 hours - Nephrology recommended workup: Awaiting Una, Uosm, and serum osm

## 2022-12-07 NOTE — Plan of Care (Signed)
  Problem: Pain Management: Goal: General experience of comfort will improve Outcome: Progressing   Problem: Activity: Goal: Risk for activity intolerance will decrease Outcome: Not Progressing   Problem: Nutrition: Goal: Adequate nutrition will be maintained Outcome: Not Progressing

## 2022-12-07 NOTE — Assessment & Plan Note (Signed)
Per daughter, progressing over past several years, likely age-related cognitive decline with chronic ischemic changes on CT.  TSH 8/24: 2.3 WNL.  CT head: No ventriculomegaly or acute findings. - Ordered reversible causes of dementia: B12 and folate WNL. -resume multivitamin   - Delirium and fall precautions - Palliative consult for GOC

## 2022-12-07 NOTE — Plan of Care (Signed)
Spoke with Dr. Juel Burrow with nephrology regarding hyponatremia.  Plan to obtain further workup: Urine Na, Urine Cr, Serum and Urine osmolality for further classification into etiology of hyponatremia.  Will get a BMP this afternoon and closely monitor Na levels given downtrend.  If any further decline or need for assistance, will formally consult nephrology.  Darral Dash, DO

## 2022-12-07 NOTE — Progress Notes (Signed)
     Referral received for [facility]: goals of care discussion. Chart reviewed. Patient assessed and is unable to engage appropriately in discussions. Patient's daughter Paul Colon was at bedside. She states they have ACP documents at home. Now that the patient is out of the surgery they do not want to discuss goals of care at this time. She declines palliative care services at this time.  Thank you for your referral for [facility] care. PMT will sign off. Please reconsult with needs.  Sarina Ser, NP Palliative Medicine Team  Team Phone # [phone]   NO CHARGE

## 2022-12-08 DIAGNOSIS — E119 Type 2 diabetes mellitus without complications: Secondary | ICD-10-CM

## 2022-12-08 DIAGNOSIS — S72001A Fracture of unspecified part of neck of right femur, initial encounter for closed fracture: Secondary | ICD-10-CM | POA: Diagnosis not present

## 2022-12-08 LAB — BASIC METABOLIC PANEL
Anion gap: 10 (ref 5–15)
Anion gap: 11 (ref 5–15)
Anion gap: 12 (ref 5–15)
BUN: 28 mg/dL — ABNORMAL HIGH (ref 8–23)
BUN: 28 mg/dL — ABNORMAL HIGH (ref 8–23)
BUN: 34 mg/dL — ABNORMAL HIGH (ref 8–23)
CO2: 22 mmol/L (ref 22–32)
CO2: 23 mmol/L (ref 22–32)
CO2: 24 mmol/L (ref 22–32)
Calcium: 8.1 mg/dL — ABNORMAL LOW (ref 8.9–10.3)
Calcium: 8.2 mg/dL — ABNORMAL LOW (ref 8.9–10.3)
Calcium: 8.5 mg/dL — ABNORMAL LOW (ref 8.9–10.3)
Chloride: 91 mmol/L — ABNORMAL LOW (ref 98–111)
Chloride: 92 mmol/L — ABNORMAL LOW (ref 98–111)
Chloride: 94 mmol/L — ABNORMAL LOW (ref 98–111)
Creatinine, Ser: 1.38 mg/dL — ABNORMAL HIGH (ref 0.61–1.24)
Creatinine, Ser: 1.39 mg/dL — ABNORMAL HIGH (ref 0.61–1.24)
Creatinine, Ser: 1.42 mg/dL — ABNORMAL HIGH (ref 0.61–1.24)
GFR, Estimated: 47 mL/min — ABNORMAL LOW (ref >60)
GFR, Estimated: 48 mL/min — ABNORMAL LOW (ref >60)
GFR, Estimated: 48 mL/min — ABNORMAL LOW (ref >60)
Glucose, Bld: 267 mg/dL — ABNORMAL HIGH (ref 70–99)
Glucose, Bld: 284 mg/dL — ABNORMAL HIGH (ref 70–99)
Glucose, Bld: 334 mg/dL — ABNORMAL HIGH (ref 70–99)
Potassium: 4.3 mmol/L (ref 3.5–5.1)
Potassium: 4.4 mmol/L (ref 3.5–5.1)
Potassium: 4.6 mmol/L (ref 3.5–5.1)
Sodium: 125 mmol/L — ABNORMAL LOW (ref 135–145)
Sodium: 127 mmol/L — ABNORMAL LOW (ref 135–145)
Sodium: 127 mmol/L — ABNORMAL LOW (ref 135–145)

## 2022-12-08 LAB — CBC
HCT: 30.1 % — ABNORMAL LOW (ref 39.0–52.0)
Hemoglobin: 10.4 g/dL — ABNORMAL LOW (ref 13.0–17.0)
MCH: 30.4 pg (ref 26.0–34.0)
MCHC: 34.6 g/dL (ref 30.0–36.0)
MCV: 88 fL (ref 80.0–100.0)
Platelets: 287 10*3/uL (ref 150–400)
RBC: 3.42 MIL/uL — ABNORMAL LOW (ref 4.22–5.81)
RDW: 12.8 % (ref 11.5–15.5)
WBC: 11.8 10*3/uL — ABNORMAL HIGH (ref 4.0–10.5)
nRBC: 0 % (ref 0.0–0.2)

## 2022-12-08 LAB — GLUCOSE, CAPILLARY
Glucose-Capillary: 244 mg/dL — ABNORMAL HIGH (ref 70–99)
Glucose-Capillary: 289 mg/dL — ABNORMAL HIGH (ref 70–99)
Glucose-Capillary: 269 mg/dL — ABNORMAL HIGH (ref 70–99)
Glucose-Capillary: 265 mg/dL — ABNORMAL HIGH (ref 70–99)
Glucose-Capillary: 283 mg/dL — ABNORMAL HIGH (ref 70–99)

## 2022-12-08 LAB — OSMOLALITY: Osmolality: 286 mosm/kg (ref 275–295)

## 2022-12-08 MED ORDER — INSULIN ASPART 100 UNIT/ML IJ SOLN
0.0000 [IU] | INTRAMUSCULAR | Status: DC
  Administered 2022-12-08 (×4): 5 [IU] via SUBCUTANEOUS
  Administered 2022-12-09: 2 [IU] via SUBCUTANEOUS
  Administered 2022-12-09 (×2): 3 [IU] via SUBCUTANEOUS
  Administered 2022-12-09: 5 [IU] via SUBCUTANEOUS
  Administered 2022-12-10: 2 [IU] via SUBCUTANEOUS
  Administered 2022-12-10: 3 [IU] via SUBCUTANEOUS
  Administered 2022-12-10 (×3): 2 [IU] via SUBCUTANEOUS
  Administered 2022-12-11 (×2): 3 [IU] via SUBCUTANEOUS

## 2022-12-08 NOTE — Assessment & Plan Note (Addendum)
A1c 8.4.  Glucose: 335 ? 334 ? 284 ? 244 after 6 units insulin aspart - Increase insulin regimen from very sensitive ? sensitive sliding scale - CBGs 4 times daily with SSI. - Continue to monitor over course of 10/26 and consider addition of long-acting insulin - Hold metformin for now.

## 2022-12-08 NOTE — Evaluation (Signed)
Occupational Therapy Evaluation Patient Details Name: Paul Colon MRN: [id number] DOB: Jul 04, 1929 Today's Date: 12/08/2022   History of Present Illness The pt is a 89 yo male presenting 10/23 after a fall onto R hip. Pt found to have acute displaced femoral neck fx He is now s/p R nailing on 12/07/22. PMH includes: dementia, frequent falls, L femoral neck fx 01/2022, DM II, HLD, CKD, BPH, and anemia.   Clinical Impression   Pt admitted for above, new WBAT on RLE after surgery but pt declines any pain with mobility. At baseline he lives with his family and has assist with all ADLs and would ambulate with CGA provided. Pt needing significant assist for STS and transfers at this time, family also looking to trial AE to help pt with self feeding. Pt would benefit from continued acute skilled OT services to address deficits and help transition to next level of care. Patient would benefit from post acute Home OT services to help maximize functional independence in natural environment but pt does require more time working with therapies to get to a safe level for DC.        If plan is discharge home, recommend the following: Two people to help with walking and/or transfers;A lot of help with bathing/dressing/bathroom;Assistance with feeding;Assistance with cooking/housework;Supervision due to cognitive status;Help with stairs or ramp for entrance    Functional Status Assessment  Patient has had a recent decline in their functional status and demonstrates the ability to make significant improvements in function in a reasonable and predictable amount of time.  Equipment Recommendations  None recommended by OT (Family has rec DME)    Recommendations for Other Services       Precautions / Restrictions Precautions Precautions: Fall Restrictions Weight Bearing Restrictions: Yes RLE Weight Bearing: Weight bearing as tolerated      Mobility Bed Mobility Overal bed mobility: Needs Assistance Bed  Mobility: Supine to Sit     Supine to sit: Mod assist, Used rails, +2 for physical assistance, HOB elevated     General bed mobility comments: Cues for bed rail use, pt assisting with pulling and moving LLE to EOB. Pt left sitting in recliner    Transfers Overall transfer level: Needs assistance Equipment used: Rolling walker (2 wheels) Transfers: Sit to/from Stand, Bed to chair/wheelchair/BSC Sit to Stand: Mod assist, +2 physical assistance, From elevated surface Stand pivot transfers: Mod assist, +2 physical assistance         General transfer comment: STS from EOB and from [facility]. Pivot EOB>BSC>Recliner      Balance Overall balance assessment: Needs assistance Sitting-balance support: Single extremity supported, Feet supported Sitting balance-Leahy Scale: Fair Sitting balance - Comments: sitting EOB   Standing balance support: Reliant on assistive device for balance, Bilateral upper extremity supported, During functional activity Standing balance-Leahy Scale: Poor Standing balance comment: Reliant on ext support                           ADL either performed or assessed with clinical judgement   ADL Overall ADL's : Needs assistance/impaired Eating/Feeding: Maximal assistance;Sitting Eating/Feeding Details (indicate cue type and reason): Has assist from family at baseline d/t tremors Grooming: Sitting;Set up;Wash/dry face Grooming Details (indicate cue type and reason): Slow initiation but washed his own face after a couple of prompts Upper Body Bathing: Sitting;Moderate assistance   Lower Body Bathing: Sitting/lateral leans;Maximal assistance   Upper Body Dressing : Sitting;Maximal assistance Upper Body Dressing Details (indicate cue type  and reason): doff/don gown     Toilet Transfer: Moderate assistance;+2 for physical assistance;BSC/3in1;Stand-pivot;Rolling walker (2 wheels)   Toileting- Clothing Manipulation and Hygiene: +2 for physical  assistance;Moderate assistance Toileting - Clothing Manipulation Details (indicate cue type and reason): Max A for wiping     Functional mobility during ADLs: Moderate assistance;+2 for physical assistance;Rolling walker (2 wheels) (pivots) General ADL Comments: Son in Social worker present and supportive     Vision         Perception         Praxis         Pertinent Vitals/Pain Pain Assessment Pain Assessment: No/denies pain     Extremity/Trunk Assessment Upper Extremity Assessment Upper Extremity Assessment: Generalized weakness (ROM WFL)   Lower Extremity Assessment Lower Extremity Assessment: Defer to PT evaluation       Communication Communication Communication: Hearing impairment (R hearing aid) Cueing Techniques: Verbal cues;Gestural cues   Cognition Arousal: Alert Behavior During Therapy: WFL for tasks assessed/performed Overall Cognitive Status: History of cognitive impairments - at baseline                                 General Comments: Family reports pt is a bit more confused than baseline, disoriented to situation. HOH so hard to fully assess cognition but displyaing slow processing, decreased initiation, and increased time to follow commands after repetition     General Comments  Pt denies dizziness or pain throughout session    Exercises     Shoulder Instructions      Home Living Family/patient expects to be discharged to:: Private residence Living Arrangements: Children (daughter and SIL) Available Help at Discharge: Family;Available 24 hours/day Type of Home: House Home Access: Stairs to enter [employer] of Steps: 3 Entrance Stairs-Rails: Can reach both Home Layout: Two level;Other (Comment);1/2 bath on main level (bedroom on main level) Alternate Level Stairs-Number of Steps: flight Alternate Level Stairs-Rails: Left Bathroom Shower/Tub: Tub/shower unit   Bathroom Toilet: Standard     Home Equipment: Rollator (4  wheels);Cane - single point;Shower seat;Wheelchair - manual;BSC/3in1   Additional Comments: Son in Social worker notes a couple falls recently, he reports pt is getting weaker      Prior Functioning/Environment Prior Level of Function : Needs assist;History of Falls (last six months)             Mobility Comments: Ambulates with CGA + RW, family support ADLs Comments: family helps him with ADLs and IADLs. Family helps with getting in shower        OT Problem List: Decreased strength;Impaired balance (sitting and/or standing);Decreased cognition      OT Treatment/Interventions: Self-care/ADL training;Balance training;Therapeutic exercise;Therapeutic activities;Patient/family education    OT Goals(Current goals can be found in the care plan section) Acute Rehab OT Goals Patient Stated Goal: To go home OT Goal Formulation: With family Time For Goal Achievement: 12/22/22 Potential to Achieve Goals: Good ADL Goals Pt Will Perform Eating: with adaptive utensils;sitting;with min assist Pt Will Perform Lower Body Bathing: with caregiver independent in assisting;sitting/lateral leans;with min assist Pt Will Perform Lower Body Dressing: with caregiver independent in assisting;sit to/from stand;with mod assist Pt Will Transfer to Toilet: bedside commode;stand pivot transfer;with min assist  OT Frequency: Min 1X/week    Co-evaluation PT/OT/SLP Co-Evaluation/Treatment: Yes Reason for Co-Treatment: Complexity of the patient's impairments (multi-system involvement);To address functional/ADL transfers PT goals addressed during session: Balance;Mobility/safety with mobility;Proper use of DME OT goals addressed during session:  ADL's and self-care      AM-PAC OT "6 Clicks" Daily Activity     Outcome Measure Help from another person eating meals?: A Lot Help from another person taking care of personal grooming?: A Little Help from another person toileting, which includes using toliet, bedpan, or  urinal?: A Lot Help from another person bathing (including washing, rinsing, drying)?: A Lot Help from another person to put on and taking off regular upper body clothing?: A Lot Help from another person to put on and taking off regular lower body clothing?: A Lot 6 Click Score: 13   End of Session Equipment Utilized During Treatment: Gait belt;Rolling walker (2 wheels) Nurse Communication: Mobility status  Activity Tolerance: Patient tolerated treatment well Patient left: in chair;with call bell/phone within reach;with chair alarm set;with family/visitor present  OT Visit Diagnosis: Unsteadiness on feet (R26.81);Other abnormalities of gait and mobility (R26.89);History of falling (Z91.81);Muscle weakness (generalized) (M62.81)                Time: 9147-8295 OT Time Calculation (min): 39 min Charges:  OT Evaluation $OT Eval Moderate Complexity: 1 Mod  12/08/2022  AB, OTR/L  Acute Rehabilitation Services  Office: [phone]   Tristan Schroeder 12/08/2022, 10:47 AM

## 2022-12-08 NOTE — Progress Notes (Signed)
   Subjective:  Patient reports pain as mild.  Resting comfortably this morning  Objective:   VITALS:   Vitals:   12/07/22 1536 12/07/22 2003 12/08/22 0631 12/08/22 0812  BP: (!) 130/95 (!) 149/59 (!) 133/58 133/61  Pulse: 64 63 69 71  Resp: 16 16 16 18   Temp: 97.9 F (36.6 C)  98.2 F (36.8 C) 98.3 F (36.8 C)  TempSrc:   Oral   SpO2: 97% 95% 94% 97%  Weight:      Height:        Incision: dressing C/D/I Compartment soft No pain on palpation of the calf.  No signs of DVT. Good distal pulse.  Lab Results  Component Value Date   WBC 11.8 (H) 12/08/2022   HGB 10.4 (L) 12/08/2022   HCT 30.1 (L) 12/08/2022   MCV 88.0 12/08/2022   PLT 287 12/08/2022   BMET    Component Value Date/Time   NA 127 (L) 12/08/2022 0333   K 4.6 12/08/2022 0333   CL 91 (L) 12/08/2022 0333   CO2 24 12/08/2022 0333   GLUCOSE 284 (H) 12/08/2022 0333   BUN 28 (H) 12/08/2022 0333   CREATININE 1.42 (H) 12/08/2022 0333   CALCIUM 8.5 (L) 12/08/2022 0333   GFRNONAA 47 (L) 12/08/2022 0333     Assessment/Plan: 1 Day Post-Op   Principal Problem:   Hip fracture (HCC) Active Problems:   Hyponatremia   Anemia   Hematoma of left auricular region   Severe dementia (HCC)   Chest x-ray abnormality   T2DM (type 2 diabetes mellitus) (HCC)   Advance diet Up with therapy  -Okay for weightbearing as tolerated.  Discharge planning per primary service.   Yolonda Kida 12/08/2022, 8:36 AM   Maryan Rued, MD [phone]

## 2022-12-08 NOTE — Evaluation (Signed)
Physical Therapy Evaluation Patient Details Name: Paul Colon MRN: [id number] DOB: October 31, 1929 Today's Date: 12/08/2022  History of Present Illness  The pt is a 89 yo male presenting 10/23 after a fall onto R hip. Pt found to have acute displaced femoral neck fx He is now s/p R nailing on 12/07/22. PMH includes: dementia, frequent falls, L femoral neck fx 01/2022, DM II, HLD, CKD, BPH, and anemia.   Clinical Impression  Pt in bed upon arrival of PT, agreeable to evaluation at this time. Prior to admission the pt was walking with supervision from family, using RW, cane, or WC depending on distance traveled. Pt's family report assist with all ADLs and IADLs. The pt required modA of 2 to complete bed mobility, sit-stand transfers, and pivot to [facility] and then to recliner at this time. He had no buckling in LLE, but is limited in wt bearing and needs additional assist to both power up to standing and maintain static stance. Will continue to benefit from acute PT to progress independence with transfers as pt's family is hopeful to return home with [facility] therapies once pt is able to complete basic transfers with +1 assist.      If plan is discharge home, recommend the following: Two people to help with walking and/or transfers;Two people to help with bathing/dressing/bathroom;Assistance with cooking/housework;Help with stairs or ramp for entrance   Can travel by private vehicle        Equipment Recommendations None recommended by PT  Recommendations for Other Services       Functional Status Assessment Patient has had a recent decline in their functional status and demonstrates the ability to make significant improvements in function in a reasonable and predictable amount of time.     Precautions / Restrictions Precautions Precautions: Fall Precaution Comments: HOH with R hearing aid Restrictions Weight Bearing Restrictions: Yes RLE Weight Bearing: Weight bearing as tolerated      Mobility   Bed Mobility Overal bed mobility: Needs Assistance Bed Mobility: Supine to Sit     Supine to sit: Mod assist, Used rails, +2 for physical assistance, HOB elevated     General bed mobility comments: Cues for bed rail use, pt assisting with pulling and moving LLE to EOB. Pt left sitting in recliner    Transfers Overall transfer level: Needs assistance Equipment used: Rolling walker (2 wheels) Transfers: Sit to/from Stand, Bed to chair/wheelchair/BSC Sit to Stand: Mod assist, +2 physical assistance, From elevated surface Stand pivot transfers: Mod assist, +2 physical assistance         General transfer comment: STS from EOB and from [facility]. Pivot EOB>BSC>Recliner    Ambulation/Gait               General Gait Details: limited to small lateral steps to [facility] then to recliner. assist to manage RW and maintain balance with stepping     Balance Overall balance assessment: Needs assistance Sitting-balance support: Single extremity supported, Feet supported Sitting balance-Leahy Scale: Fair Sitting balance - Comments: sitting EOB Postural control: Posterior lean Standing balance support: Reliant on assistive device for balance, Bilateral upper extremity supported, During functional activity Standing balance-Leahy Scale: Poor Standing balance comment: Reliant on ext support                             Pertinent Vitals/Pain Pain Assessment Pain Assessment: Faces Faces Pain Scale: No hurt Pain Intervention(s): Monitored during session    Home Living Family/patient expects to  be discharged to:: Private residence Living Arrangements: Children (daughter and SIL) Available Help at Discharge: Family;Available 24 hours/day Type of Home: House Home Access: Stairs to enter Entrance Stairs-Rails: Can reach both Entrance Stairs-Number of Steps: 3 Alternate Level Stairs-Number of Steps: flight Home Layout: Two level;Other (Comment);1/2 bath on main level (bedroom on main  level) Home Equipment: Rollator (4 wheels);Cane - single point;Shower seat;Wheelchair - Mudlogger Comments: Son in Social worker notes a couple falls recently, he reports pt is getting weaker    Prior Function Prior Level of Function : Needs assist;History of Falls (last six months)             Mobility Comments: Ambulates with CGA + RW, family support ADLs Comments: family helps him with ADLs and IADLs. Family helps with getting in shower     Extremity/Trunk Assessment   Upper Extremity Assessment Upper Extremity Assessment: Defer to OT evaluation    Lower Extremity Assessment Lower Extremity Assessment: Generalized weakness;Difficult to assess due to impaired cognition (limited ROM LLE due to pain, no overt buckling, reports sensation intact)    Cervical / Trunk Assessment Cervical / Trunk Assessment: Kyphotic  Communication   Communication Communication: Hearing impairment (R hearing aid in) Cueing Techniques: Verbal cues;Gestural cues (pt can read lips)  Cognition Arousal: Alert Behavior During Therapy: WFL for tasks assessed/performed Overall Cognitive Status: History of cognitive impairments - at baseline                                 General Comments: Family reports pt is a bit more confused than baseline, disoriented to situation. HOH so hard to fully assess cognition but displyaing slow processing, decreased initiation, and increased time to follow commands after repetition        General Comments General comments (skin integrity, edema, etc.): Pt denies dizziness or pain throughout session    Exercises     Assessment/Plan    PT Assessment Patient needs continued PT services  PT Problem List Decreased balance;Decreased strength;Decreased range of motion;Decreased activity tolerance;Decreased mobility;Decreased safety awareness       PT Treatment Interventions DME instruction;Gait training;Stair training;Functional mobility  training;Therapeutic activities;Therapeutic exercise;Balance training    PT Goals (Current goals can be found in the Care Plan section)  Acute Rehab PT Goals Patient Stated Goal: per son-in-law: to return home with family when +1 assist PT Goal Formulation: With patient/family Time For Goal Achievement: 12/22/22 Potential to Achieve Goals: Good    Frequency Min 1X/week     Co-evaluation PT/OT/SLP Co-Evaluation/Treatment: Yes Reason for Co-Treatment: Complexity of the patient's impairments (multi-system involvement);To address functional/ADL transfers PT goals addressed during session: Balance;Mobility/safety with mobility;Proper use of DME OT goals addressed during session: ADL's and self-care       AM-PAC PT "6 Clicks" Mobility  Outcome Measure Help needed turning from your back to your side while in a flat bed without using bedrails?: A Lot Help needed moving from lying on your back to sitting on the side of a flat bed without using bedrails?: Total Help needed moving to and from a bed to a chair (including a wheelchair)?: Total Help needed standing up from a chair using your arms (e.g., wheelchair or bedside chair)?: Total Help needed to walk in hospital room?: Total Help needed climbing 3-5 steps with a railing? : Total 6 Click Score: 7    End of Session Equipment Utilized During Treatment: Gait belt Activity Tolerance: Patient tolerated treatment well  Patient left: in chair;with call bell/phone within reach;with chair alarm set;with family/visitor present Nurse Communication: Mobility status (use stedy) PT Visit Diagnosis: Unsteadiness on feet (R26.81);Other abnormalities of gait and mobility (R26.89);Muscle weakness (generalized) (M62.81);Difficulty in walking, not elsewhere classified (R26.2);Pain Pain - Right/Left: Left Pain - part of body: Hip    Time: 2706-2376 PT Time Calculation (min) (ACUTE ONLY): 39 min   Charges:   PT Evaluation $PT Eval Low Complexity: 1  Low PT Treatments $Therapeutic Activity: 8-22 mins PT General Charges $$ ACUTE PT VISIT: 1 Visit         Vickki Muff, PT, DPT   Acute Rehabilitation Department Office [phone] Secure Chat Communication Preferred  Ronnie Derby 12/08/2022, 2:14 PM

## 2022-12-09 ENCOUNTER — Inpatient Hospital Stay (HOSPITAL_COMMUNITY): Payer: No Typology Code available for payment source

## 2022-12-09 DIAGNOSIS — S00432A Contusion of left ear, initial encounter: Secondary | ICD-10-CM | POA: Diagnosis not present

## 2022-12-09 DIAGNOSIS — S72001A Fracture of unspecified part of neck of right femur, initial encounter for closed fracture: Secondary | ICD-10-CM | POA: Diagnosis not present

## 2022-12-09 LAB — CBC
HCT: 30.4 % — ABNORMAL LOW (ref 39.0–52.0)
Hemoglobin: 10.2 g/dL — ABNORMAL LOW (ref 13.0–17.0)
MCH: 30.6 pg (ref 26.0–34.0)
MCHC: 33.6 g/dL (ref 30.0–36.0)
MCV: 91.3 fL (ref 80.0–100.0)
Platelets: 317 10*3/uL (ref 150–400)
RBC: 3.33 MIL/uL — ABNORMAL LOW (ref 4.22–5.81)
RDW: 12.8 % (ref 11.5–15.5)
WBC: 11.3 10*3/uL — ABNORMAL HIGH (ref 4.0–10.5)
nRBC: 0 % (ref 0.0–0.2)

## 2022-12-09 LAB — GLUCOSE, CAPILLARY
Glucose-Capillary: 106 mg/dL — ABNORMAL HIGH (ref 70–99)
Glucose-Capillary: 166 mg/dL — ABNORMAL HIGH (ref 70–99)
Glucose-Capillary: 217 mg/dL — ABNORMAL HIGH (ref 70–99)
Glucose-Capillary: 240 mg/dL — ABNORMAL HIGH (ref 70–99)
Glucose-Capillary: 257 mg/dL — ABNORMAL HIGH (ref 70–99)

## 2022-12-09 LAB — BASIC METABOLIC PANEL
Anion gap: 12 (ref 5–15)
Anion gap: 9 (ref 5–15)
BUN: 29 mg/dL — ABNORMAL HIGH (ref 8–23)
BUN: 28 mg/dL — ABNORMAL HIGH (ref 8–23)
CO2: 24 mmol/L (ref 22–32)
CO2: 23 mmol/L (ref 22–32)
Calcium: 8.4 mg/dL — ABNORMAL LOW (ref 8.9–10.3)
Calcium: 7.8 mg/dL — ABNORMAL LOW (ref 8.9–10.3)
Chloride: 94 mmol/L — ABNORMAL LOW (ref 98–111)
Chloride: 96 mmol/L — ABNORMAL LOW (ref 98–111)
Creatinine, Ser: 1.28 mg/dL — ABNORMAL HIGH (ref 0.61–1.24)
Creatinine, Ser: 0.98 mg/dL (ref 0.61–1.24)
GFR, Estimated: 53 mL/min — ABNORMAL LOW (ref >60)
GFR, Estimated: >60 mL/min (ref >60)
Glucose, Bld: 166 mg/dL — ABNORMAL HIGH (ref 70–99)
Glucose, Bld: 189 mg/dL — ABNORMAL HIGH (ref 70–99)
Potassium: 4.4 mmol/L (ref 3.5–5.1)
Potassium: 4.3 mmol/L (ref 3.5–5.1)
Sodium: 130 mmol/L — ABNORMAL LOW (ref 135–145)
Sodium: 128 mmol/L — ABNORMAL LOW (ref 135–145)

## 2022-12-09 MED ORDER — CLOPIDOGREL BISULFATE 75 MG PO TABS
75.0000 mg | ORAL_TABLET | Freq: Every day | ORAL | Status: DC
  Administered 2022-12-09 – 2022-12-11 (×3): 75 mg via ORAL
  Filled 2022-12-09 (×3): qty 1

## 2022-12-09 MED ORDER — ORAL CARE MOUTH RINSE: 15.0000 mL | OROMUCOSAL | Status: DC | PRN

## 2022-12-09 MED ORDER — STROKE: EARLY STAGES OF RECOVERY BOOK
Freq: Once | Status: AC
Start: 2022-12-10 — End: 2022-12-10
  Filled 2022-12-09: qty 1

## 2022-12-09 NOTE — Assessment & Plan Note (Addendum)
S/p right hemiarthroplasty on 10/24.  Has not required any as needed pain meds. Will follow-up with PT recommendations and set family up for possible home health PT at the request. -Pain management: sch Tylenol, oxy as needed -PT/OT following

## 2022-12-09 NOTE — Progress Notes (Signed)
Called daughter Wallie Char and discussed that MRI showing acute R temporal lobe stroke. Answered questions and concerns. Neurology consulted, will begin stroke work up and add Plavix.  Burley Saver MD

## 2022-12-09 NOTE — Assessment & Plan Note (Addendum)
BGL consistently in 200s. -sSSI q4h

## 2022-12-09 NOTE — Progress Notes (Addendum)
FMTS Attending Daily Note: Paul Saver, MD  Team Pager [phone] Pager [phone] I have personally seen and examined this patient, reviewed their chart and results. I have discussed this patient with the resident. I agree with the resident's findings, assessment and care plan as documented in the resident's note, clarifications or changes to the resident's note are listed below.  Pt is not oriented to date or location today. He is alert and eating breakfast. Son in law at bedside and daughter on phone who note he is still not back to his baseline despite sodium correcting today. She notes he is  usually doing word puzzles and is oriented to date and location. Son in Social worker notes he has had some short term memory issues over the past six months but has not been diagnosed with dementia, though cognitive decline suspected. He is also hard of hearing so they wonder and know that can contribute. We discussed getting an MRI as though his CT head was negative on admission and with his hip fracture and hyponatremia we had other potential causes of his encephalopathy, he is not back to baseline. MRI returned this evening showing acute R temporal lobe CVA- called and discussed with his daughter and neurology consulted, greatly appreciate their recommendations.        Daily Progress Note Intern Pager: [phone]  Patient name: Paul Colon Medical record number: [id number] Date of birth: [birth year] Age: 89 y.o. Gender: male  Primary Care Provider: [facility] Consultants: Orthopedic surgery, ENT Code Status: Full code  Pt Overview and Major Events to Date:  10/23: Admitted to [facility] 10/24: Right hip arthroplasty; I&D of left auricular hematoma  Assessment and Plan: Paul Colon is a 89 year old male who presented with a right hip fracture status post fixation 10/24 and left ear hematoma status post drainage.  Assessment & Plan Hip fracture Paul Colon) S/p right hemiarthroplasty on 10/24.  Has not  required any as needed pain meds. Will follow-up with PT recommendations and set family up for possible home health PT at the request. -Pain management: sch Tylenol, oxy as needed -PT/OT following Hyponatremia Chronic, baseline NA around 130.  Has been stable around 127, will follow-up with AM labs when available -Pending urine osmole studies -Continue salt tablet and fluid restriction per Nephro -Daily BMP Hematoma of left auricular region S/p drainage on 10/24.  Stable -Continue Keflex 500 mg BID and ciprofloxacin 500 mg daily per ENT Cognitive decline Lab work negative. Hyponatremia improving but per family still not back to baseline. MRI head ordered today showing acute CVA, see attestation above.  -Delirium and fall precautions T2DM (type 2 diabetes mellitus) (HCC) BGL consistently in 200s. -sSSI q4h   Chronic and Stable Problems:  Chronic kidney disease stage IIIa: Creatinine stable HLD: Continue statin BPH: continue finasteride Cataracts: Stopped Cosopt. Continue timolol, difluprednate, and muro.   FEN/GI: Carb modified diet PPx: Lovenox Dispo: Possibly SNF pending clinical improvement  Subjective:  Patient resting comfortably, son-in-law at bedside.  Patient without concerns or pain this morning.  Alert but not oriented.  Son-in-law communicated they would like to bring him home for physical therapy.  Have walking devices and shower bench available but will consider additional resources needed prior to discharge.  Objective: Temp:  [98.3 F (36.8 C)] 98.3 F (36.8 C) (10/27 1426) Pulse Rate:  [68-69] 68 (10/27 1426) Resp:  [16-17] 17 (10/27 1426) BP: (120-162)/(56-84) 127/60 (10/27 1426) SpO2:  [97 %-98 %] 98 % (10/27 1426) Physical Exam: General: Elderly appearing male resting  comfortably in bed.  No acute distress HEENT: Bandage covering left ear, mild serosanguineous drainage.  Mild erythema, no warmth or induration noted. Cardiovascular: RRR without  murmur Respiratory: CTAB.  Normal work of breathing on room air Abdomen: Soft, nondistended Extremities: No peripheral edema MSK: Bandage in place over right hip without drainage noted.  Distal sensation and motor function intact.  Distal capillary refill good.  Laboratory: Most recent CBC Lab Results  Component Value Date   WBC 11.3 (H) 12/09/2022   HGB 10.2 (L) 12/09/2022   HCT 30.4 (L) 12/09/2022   MCV 91.3 12/09/2022   PLT 317 12/09/2022   Most recent BMP    Latest Ref Rng & Units 12/09/2022    7:55 AM  BMP  Glucose 70 - 99 mg/dL 161   BUN 8 - 23 mg/dL 29   Creatinine 0.96 - 1.24 mg/dL 0.45   Sodium 409 - 811 mmol/L 130   Potassium 3.5 - 5.1 mmol/L 4.4   Chloride 98 - 111 mmol/L 94   CO2 22 - 32 mmol/L 24   Calcium 8.9 - 10.3 mg/dL 8.4     Other pertinent labs: BGL in 200s  Imaging/Diagnostic Tests: None in the past 24 hours   Billey Co, MD 12/09/2022, 6:18 PM  PGY-2, [facility] FPTS Intern pager: [phone], text pages welcome Secure chat group [facility] Teaching Service

## 2022-12-09 NOTE — Progress Notes (Signed)
   Subjective: 2 Days Post-Op Procedure(s) (LRB): ANTERIOR APPROACH HEMI HIP ARTHROPLASTY (Right)  Pt resting comfortably in NAD Discussed case with family member in the room Minimal pain currently Worked with therapy yesterday Denies any new symptoms or issues Patient reports pain as mild.  Objective:   VITALS:   Vitals:   12/08/22 1952 12/09/22 0434  BP: (!) 120/56 (!) 162/84  Pulse: 69 68  Resp: 16 16  Temp:    SpO2: 97% 98%    Right hip: incision healing well Nv intact distally Dressing intact No rashes, drainage or erythema  LABS Recent Labs    12/06/22 0814 12/07/22 0830 12/08/22 0333  HGB 11.2* 11.1* 10.4*  HCT 31.7* 32.3* 30.1*  WBC 10.3 10.9* 11.8*  PLT 292 263 287    Recent Labs    12/08/22 0001 12/08/22 0333 12/08/22 1837  NA 125* 127* 127*  K 4.4 4.6 4.3  BUN 28* 28* 34*  CREATININE 1.39* 1.42* 1.38*  GLUCOSE 334* 284* 267*     Assessment/Plan: 2 Days Post-Op Procedure(s) (LRB): ANTERIOR APPROACH HEMI HIP ARTHROPLASTY (Right) Continue PT/OT Pain management as needed D/c planning Pulmonary toilet Will continue to monitor his progress   Alphonsa Overall PA-C, MPAS [facility] is now [facility] [employer] [facility] [address], [place], [place] [address] Phone: [phone] [email or web address] Facebook  [employer]

## 2022-12-09 NOTE — Assessment & Plan Note (Addendum)
S/p drainage on 10/24.  Stable -Continue Keflex 500 mg BID and ciprofloxacin 500 mg daily per ENT

## 2022-12-09 NOTE — Plan of Care (Signed)
  Problem: Clinical Measurements: Goal: Diagnostic test results will improve Outcome: Progressing   Problem: Activity: Goal: Risk for activity intolerance will decrease Outcome: Progressing   Problem: Elimination: Goal: Will not experience complications related to bowel motility Outcome: Progressing   Problem: Pain Management: Goal: General experience of comfort will improve Outcome: Progressing   Problem: Pain Management: Goal: Pain level will decrease Outcome: Progressing

## 2022-12-09 NOTE — Progress Notes (Signed)
Physical Therapy Treatment Patient Details Name: Paul Colon MRN: [id number] DOB: 1929/04/11 Today's Date: 12/09/2022   History of Present Illness The pt is a 89 yo male presenting 10/23 after a fall onto R hip. Pt found to have acute displaced femoral neck fx He is now s/p R nailing on 12/07/22. PMH includes: dementia, frequent falls, L femoral neck fx 01/2022, DM II, HLD, CKD, BPH, and anemia.    PT Comments  The pt was able to make great progress with sit-stand transfers and mobility this afternoon. He completed x4 repeated sit-stand transfers from EOB and progressed from minA of 2 to CGA with practice. He was also able to complete ~6 ft ambulation with use of RW and minA for balance and at times RW management. Will continue to benefit from skilled PT to progress ambulation endurance (pt needs to be able to walk at least 30 ft household distance) and to complete stair training. Pt's son-in-law present and updated, in agreement with plan.    If plan is discharge home, recommend the following: Assistance with cooking/housework;Help with stairs or ramp for entrance;A lot of help with walking and/or transfers;A lot of help with bathing/dressing/bathroom;Direct supervision/assist for medications management;Direct supervision/assist for financial management   Can travel by private vehicle        Equipment Recommendations  None recommended by PT    Recommendations for Other Services       Precautions / Restrictions Precautions Precautions: Fall Precaution Comments: HOH with R hearing aid Restrictions Weight Bearing Restrictions: Yes RLE Weight Bearing: Weight bearing as tolerated     Mobility  Bed Mobility Overal bed mobility: Needs Assistance Bed Mobility: Supine to Sit     Supine to sit: Mod assist, Used rails, +2 for physical assistance, HOB elevated     General bed mobility comments: Cues for bed rail use, pt assisting with pulling and moving LLE to EOB. Pt left sitting in  recliner    Transfers Overall transfer level: Needs assistance Equipment used: Rolling walker (2 wheels) Transfers: Sit to/from Stand, Bed to chair/wheelchair/BSC Sit to Stand: Min assist, Contact guard assist Stand pivot transfers: Min assist         General transfer comment: minA to CGA with sit-stand from EOB with cues for hand palcement. repeated x4 from EOB and then x1 from recliner. still needing minA to manage RW movement and balance while turning    Ambulation/Gait Ambulation/Gait assistance: Min assist Gait Distance (Feet): 6 Feet Assistive device: Rolling walker (2 wheels) Gait Pattern/deviations: Step-through pattern, Decreased stride length, Shuffle, Trunk flexed Gait velocity: decreased     General Gait Details: pt with small steps from EOB, minA to steady, cues fpr upright posture   Stairs             Wheelchair Mobility     Tilt Bed    Modified Rankin (Stroke Patients Only)       Balance Overall balance assessment: Needs assistance Sitting-balance support: Single extremity supported, Feet supported Sitting balance-Leahy Scale: Fair Sitting balance - Comments: sitting EOB Postural control: Posterior lean Standing balance support: Reliant on assistive device for balance, Bilateral upper extremity supported, During functional activity Standing balance-Leahy Scale: Poor Standing balance comment: Reliant on ext support                            Cognition Arousal: Alert Behavior During Therapy: WFL for tasks assessed/performed Overall Cognitive Status: History of cognitive impairments - at baseline  General Comments: pt able to follow simple commands with increased time and often cues repeated. more alert and engaged this session        Exercises      General Comments General comments (skin integrity, edema, etc.): grossly improved from eval, still needs stair training. son-in-law  present and updated      Pertinent Vitals/Pain Pain Assessment Pain Assessment: No/denies pain Faces Pain Scale: No hurt Pain Intervention(s): Monitored during session    Home Living                          Prior Function            PT Goals (current goals can now be found in the care plan section) Acute Rehab PT Goals Patient Stated Goal: per son-in-law: to return home with family when +1 assist PT Goal Formulation: With patient/family Time For Goal Achievement: 12/22/22 Potential to Achieve Goals: Good Progress towards PT goals: Progressing toward goals    Frequency    Min 1X/week      PT Plan      Co-evaluation PT/OT/SLP Co-Evaluation/Treatment: Yes Reason for Co-Treatment: Complexity of the patient's impairments (multi-system involvement);To address functional/ADL transfers PT goals addressed during session: Balance;Mobility/safety with mobility;Proper use of DME        AM-PAC PT "6 Clicks" Mobility   Outcome Measure  Help needed turning from your back to your side while in a flat bed without using bedrails?: A Lot Help needed moving from lying on your back to sitting on the side of a flat bed without using bedrails?: A Lot Help needed moving to and from a bed to a chair (including a wheelchair)?: A Little Help needed standing up from a chair using your arms (e.g., wheelchair or bedside chair)?: A Little Help needed to walk in hospital room?: Total (<20 ft) Help needed climbing 3-5 steps with a railing? : A Lot 6 Click Score: 13    End of Session Equipment Utilized During Treatment: Gait belt Activity Tolerance: Patient tolerated treatment well Patient left: in chair;with call bell/phone within reach;with chair alarm set;with family/visitor present Nurse Communication: Mobility status (use stedy) PT Visit Diagnosis: Unsteadiness on feet (R26.81);Other abnormalities of gait and mobility (R26.89);Muscle weakness (generalized) (M62.81);Difficulty  in walking, not elsewhere classified (R26.2);Pain Pain - Right/Left: Left Pain - part of body: Hip     Time: 1559-1630 PT Time Calculation (min) (ACUTE ONLY): 31 min  Charges:    $Gait Training: 8-22 mins PT General Charges $$ ACUTE PT VISIT: 1 Visit                     Vickki Muff, PT, DPT   Acute Rehabilitation Department Office [phone] Secure Chat Communication Preferred   Ronnie Derby 12/09/2022, 5:01 PM

## 2022-12-09 NOTE — Progress Notes (Signed)
ENT PROGRESS NOTE  Date of consult (12/07/2022): for Left Auricular Hematoma  Subjective: Patient seen and examined at bedside. No issues overnight. He is more responsive today and answers questions compared to my last visit. He denies any ear pain. Bolster examined, in place. No evidence of recollection today.  Objective: Vital signs in last 24 hours: Temp:  [98 F (36.7 C)] 98 F (36.7 C) (10/26 1214) Pulse Rate:  [67-69] 68 (10/27 0434) Resp:  [16-18] 16 (10/27 0434) BP: (120-162)/(56-84) 162/84 (10/27 0434) SpO2:  [97 %-99 %] 98 % (10/27 0434)  Relevant ENT physical exam: Awake, alert, no distress AO x 2, repsonsive to commands [place]/AT Right auricle with bolster in place secured with prolenes. Otherwise no evidence of reaccumulation of hematoma. Canal wnl; cellulitis surrounding spares some of the lobule has improved, now minimal; no postauricular fluctuance Left ear:   canal normal, external ear normal, some cerumen Uvula midline; no large oral masses Neck supple, no stridor or distress  Labs reviewed as below: Recent Labs    12/08/22 1837 12/09/22 0755  NA 127* 130*  K 4.3 4.4  CL 94* 94*  CO2 23 24  GLUCOSE 267* 166*  BUN 34* 29*  CREATININE 1.38* 1.28*  CALCIUM 8.1* 8.4*  WBC 11.3 (between 10 and 12 over last 4 days)  Medications: Reviewed  Imaging reviewed and interpreted independently: CT Head 10/23: mastoids and ME well aerated; bolster in place,some gas but no auricular hematoma reaccumulation noted then   Assessment/Plan: Paul Colon is a 89 y.o. male with reaccumulation of left auricular hematoma with likely cellulitis surrounding.  Left auricular hematoma Left auricular cellulitis - improved significantly Re-drained 10/25, no evidence of reaccumulation today - Continue PO ciprofloxacin and keflex 500mg  BID x7d - On ASA 325, therefore will leave bolster in place until ~11/01 - Mupirocin 2% ointment to auricle BID until bolster removal - Avoid getting  bolster wet or putting pressure on the ear - don't wear Colon aid on that side until bolster removed - ENT will continue to follow intermittently while in house; if patient discharged prior to bolster removal, please notify us and we will get him an outpt appt  Department of Otolaryngology Contact Info: Otolaryngology Front Desk Phone including questions about appointments or questions: [phone] - If after normal business hours (Monday-Friday after 5PM or Weekends/Holidays), please call same number and follow prompts for Patient Access Line. There is a physician on call for urgent matters. For life threatening emergencies, please call 911   Thank you for involving me in the care of this patient. Please do not hesitate to contact me with any questions Jovita Kussmaul, MD [facility] Phone: [phone] Fax: [phone]    LOS: 4 days

## 2022-12-09 NOTE — Care Plan (Signed)
MRI brain shows punctate focus in right temporal lobe compatible with small acute infarct.  I spoke with neurology- added Plavix 75 mg along with ASA (currently taking s/p orthopedic surgery for hip fracture fixation).  Will also need further workup (CTA, Echo) given full code/desire for full medical intervention.  Darral Dash, DO

## 2022-12-09 NOTE — Progress Notes (Signed)
Occupational Therapy Treatment Patient Details Name: Paul Colon MRN: [id number] DOB: 1929/12/12 Today's Date: 12/09/2022   History of present illness The pt is a 89 yo male presenting 10/23 after a fall onto R hip. Pt found to have acute displaced femoral neck fx He is now s/p R nailing on 12/07/22. PMH includes: dementia, frequent falls, L femoral neck fx 01/2022, DM II, HLD, CKD, BPH, and anemia.   OT comments  Pt progressing toward established OT goals. Family with significant wishes to take family home, thus, focus of session on optimizing independence and safety with transfers. Pt needing min A +2 progressing to CGA for STS transfers performed at CGA level 3x during session up from EOB. Pt with short distance ambulation to chair at min A level for balance and RW management. Pt with good progress. Min cues to problem solve during oral care sititng in chair. Will continue to follow.       If plan is discharge home, recommend the following:  Two people to help with walking and/or transfers;A lot of help with bathing/dressing/bathroom;Assistance with feeding;Assistance with cooking/housework;Supervision due to cognitive status;Help with stairs or ramp for entrance   Equipment Recommendations  None recommended by OT    Recommendations for Other Services      Precautions / Restrictions Precautions Precautions: Fall Precaution Comments: HOH with R hearing aid Restrictions Weight Bearing Restrictions: Yes RLE Weight Bearing: Weight bearing as tolerated       Mobility Bed Mobility Overal bed mobility: Needs Assistance Bed Mobility: Supine to Sit     Supine to sit: Mod assist, Used rails, +2 for physical assistance, HOB elevated     General bed mobility comments: Cues for bed rail use, pt assisting with pulling and moving LLE to EOB. Pt left sitting in recliner    Transfers Overall transfer level: Needs assistance Equipment used: Rolling walker (2 wheels) Transfers: Sit  to/from Stand, Bed to chair/wheelchair/BSC Sit to Stand: Min assist, Contact guard assist Stand pivot transfers: Min assist         General transfer comment: minA to CGA with sit-stand from EOB with cues for hand palcement. repeated x4 from EOB and then x1 from recliner. still needing minA to manage RW movement and balance while turning     Balance Overall balance assessment: Needs assistance Sitting-balance support: Single extremity supported, Feet supported Sitting balance-Leahy Scale: Fair Sitting balance - Comments: sitting EOB Postural control: Posterior lean Standing balance support: Reliant on assistive device for balance, Bilateral upper extremity supported, During functional activity Standing balance-Leahy Scale: Poor Standing balance comment: Reliant on ext support                           ADL either performed or assessed with clinical judgement   ADL Overall ADL's : Needs assistance/impaired     Grooming: Oral care;Minimal assistance;Cueing for sequencing Grooming Details (indicate cue type and reason): cues for problem solving.                 Toilet Transfer: Minimal assistance;Stand-pivot;BSC/3in1;Rolling walker (2 wheels) Toilet Transfer Details (indicate cue type and reason): cues for technique                Extremity/Trunk Assessment              Vision       Perception     Praxis      Cognition Arousal: Alert Behavior During Therapy: [facility] for tasks assessed/performed Overall  Cognitive Status: History of cognitive impairments - at baseline                                 General Comments: pt able to follow simple commands with increased time and often cues repeated. more alert and engaged this session        Exercises Exercises: Other exercises Other Exercises Other Exercises: STS 5x during session    Shoulder Instructions       General Comments family present and hopeful for pt to progress to a  level he can go home    Pertinent Vitals/ Pain       Pain Assessment Pain Assessment: No/denies pain Faces Pain Scale: No hurt Pain Intervention(s): Monitored during session  Home Living                                          Prior Functioning/Environment              Frequency  Min 1X/week        Progress Toward Goals  OT Goals(current goals can now be found in the care plan section)  Progress towards OT goals: Progressing toward goals  Acute Rehab OT Goals Patient Stated Goal: go home OT Goal Formulation: With family Time For Goal Achievement: 12/22/22 Potential to Achieve Goals: Good ADL Goals Pt Will Perform Eating: with adaptive utensils;sitting;with min assist Pt Will Perform Lower Body Bathing: with caregiver independent in assisting;sitting/lateral leans;with min assist Pt Will Perform Lower Body Dressing: with caregiver independent in assisting;sit to/from stand;with mod assist Pt Will Transfer to Toilet: bedside commode;stand pivot transfer;with min assist  Plan      Co-evaluation      Reason for Co-Treatment: Complexity of the patient's impairments (multi-system involvement);To address functional/ADL transfers PT goals addressed during session: Balance;Mobility/safety with mobility;Proper use of DME OT goals addressed during session: ADL's and self-care      AM-PAC OT "6 Clicks" Daily Activity     Outcome Measure   Help from another person eating meals?: A Lot Help from another person taking care of personal grooming?: A Little Help from another person toileting, which includes using toliet, bedpan, or urinal?: A Lot Help from another person bathing (including washing, rinsing, drying)?: A Lot Help from another person to put on and taking off regular upper body clothing?: A Lot Help from another person to put on and taking off regular lower body clothing?: A Lot 6 Click Score: 13    End of Session Equipment Utilized During  Treatment: Gait belt;Rolling walker (2 wheels)  OT Visit Diagnosis: Unsteadiness on feet (R26.81);Other abnormalities of gait and mobility (R26.89);History of falling (Z91.81);Muscle weakness (generalized) (M62.81)   Activity Tolerance Patient tolerated treatment well   Patient Left in chair;with call bell/phone within reach;with chair alarm set;with family/visitor present   Nurse Communication Mobility status        Time: 1600-1630 OT Time Calculation (min): 30 min  Charges: OT General Charges $OT Visit: 1 Visit OT Treatments $Self Care/Home Management : 8-22 mins  Tyler Deis, OTR/L [facility] Acute Rehabilitation Office: [phone]   Myrla Halsted 12/09/2022, 6:04 PM

## 2022-12-09 NOTE — Assessment & Plan Note (Addendum)
Chronic, baseline NA around 130.  Has been stable around 127, will follow-up with AM labs when available -Pending urine osmole studies -Continue salt tablet and fluid restriction per Nephro -Daily BMP

## 2022-12-09 NOTE — Assessment & Plan Note (Addendum)
Lab work negative. Hyponatremia improving but per family still not back to baseline. MRI head ordered today showing acute CVA, see attestation above.  -Delirium and fall precautions

## 2022-12-10 ENCOUNTER — Inpatient Hospital Stay (HOSPITAL_COMMUNITY): Payer: No Typology Code available for payment source

## 2022-12-10 ENCOUNTER — Encounter (HOSPITAL_COMMUNITY): Payer: Self-pay | Admitting: Orthopedic Surgery

## 2022-12-10 DIAGNOSIS — S72001A Fracture of unspecified part of neck of right femur, initial encounter for closed fracture: Secondary | ICD-10-CM | POA: Diagnosis not present

## 2022-12-10 DIAGNOSIS — E871 Hypo-osmolality and hyponatremia: Secondary | ICD-10-CM

## 2022-12-10 DIAGNOSIS — R569 Unspecified convulsions: Secondary | ICD-10-CM

## 2022-12-10 DIAGNOSIS — I639 Cerebral infarction, unspecified: Secondary | ICD-10-CM | POA: Diagnosis not present

## 2022-12-10 DIAGNOSIS — F03C0 Unspecified dementia, severe, without behavioral disturbance, psychotic disturbance, mood disturbance, and anxiety: Secondary | ICD-10-CM

## 2022-12-10 DIAGNOSIS — I6389 Other cerebral infarction: Secondary | ICD-10-CM | POA: Diagnosis not present

## 2022-12-10 DIAGNOSIS — R4182 Altered mental status, unspecified: Secondary | ICD-10-CM | POA: Diagnosis not present

## 2022-12-10 DIAGNOSIS — I63311 Cerebral infarction due to thrombosis of right middle cerebral artery: Secondary | ICD-10-CM

## 2022-12-10 DIAGNOSIS — G9341 Metabolic encephalopathy: Secondary | ICD-10-CM

## 2022-12-10 LAB — URINALYSIS, COMPLETE (UACMP) WITH MICROSCOPIC
Bacteria, UA: NONE SEEN
Bilirubin Urine: NEGATIVE
Glucose, UA: 150 mg/dL — AB
Hgb urine dipstick: NEGATIVE
Ketones, ur: 5 mg/dL — AB
Leukocytes,Ua: NEGATIVE
Nitrite: NEGATIVE
Protein, ur: NEGATIVE mg/dL
Specific Gravity, Urine: 1.015 (ref 1.005–1.030)
pH: 6 (ref 5.0–8.0)

## 2022-12-10 LAB — GLUCOSE, CAPILLARY
Glucose-Capillary: 155 mg/dL — ABNORMAL HIGH (ref 70–99)
Glucose-Capillary: 157 mg/dL — ABNORMAL HIGH (ref 70–99)
Glucose-Capillary: 171 mg/dL — ABNORMAL HIGH (ref 70–99)
Glucose-Capillary: 175 mg/dL — ABNORMAL HIGH (ref 70–99)
Glucose-Capillary: 194 mg/dL — ABNORMAL HIGH (ref 70–99)
Glucose-Capillary: 202 mg/dL — ABNORMAL HIGH (ref 70–99)

## 2022-12-10 LAB — ECHOCARDIOGRAM COMPLETE
AR max vel: 3.21 cm2
AV Area VTI: 3.37 cm2
AV Area mean vel: 3.09 cm2
AV Mean grad: 3 mm[Hg]
AV Peak grad: 5.8 mm[Hg]
Ao pk vel: 1.21 m/s
Area-P 1/2: 2.73 cm2
Height: 71 in
S' Lateral: 2.6 cm
Weight: 2335.99 [oz_av]

## 2022-12-10 LAB — LIPID PANEL
Cholesterol: 110 mg/dL (ref 0–200)
HDL: 41 mg/dL (ref >40)
LDL Cholesterol: 59 mg/dL (ref 0–99)
Total CHOL/HDL Ratio: 2.7 {ratio}
Triglycerides: 48 mg/dL (ref <150)
VLDL: 10 mg/dL (ref 0–40)

## 2022-12-10 LAB — BASIC METABOLIC PANEL
Anion gap: 12 (ref 5–15)
BUN: 18 mg/dL (ref 8–23)
CO2: 27 mmol/L (ref 22–32)
Calcium: 8.6 mg/dL — ABNORMAL LOW (ref 8.9–10.3)
Chloride: 93 mmol/L — ABNORMAL LOW (ref 98–111)
Creatinine, Ser: 0.9 mg/dL (ref 0.61–1.24)
GFR, Estimated: >60 mL/min (ref >60)
Glucose, Bld: 155 mg/dL — ABNORMAL HIGH (ref 70–99)
Potassium: 3.8 mmol/L (ref 3.5–5.1)
Sodium: 132 mmol/L — ABNORMAL LOW (ref 135–145)

## 2022-12-10 LAB — TSH: TSH: 2.526 u[IU]/mL (ref 0.350–4.500)

## 2022-12-10 MED ORDER — ASPIRIN 81 MG PO TBEC
81.0000 mg | DELAYED_RELEASE_TABLET | Freq: Every day | ORAL | Status: DC
  Administered 2022-12-10 – 2022-12-11 (×2): 81 mg via ORAL
  Filled 2022-12-10 (×2): qty 1

## 2022-12-10 MED ORDER — IOHEXOL 350 MG/ML SOLN
75.0000 mL | Freq: Once | INTRAVENOUS | Status: AC | PRN
  Administered 2022-12-10: 75 mL via INTRAVENOUS

## 2022-12-10 MED ORDER — CEPHALEXIN 500 MG PO CAPS
500.0000 mg | ORAL_CAPSULE | Freq: Two times a day (BID) | ORAL | Status: DC
  Administered 2022-12-10 – 2022-12-11 (×2): 500 mg via ORAL
  Filled 2022-12-10 (×2): qty 1

## 2022-12-10 NOTE — Assessment & Plan Note (Addendum)
Prior CT head negative and unremarkable neuro exam suggesting stroke. However, at family request for MRI brain for further evaluation of weakness/AMS which showed right temporal lobe small acute infarct. TSH 2.526. lipid panel normal.  - neuro consult: neuro checks, TTE, CTA head and neck, SLP, swallow screen, EEG, CXR, telemetry for arrhythmia - Aspirin 81mg  daily along with plavix 75mg  daily x 21 days, followed by Aspirin 81mg  daily alone. Added on plavix.  - aim for gradual normotension. BP elevated at 183/80 this morning however normotensive yesterday, monitor BP.

## 2022-12-10 NOTE — Progress Notes (Addendum)
Daily Progress Note Intern Pager: [phone]  Patient name: Paul Colon Medical record number: [id number] Date of birth: 11/05/1929 Age: 89 y.o. Gender: male  Primary Care Provider: [facility] Consultants: Orthopedic surgery, ENT, neuro Code Status: Full code   Pt Overview and Major Events to Date:  10/23: Admitted to FM TS 10/24: Right hip arthroplasty; I&D of left auricular hematoma 10/27: MRI brain showed acute infarct, neuro consulted    Assessment and Plan: Paul Colon is a 89 year old male who presented with a right hip fracture status post fixation 10/24 and left ear hematoma status post drainage.  Assessment & Plan Hip fracture [facility]) S/p right hemiarthroplasty on 10/24.  Has not required any as needed pain meds. Will follow-up with PT recommendations and set family up for possible home health PT at the request. -Pain management: sch Tylenol, oxy as needed -PT/OT following Hyponatremia Chronic, baseline NA around 130. Na 132 today. Previous studies suggested SIADH.  -urine osmole studies pending -Continue salt tablet and fluid restriction per Nephro -Daily BMP Acute cerebral infarction [facility]) Prior CT head negative and unremarkable neuro exam suggesting stroke. However, at family request for MRI brain for further evaluation of weakness/AMS which showed right temporal lobe small acute infarct. TSH 2.526. lipid panel normal.  - neuro consult: neuro checks, TTE, CTA head and neck, SLP, swallow screen, EEG, CXR, telemetry for arrhythmia - Aspirin 81mg  daily along with plavix 75mg  daily x 21 days, followed by Aspirin 81mg  daily alone. Added on plavix.  - aim for gradual normotension. BP elevated at 183/80 this morning however normotensive yesterday, monitor BP.  Hematoma of left auricular region S/p drainage on 10/24.  Stable -Continue Keflex 500 mg BID until last dose on 10/30 per ENT -Stopped Cipro iso AMS and suspected involvement of abx -mupirocin BID until  bolster removal 11/01 by ENT, if pt is discharged before then notify ENT for outpatient appt T2DM (type 2 diabetes mellitus) (HCC) BGL consistently in 200s. A1c 8.4.  -sSSI q4h -home metformin held Cognitive decline Family reports pt is not at baseline and pt has had short term memory loss x 6 mo. Hyponatremia improving. MRI shows acute stroke however neurology reports stroke is incidental and does not explain confusion/delirium.  -Discontinuing ciprofloxacin which may cause AMS -Delirium and fall precautions Chest x-ray abnormality Evidence of left base airspace opacity on chest x-ray, unknown if atelectasis or dressing infection/inflammation.  No evidence of oxygen desaturation or dyspnea. Afebrile, WBC mildly elevated at 11.3 iso s/p surgery.  - Will continue to monitor.   - Incentive spirometry and pulmonary toilet postoperatively.  - Repeat CXR pending.  Chronic and Stable Problems:  Anemia: Hb at baseline. Normocytic. Ferritin 1 year ago 307.  No active bleeding, patient is out of age range for colonoscopy. Chronic kidney disease stage IIIa: Creatinine stable HLD: Continue statin BPH: continue finasteride Cataracts: Continue timolol, difluprednate, and muro.   FEN/GI: Carb modified diet PPx: Lovenox Dispo: Possibly home health pending clinical improvement  Subjective:  Pt's son-in-law Paul Colon at bedside who reports pt is more confused this morning talking about "shopping." Pt denies pain. Paul Colon also reported pt attempted to pull out Foley twice overnight and new Foley was placed. He reports neurology came and reassured him that the acute stroke likely did not significantly contribute to pt's current AMS.   Objective: Temp:  [98 F (36.7 C)-98.3 F (36.8 C)] 98 F (36.7 C) (10/27 2259) Pulse Rate:  [62-68] 62 (10/27 2259) Resp:  [17] 17 (10/27  1426) BP: (124-127)/(60-97) 124/97 (10/27 2259) SpO2:  [98 %-99 %] 99 % (10/27 2259) Physical Exam: General: Elderly appearing male  resting comfortably in bed.  No acute distress. Answering questions appropriately.  HEENT: Bandage covering left ear, no significant swelling. Hard of hearing.  Cardiovascular: RRR without murmur Respiratory: CTAB.  Normal work of breathing on room air Abdomen: Soft, nondistended, nontender Extremities: No peripheral edema MSK: Bandage in place over right hip without drainage or surrounding erythema noted.  Distal motor function intact. Distal pulses intact bilaterally.   Laboratory: Most recent CBC Lab Results  Component Value Date   WBC 11.3 (H) 12/09/2022   HGB 10.2 (L) 12/09/2022   HCT 30.4 (L) 12/09/2022   MCV 91.3 12/09/2022   PLT 317 12/09/2022   Most recent BMP    Latest Ref Rng & Units 12/09/2022   10:52 PM  BMP  Glucose 70 - 99 mg/dL 161   BUN 8 - 23 mg/dL 28   Creatinine 0.96 - 1.24 mg/dL 0.45   Sodium 409 - 811 mmol/L 128   Potassium 3.5 - 5.1 mmol/L 4.3   Chloride 98 - 111 mmol/L 96   CO2 22 - 32 mmol/L 23   Calcium 8.9 - 10.3 mg/dL 7.8     Imaging/Diagnostic Tests: MRI brain IMPRESSION: 1. Punctate focus of restricted diffusion in the right temporal lobe medially subjacent to the lateral ventricle compatible with a small acute infarct. 2. Moderate generalized atrophy and white matter disease likely reflects the sequela of chronic microvascular ischemia. 3. Remote lacunar infarct of the right lentiform nucleus. 4. Right mastoid effusion. No obstructing nasopharyngeal lesion is present.   Ronney Lion, Medical Student 12/10/2022, 8:03 AM  [facility] FPTS Intern pager: [phone], text pages welcome Secure chat group [facility] Teaching Service    I agree with the assessment and plan as documented in the student's note.  Janeal Holmes, MD                  12/10/2022, 12:49 PM

## 2022-12-10 NOTE — Assessment & Plan Note (Addendum)
S/p drainage on 10/24.  Stable -Continue Keflex 500 mg BID until last dose on 10/30 per ENT -Stopped Cipro iso AMS and suspected involvement of abx -mupirocin BID until bolster removal 11/01 by ENT, if pt is discharged before then notify ENT for outpatient appt

## 2022-12-10 NOTE — Progress Notes (Signed)
EEG complete - results pending 

## 2022-12-10 NOTE — Progress Notes (Signed)
STROKE TEAM PROGRESS NOTE   SUBJECTIVE (INTERVAL HISTORY) His caregiver is at the bedside.  Overall his condition is rapidly improving. He was sitting in bed and eating breakfast with assistance. Per his caregiver, pt confusion has improved and more awake alert this am.   OBJECTIVE Temp:  [98 F (36.7 C)-99.3 F (37.4 C)] 99.3 F (37.4 C) (10/28 1554) Pulse Rate:  [62-69] 64 (10/28 1554) Cardiac Rhythm: Normal sinus rhythm (10/28 1723) Resp:  [16-18] 18 (10/28 1554) BP: (124-183)/(59-97) 156/59 (10/28 1554) SpO2:  [96 %-99 %] 96 % (10/28 1554)  Recent Labs  Lab 12/10/22 0024 12/10/22 0436 12/10/22 0740 12/10/22 1148 12/10/22 1640  GLUCAP 171* 202* 175* 155* 157*   Recent Labs  Lab 12/08/22 0333 12/08/22 1837 12/09/22 0755 12/09/22 2252 12/10/22 0825  NA 127* 127* 130* 128* 132*  K 4.6 4.3 4.4 4.3 3.8  CL 91* 94* 94* 96* 93*  CO2 24 23 24 23 27   GLUCOSE 284* 267* 166* 189* 155*  BUN 28* 34* 29* 28* 18  CREATININE 1.42* 1.38* 1.28* 0.98 0.90  CALCIUM 8.5* 8.1* 8.4* 7.8* 8.6*   No results for input(s): "AST", "ALT", "ALKPHOS", "BILITOT", "PROT", "ALBUMIN" in the last 168 hours. Recent Labs  Lab 12/05/22 1835 12/06/22 0814 12/07/22 0830 12/08/22 0333 12/09/22 0755  WBC 11.5* 10.3 10.9* 11.8* 11.3*  NEUTROABS 8.7*  --   --   --   --   HGB 11.5* 11.2* 11.1* 10.4* 10.2*  HCT 34.2* 31.7* 32.3* 30.1* 30.4*  MCV 90.5 88.3 88.3 88.0 91.3  PLT 290 292 263 287 317   No results for input(s): "CKTOTAL", "CKMB", "CKMBINDEX", "TROPONINI" in the last 168 hours. No results for input(s): "LABPROT", "INR" in the last 72 hours. Recent Labs    12/10/22 0833  COLORURINE YELLOW  LABSPEC 1.015  PHURINE 6.0  GLUCOSEU 150*  HGBUR NEGATIVE  BILIRUBINUR NEGATIVE  KETONESUR 5*  PROTEINUR NEGATIVE  NITRITE NEGATIVE  LEUKOCYTESUR NEGATIVE       Component Value Date/Time   CHOL 110 12/10/2022 0825   TRIG 48 12/10/2022 0825   HDL 41 12/10/2022 0825   CHOLHDL 2.7 12/10/2022  0825   VLDL 10 12/10/2022 0825   LDLCALC 59 12/10/2022 0825   Lab Results  Component Value Date   HGBA1C 8.4 (H) 12/06/2022   No results found for: "LABOPIA", "COCAINSCRNUR", "LABBENZ", "AMPHETMU", "THCU", "LABBARB"  No results for input(s): "ETH" in the last 168 hours.  I have personally reviewed the radiological images below and agree with the radiology interpretations.  EEG adult  Result Date: 12/10/2022 Charlsie Quest, MD     12/10/2022  5:17 PM Patient Name: Paul Colon MRN: [id number] Epilepsy Attending: Charlsie Quest Referring Physician/Provider: Marvel Plan, MD Date: 12/10/2022 Duration: 24.28 mins Patient history: 89 y.o. male admitted with R hip fx and confused and off from his baseline. EEG to evaluate for seizure Level of alertness: Awake, asleep AEDs during EEG study: None Technical aspects: This EEG study was done with scalp electrodes positioned according to the 10-20 International system of electrode placement. Electrical activity was reviewed with band pass filter of 1-70Hz , sensitivity of 7 uV/mm, display speed of 50mm/sec with a 60Hz  notched filter applied as appropriate. EEG data were recorded continuously and digitally stored.  Video monitoring was available and reviewed as appropriate. Description: The posterior dominant rhythm consists of 7 Hz activity of moderate voltage (25-35 uV) seen predominantly in posterior head regions, symmetric and reactive to eye opening and eye closing. Sleep was  characterized by vertex waves, sleep spindles (12 to 14 Hz), maximal frontocentral region. EEG showed continuous generalized 6-7 Hz theta slowing. Physiologic photic driving was not seen during photic stimulation.  Hyperventilation was not performed.   ABNORMALITY - Continuous slow, generalized - Background slow IMPRESSION: This study is suggestive of mild diffuse encephalopathy. No seizures or epileptiform discharges were seen throughout the recording. Charlsie Quest   CT ANGIO  HEAD NECK W WO CM  Result Date: 12/10/2022 CLINICAL DATA:  Provided history: Stroke/TIA, assess for intracardiac shunt. EXAM: CT ANGIOGRAPHY HEAD AND NECK WITH AND WITHOUT CONTRAST TECHNIQUE: Multidetector CT imaging of the head and neck was performed using the standard protocol during bolus administration of intravenous contrast. Multiplanar CT image reconstructions and MIPs were obtained to evaluate the vascular anatomy. Carotid stenosis measurements (when applicable) are obtained utilizing NASCET criteria, using the distal internal carotid diameter as the denominator. RADIATION DOSE REDUCTION: This exam was performed according to the departmental dose-optimization program which includes automated exposure control, adjustment of the mA and/or kV according to patient size and/or use of iterative reconstruction technique. CONTRAST:  75mL OMNIPAQUE IOHEXOL 350 MG/ML SOLN COMPARISON:  Brain MRI 12/09/2022.  Head CT 12/05/2022. FINDINGS: CT HEAD FINDINGS Brain: Generalized cerebral atrophy. A known small acute infarct within the right temporal lobe is occult by CT and was better appreciated on the prior brain MRI of 12/09/2022. Background mild patchy and ill-defined hypoattenuation within the cerebral white matter, nonspecific but compatible with chronic small vessel ischemic disease. Chronic lacunar infarct within the right lentiform nucleus, better appreciated on yesterday's brain MRI. There is no acute intracranial hemorrhage. No extra-axial fluid collection. No evidence of an intracranial mass. No midline shift. Vascular: No hyperdense vessel.  Atherosclerotic calcifications. Skull: No calvarial fracture or aggressive osseous lesion. Sinuses/Orbits: No orbital mass or acute orbital finding. No significant paranasal sinus disease. Other: Trace fluid within the right mastoid air cells. Swelling of the left ear with overlying dressing. Review of the MIP images confirms the above findings CTA NECK FINDINGS Aortic  arch: Standard aortic branching. Atherosclerotic plaque within the visualized thoracic aorta and proximal major branch vessels of the neck. Focal 70% narrowing of the proximal right subclavian artery which appears secondary to a combination of vessel tortuosity and atherosclerotic plaque (series 13, image 43). Streak/beam hardening artifact arising from a dense left-sided contrast bolus partially obscures the left subclavian artery. Within this limitation, there is no appreciable hemodynamically significant innominate or proximal left subclavian artery stenosis. Right carotid system: CCA and ICA patent within the neck without stenosis. Mild atherosclerotic plaque about the carotid bifurcation. Left carotid system: CCA and ICA patent within the neck without stenosis. Minimal atherosclerotic plaque within the proximal ICA. Vertebral arteries: Codominant and patent within the neck without stenosis or significant atherosclerotic disease. Skeleton: Dextrocurvature of the cervical spine. Levocurvature of the upper thoracic spine. Spondylosis of the cervical and visualized upper thoracic levels. Multilevel vertebral ankylosis within the cervical spine. Schmorl nodes within the T4 superior endplate. Other neck: Subcentimeter nodule within the right thyroid lobe not meeting consensus criteria for ultrasound follow-up based on size. Upper chest: No consolidation within the imaged lung apices. Review of the MIP images confirms the above findings CTA HEAD FINDINGS Anterior circulation: The intracranial internal carotid arteries are patent. Non-stenotic atherosclerotic plaque within both vessels. The M1 middle cerebral arteries are patent. No M2 proximal branch occlusion or high-grade proximal stenosis. The anterior cerebral arteries are patent. No intracranial aneurysm is identified. Posterior circulation: The intracranial vertebral  arteries are patent. The basilar artery is patent. The posterior cerebral arteries are patent.  Venous sinuses: Within the limitations of contrast timing, no convincing thrombus. Anatomic variants: As described. Review of the MIP images confirms the above findings IMPRESSION: CT head: 1. A known small acute right temporal lobe infarct is occult by CT and was better appreciated on yesterday's brain MRI. 2. Background parenchymal atrophy and chronic small vessel ischemic disease, as described. CTA neck: 1. The common carotid and internal carotid arteries are patent within the neck without stenosis. Mild atherosclerotic plaque bilaterally, as described. 2. The vertebral arteries are patent within the neck without stenosis or significant atherosclerotic disease. 3. 70% narrowing of the proximal right subclavian artery, which appears secondary to a combination of vessel tortuosity and atherosclerotic plaque. 4. Aortic Atherosclerosis (ICD10-I70.0). 5. Please note the heart is excluded from the field of view on this examination, precluding evaluation for an intracardiac shunt. Dedicated cardiac imaging may be obtained for further evaluation, as warranted. CTA head: 1. No intracranial large vessel occlusion or proximal high-grade arterial stenosis. 2. Non-stenotic atherosclerotic plaque within the intracranial ICAs. Electronically Signed   By: Jackey Loge D.O.   On: 12/10/2022 16:28   DG CHEST PORT 1 VIEW  Result Date: 12/10/2022 CLINICAL DATA:  Leukocytosis. EXAM: PORTABLE CHEST 1 VIEW COMPARISON:  December 05, 2022. FINDINGS: The heart size and mediastinal contours are within normal limits. Left lung is clear. Minimal right basilar subsegmental atelectasis or scarring is noted. The visualized skeletal structures are unremarkable. IMPRESSION: Minimal right basilar subsegmental atelectasis or scarring. Electronically Signed   By: Lupita Raider M.D.   On: 12/10/2022 12:59   ECHOCARDIOGRAM COMPLETE  Result Date: 12/10/2022    ECHOCARDIOGRAM REPORT   Patient Name:   Paul Colon Date of Exam: 12/10/2022  Medical Rec #:  [id number]    Height:       71.0 in Accession #:    [id number]   Weight:       146.0 lb Date of Birth:  30-Jan-1930    BSA:          1.845 m Patient Age:    89 years     BP:           183/80 mmHg Patient Gender: M            HR:           64 bpm. Exam Location:  Inpatient Procedure: 2D Echo, Cardiac Doppler and Color Doppler Indications:    Stroke I63.9  History:        Patient has prior history of Echocardiogram examinations, most                 recent 01/22/2022. Stroke; Risk Factors:Hypertension, Diabetes,                 Dyslipidemia and Non-Smoker.  Sonographer:    Dondra Prader RVT RCS Referring Phys: [id number] MARGARET E PRAY  Sonographer Comments: Technically challenging study due to limited acoustic windows. Supine position, Unable to accurately measure the atria. IMPRESSIONS  1. Left ventricular ejection fraction, by estimation, is 55 to 60%. The left ventricle has normal function. The left ventricle has no regional wall motion abnormalities. There is moderate asymmetric left ventricular hypertrophy of the basal-septal segment. Left ventricular diastolic parameters are indeterminate.  2. Right ventricular systolic function is mildly reduced. The right ventricular size is normal.  3. The mitral valve is normal in structure. Mild mitral valve regurgitation.  4. The  aortic valve is calcified. Aortic valve regurgitation is mild. Aortic valve sclerosis/calcification is present, without any evidence of aortic stenosis. Comparison(s): No significant change from prior study. FINDINGS  Left Ventricle: Left ventricular ejection fraction, by estimation, is 55 to 60%. The left ventricle has normal function. The left ventricle has no regional wall motion abnormalities. The left ventricular internal cavity size was normal in size. There is  moderate asymmetric left ventricular hypertrophy of the basal-septal segment. Left ventricular diastolic parameters are indeterminate. Right Ventricle: The right  ventricular size is normal. No increase in right ventricular wall thickness. Right ventricular systolic function is mildly reduced. Left Atrium: Left atrial size was normal in size. Right Atrium: Right atrial size was normal in size. Pericardium: There is no evidence of pericardial effusion. Mitral Valve: The mitral valve is normal in structure. Mild mitral valve regurgitation. Tricuspid Valve: The tricuspid valve is normal in structure. Tricuspid valve regurgitation is mild. Aortic Valve: The aortic valve is calcified. Aortic valve regurgitation is mild. Aortic valve sclerosis/calcification is present, without any evidence of aortic stenosis. Aortic valve mean gradient measures 3.0 mmHg. Aortic valve peak gradient measures 5.8 mmHg. Aortic valve area, by VTI measures 3.37 cm. Pulmonic Valve: The pulmonic valve was grossly normal. Pulmonic valve regurgitation is not visualized. Aorta: The aortic root and ascending aorta are structurally normal, with no evidence of dilitation. IAS/Shunts: No atrial level shunt detected by color flow Doppler.  LEFT VENTRICLE PLAX 2D LVIDd:         3.90 cm   Diastology LVIDs:         2.60 cm   LV e' medial:    6.39 cm/s LV PW:         1.10 cm   LV E/e' medial:  11.7 LV IVS:        1.00 cm   LV e' lateral:   7.90 cm/s LVOT diam:     2.10 cm   LV E/e' lateral: 9.4 LV SV:         76 LV SV Index:   41 LVOT Area:     3.46 cm  RIGHT VENTRICLE            IVC RV S prime:     9.14 cm/s  IVC diam: 1.20 cm TAPSE (M-mode): 1.7 cm LEFT ATRIUM         Index       RIGHT ATRIUM          Index LA diam:    2.70 cm 1.46 cm/m  RA Area:     8.57 cm                                 RA Volume:   14.00 ml 7.59 ml/m  AORTIC VALVE                    PULMONIC VALVE AV Area (Vmax):    3.21 cm     PV Vmax:       0.98 m/s AV Area (Vmean):   3.09 cm     PV Peak grad:  3.8 mmHg AV Area (VTI):     3.37 cm AV Vmax:           120.50 cm/s AV Vmean:          80.200 cm/s AV VTI:            0.225 m AV Peak Grad:  5.8 mmHg AV Mean Grad:      3.0 mmHg LVOT Vmax:         111.67 cm/s LVOT Vmean:        71.567 cm/s LVOT VTI:          0.219 m LVOT/AV VTI ratio: 0.97  AORTA Ao Root diam: 3.60 cm MITRAL VALVE               TRICUSPID VALVE MV Area (PHT): 2.73 cm    TR Peak grad:   21.0 mmHg MV Decel Time: 278 msec    TR Vmax:        229.00 cm/s MV E velocity: 74.60 cm/s MV A velocity: 92.50 cm/s  SHUNTS MV E/A ratio:  0.81        Systemic VTI:  0.22 m                            Systemic Diam: 2.10 cm Clearnce Hasten Electronically signed by Clearnce Hasten Signature Date/Time: 12/10/2022/11:03:20 AM    Final    MR BRAIN WO CONTRAST  Result Date: 12/09/2022 CLINICAL DATA:  Delirium.  Fall 4 days ago. EXAM: MRI HEAD WITHOUT CONTRAST TECHNIQUE: Multiplanar, multiecho pulse sequences of the brain and surrounding structures were obtained without intravenous contrast. COMPARISON:  CT head without contrast 12/05/2022 FINDINGS: Brain: The punctate focus of restricted diffusion is present in the right temporal lobe medially subjacent to the lateral ventricle. Moderate generalized atrophy is present. Mild periventricular T2 hyperintensities are present bilaterally. A remote lacunar infarct is present in the right lentiform nucleus. The deep brain nuclei are otherwise within normal limits. No significant extra-axial fluid collections are present. The brainstem and cerebellum are within normal limits. Midline structures are within normal limits. The internal auditory canals are within normal limits. Vascular: Flow is present in the major intracranial arteries. Skull and upper cervical spine: The craniocervical junction is normal. Upper cervical spine is within normal limits. Marrow signal is unremarkable. Sinuses/Orbits: A right mastoid effusion is present. No obstructing nasopharyngeal lesion is present. The paranasal sinuses and mastoid air cells are otherwise clear. Right lens replacement is noted. The globes and orbits are otherwise  within normal limits. IMPRESSION: 1. Punctate focus of restricted diffusion in the right temporal lobe medially subjacent to the lateral ventricle compatible with a small acute infarct. 2. Moderate generalized atrophy and white matter disease likely reflects the sequela of chronic microvascular ischemia. 3. Remote lacunar infarct of the right lentiform nucleus. 4. Right mastoid effusion. No obstructing nasopharyngeal lesion is present. Electronically Signed   By: Marin Roberts M.D.   On: 12/09/2022 18:01   DG Pelvis Portable  Result Date: 12/07/2022 CLINICAL DATA:  Right femoral neck fracture, postoperative EXAM: PORTABLE PELVIS 1-2 VIEWS COMPARISON:  12/05/2022 FINDINGS: Right hip hemiarthroplasty in place with expected small amount of gas in the adjacent soft tissues and skin staples. No periprosthetic fracture or early complicating feature identified. Patient has a left total hip prosthesis. IMPRESSION: 1. Right hip hemiarthroplasty in place without early complicating feature identified. Electronically Signed   By: Gaylyn Rong M.D.   On: 12/07/2022 19:11   DG HIP UNILAT WITH PELVIS 1V RIGHT  Result Date: 12/07/2022 CLINICAL DATA:  Hip hemiarthroplasty EXAM: Intraoperative fluoroscopy COMPARISON:  Preop x-ray 12/05/2022. FINDINGS: Five fluoroscopic spot images submitted for review demonstrate progressive placement of a hemiarthroplasty of the hip. Expected alignment. Imaging was obtained to aid in treatment. Please correlate with real-time fluoroscopy  of 7.5 seconds. Cumulative air kerma 0.6975. IMPRESSION: Intraoperative fluoroscopy. Electronically Signed   By: Karen Kays M.D.   On: 12/07/2022 17:25   DG C-Arm 1-60 Min-No Report  Result Date: 12/07/2022 Fluoroscopy was utilized by the requesting physician.  No radiographic interpretation.   DG Knee Right Port  Result Date: 12/06/2022 CLINICAL DATA:  Closed displaced fracture right femoral neck. EXAM: PORTABLE RIGHT KNEE - 1-2  VIEW COMPARISON:  None Available. FINDINGS: Mild tricompartmental joint space narrowing. Minimal inferior patellar degenerative spurring. Minimal lateral compartment chondrocalcinosis. No joint effusion. No acute fracture or dislocation. There is a mildly lobular predominantly sclerotic lesion with narrow zone of transition overlying the slightly lateral aspect of the distal femoral diaphysis on frontal view, measuring up to approximately 1.9 x 2.7 cm (transverse by AP). On lateral view this has a more lucent center with thin sclerotic well-circumscribed border and measures up to approximately 1.8 cm in AP dimension, abutting the posterior cortex. 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 IMPRESSION: 1. Mild tricompartmental osteoarthritis. 2. No acute fracture. 3. 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 Electronically Signed   By: Neita Garnet M.D.   On: 12/06/2022 09:55   CT HEAD WO CONTRAST ( )  Result Date: 12/05/2022 CLINICAL DATA:  Head trauma, minor (Age >= 65y) fall Pt bib ems for a fall. Pt was getting out of the car, lost his balance and fell onto his right hip, no LOC, did not hit his head, no blood thinners. EXAM: CT HEAD WITHOUT CONTRAST TECHNIQUE: Contiguous axial images were obtained from the base of the skull through the vertex without intravenous contrast. RADIATION DOSE REDUCTION: This exam was performed according to the departmental dose-optimization program which includes automated exposure control, adjustment of the mA and/or kV according to patient size and/or use of iterative reconstruction technique. COMPARISON:  CT head 08/24/2022 FINDINGS: Brain: Cerebral ventricle sizes are concordant with the degree of cerebral volume loss. Patchy and confluent areas of decreased attenuation are noted throughout the deep and periventricular white matter of the cerebral hemispheres bilaterally,  compatible with chronic microvascular ischemic disease. No evidence of large-territorial acute infarction. No parenchymal hemorrhage. No mass lesion. No extra-axial collection. No mass effect or midline shift. No hydrocephalus. Basilar cisterns are patent. Vascular: No hyperdense vessel. Atherosclerotic calcifications are present within the cavernous internal carotid arteries. Skull: No acute fracture or focal lesion. Sinuses/Orbits: Paranasal sinuses and mastoid air cells are clear. Right lens replacement. The orbits are unremarkable. Other: None. Electronically Signed   By: Tish Frederickson M.D.   On: 12/05/2022 19:50   DG Chest 2 View  Result Date: 12/05/2022 CLINICAL DATA:  fall, R hip injury EXAM: CHEST - 2 VIEW COMPARISON:  Chest x-ray 10/06/2022 FINDINGS: The heart and mediastinal contours are unchanged. Atherosclerotic plaque. Vague left base airspace opacity. No pulmonary edema. No pleural effusion. No pneumothorax. No acute osseous abnormality. IMPRESSION: 1. Vague left base airspace opacity. Finding may represent atelectasis versus developing infection/inflammation. 2.  Aortic Atherosclerosis (ICD10-I70.0). Electronically Signed   By: Tish Frederickson M.D.   On: 12/05/2022 19:48   DG Hip Unilat W or Wo Pelvis 2-3 Views Right  Result Date: 12/05/2022 CLINICAL DATA:  fall, R hip injury EXAM: DG HIP (WITH OR WITHOUT PELVIS) 2-3V RIGHT COMPARISON:  X-ray left hip 01/20/2022 FINDINGS: Total left hip arthroplasty. No radiographic findings to suggest surgical hardware complication. Acute displaced right femoral neck fracture. No dislocation of either hips. No  acute displaced fracture or diastasis of the bones of the pelvis. There is no evidence of arthropathy or other focal bone abnormality. IMPRESSION: Acute displaced right femoral neck fracture. Electronically Signed   By: Tish Frederickson M.D.   On: 12/05/2022 19:46     PHYSICAL EXAM  Temp:  [98 F (36.7 C)-99.3 F (37.4 C)] 99.3 F (37.4 C)  (10/28 1554) Pulse Rate:  [62-69] 64 (10/28 1554) Resp:  [16-18] 18 (10/28 1554) BP: (124-183)/(59-97) 156/59 (10/28 1554) SpO2:  [96 %-99 %] 96 % (10/28 1554)  General - Well nourished, well developed, in no apparent distress.  Ophthalmologic - fundi not visualized due to noncooperation.  Cardiovascular - Regular rhythm and rate.  Neuro - awake, alert, eyes open, orientated to people. Stated age 19 instead of 32, not orientated to place and time. No aphasia, but paucity of speech, following one step simple commands but not two step commands. No gaze palsy, tracking bilaterally, blinking to visual threat bilaterally, PERRL. No facial droop. Tongue midline. Bilateral UEs 4/5, no drift. LLEs 3/5, no drift, RLE proximal not able to test due to pain from fracture, however, R ankle DF/PF 3/5. Sensation symmetrical bilaterally subjectively, b/l FTN intact not cooperative but no ataxia seen, gait not tested.     ASSESSMENT/PLAN Paul Colon is a 89 y.o. male with history of DM, dementia admitted for right hip fracture s/p hemiarthroplasty. Developed confusion and MRI showed R temporal horn periventricular punctate infarcts. No tPA given due to out of window.    Encephalopathy Confusion, improved Likely multifactorial including AKI, hyponatremia, delirium from dementia and cipro side effects Cre 1.38->1.28->0.98->0.90 Na 127->130->132 UA and CXR negative Will d/c Cipro Delirium precautions Management per primary team  Stroke, incidental finding:  right temporal horn periventricular small infarct, likely secondary to small vessel disease source MRI  Punctate focus of restricted diffusion in the right temporal lobe medially subjacent to the lateral ventricle compatible with a small acute infarct. CTA head and neck - 70% narrowing of the proximal right subclavian artery 2D Echo EF 55-60% LDL 59 HgbA1c 8.4 Lovenox for VTE prophylaxis aspirin 81 mg daily prior to admission, now on aspirin  81 mg daily and clopidogrel 75 mg daily DAPT for 3 weeks and then ASA alone.  Patient counseled to be compliant with his antithrombotic medications Ongoing aggressive stroke risk factor management Therapy recommendations:  HH PT and OT Disposition:  pending  Left ear hematoma S/p drainage and suture closure by ENT On Cipro now stopped Now on DAPT, close monitoring bleeding  Right hip fracture Status post hemiarthroplasty Orthopedic on board  Diabetes HgbA1c 8.4, goal < 7.0 Uncontrolled CBG monitoring SSI DM education and close PCP follow up  Hypertension Stable on the high and Long term BP goal normotensive  Hyperlipidemia Home meds: Lipitor 10 LDL 59, goal < 70 Now on Lipitor 20 No high intensity statin due to LDL at goal and advanced age Continue statin at discharge  Other Stroke Risk Factors Advanced age  Other Active Problems Dementia Leukocytosis WBC 11.8-11.3  Hospital day # 5  Neurology will sign off. Please call with questions. Pt will follow up with Dr. Everlena Cooper at [facility] in about 4 weeks. Thanks for the consult.   Marvel Plan, MD PhD Stroke Neurology 12/10/2022 7:00 PM    To contact Stroke Continuity provider, please refer to [email or web address]. After hours, contact General Neurology

## 2022-12-10 NOTE — Progress Notes (Signed)
*  PRELIMINARY RESULTS* Echocardiogram 2D Echocardiogram has been performed.  Paul Colon 12/10/2022, 10:29 AM

## 2022-12-10 NOTE — Progress Notes (Signed)
Physical Therapy Treatment Patient Details Name: Paul Colon MRN: [id number] DOB: 12-18-1929 Today's Date: 12/10/2022   History of Present Illness The pt is a 89 yo male presenting 10/23 after a fall onto R hip. Pt found to have acute displaced femoral neck fx He is now s/p R nailing on 12/07/22, workup for confusion,  MRI brain demonstrated a new small punctate R temporal lobe stroke .PMH includes: dementia, frequent falls, L femoral neck fx 01/2022, DM II, HLD, CKD, BPH, and anemia.    PT Comments  Continuing work on functional mobility and activity tolerance; initially planned for session to focus on progressive gait and stair training, however, patient had imminent needs for the bathroom, and we pivoted to focusing on getting to the bedside commode, working on standing tolerance and posture, while getting cleaned up, and taking steps, including turning steps during transferring bed to bedside commode, then bedside commode and taking a few steps to the recliner; patient shows overall good rise with sit to stand; he had difficulty with turning steps to the bedside commode and also to the recliner, tending to have the walker too far in front of him, and was difficulty pulling the walker back closer to his body; it was difficult today, but I anticipate activities like this are better with his family/ familiar caregivers;  This PT is still very much values getting the patient back home to his familiar environment, routine, and caregivers; at this time, though, we did not have the opportunity for stair training; and his caregivers were not available during this PT session; just in case, I left a an information sheet with instructions on how to go up and down stairs with the patient in the wheelchair with 2 person assist; if given the opportunity, I would like to be able to go over stairs with the patient's caregivers present    If plan is discharge home, recommend the following: Assistance with  cooking/housework;Help with stairs or ramp for entrance;A lot of help with walking and/or transfers;A lot of help with bathing/dressing/bathroom;Direct supervision/assist for medications management;Direct supervision/assist for financial management   Can travel by private vehicle        Equipment Recommendations  None recommended by PT    Recommendations for Other Services       Precautions / Restrictions Precautions Precautions: Fall Precaution Comments: HOH with R hearing aid Restrictions RLE Weight Bearing: Weight bearing as tolerated     Mobility  Bed Mobility Overal bed mobility: Needs Assistance Bed Mobility: Supine to Sit     Supine to sit: Mod assist, Used rails, +2 for physical assistance, HOB elevated     General bed mobility comments: Cues for bed rail use, pt assisting with pulling and moving LLE to EOB.    Transfers Overall transfer level: Needs assistance Equipment used: Rolling walker (2 wheels) Transfers: Sit to/from Stand, Bed to chair/wheelchair/BSC Sit to Stand: Min assist, Contact guard assist   Step pivot transfers: Mod assist       General transfer comment: Stood from the bed with min assist after scooting closer to EOB; Once standing, reported need to get to the bathroom, and pt was internally distracted by need to move bowels, resulting in difficulty attending to multimodal cueing for RW proximity, posture, and taking turning steps to the [facility]; Tending to keep RW too far ahead of him with bed to [facility], and BSC to recliner transfers    Ambulation/Gait  General Gait Details: Difficulty with amb this afternoon   Stairs             Wheelchair Mobility     Tilt Bed    Modified Rankin (Stroke Patients Only)       Balance     Sitting balance-Leahy Scale: Fair       Standing balance-Leahy Scale: Poor Standing balance comment: Reliant on ext support                            Cognition Arousal:  Alert Behavior During Therapy: WFL for tasks assessed/performed Overall Cognitive Status: History of cognitive impairments - at baseline                                 General Comments: pt able to follow simple commands with increased time and often cues repeated. Internally distracted by need to move bowels        Exercises      General Comments General comments (skin integrity, edema, etc.): Large BM; RN notified; Anticipate better ability to attend to directions given by a familiar caregiver      Pertinent Vitals/Pain Pain Assessment Pain Assessment: No/denies pain Pain Intervention(s): Monitored during session    Home Living                          Prior Function            PT Goals (current goals can now be found in the care plan section) Acute Rehab PT Goals Patient Stated Goal: per son-in-law: to return home with family when +1 assist PT Goal Formulation: With patient/family Time For Goal Achievement: 12/22/22 Potential to Achieve Goals: Good Progress towards PT goals: Progressing toward goals    Frequency    Min 1X/week      PT Plan      Co-evaluation              AM-PAC PT "6 Clicks" Mobility   Outcome Measure  Help needed turning from your back to your side while in a flat bed without using bedrails?: A Lot Help needed moving from lying on your back to sitting on the side of a flat bed without using bedrails?: A Lot Help needed moving to and from a bed to a chair (including a wheelchair)?: A Little Help needed standing up from a chair using your arms (e.g., wheelchair or bedside chair)?: A Little Help needed to walk in hospital room?: Total Help needed climbing 3-5 steps with a railing? : Total 6 Click Score: 12    End of Session Equipment Utilized During Treatment: Gait belt Activity Tolerance: Patient tolerated treatment well Patient left: in chair;with call bell/phone within reach;with chair alarm set Nurse  Communication: Mobility status PT Visit Diagnosis: Unsteadiness on feet (R26.81);Other abnormalities of gait and mobility (R26.89);Muscle weakness (generalized) (M62.81);Difficulty in walking, not elsewhere classified (R26.2);Pain Pain - Right/Left: Left Pain - part of body: Hip     Time: 4540-9811 PT Time Calculation (min) (ACUTE ONLY): 19 min  Charges:    $Therapeutic Activity: 8-22 mins PT General Charges $$ ACUTE PT VISIT: 1 Visit                     Van Clines, PT  Acute Rehabilitation Services Office [phone] Secure Chat welcomed    Levi Aland  12/10/2022, 5:16 PM

## 2022-12-10 NOTE — Plan of Care (Signed)
Problem: Education: Goal: Knowledge of General Education information will improve Description: Including pain rating scale, medication(s)/side effects and non-pharmacologic comfort measures Outcome: Progressing   Problem: Health Behavior/Discharge Planning: Goal: Ability to manage health-related needs will improve Outcome: Progressing   Problem: Clinical Measurements: Goal: Ability to maintain clinical measurements within normal limits will improve Outcome: Progressing Goal: Will remain free from infection Outcome: Progressing Goal: Diagnostic test results will improve Outcome: Progressing Goal: Respiratory complications will improve Outcome: Progressing Goal: Cardiovascular complication will be avoided Outcome: Progressing   Problem: Activity: Goal: Risk for activity intolerance will decrease Outcome: Progressing   Problem: Nutrition: Goal: Adequate nutrition will be maintained Outcome: Progressing   Problem: Coping: Goal: Level of anxiety will decrease Outcome: Progressing   Problem: Elimination: Goal: Will not experience complications related to bowel motility Outcome: Progressing Goal: Will not experience complications related to urinary retention Outcome: Progressing   Problem: Pain Management: Goal: General experience of comfort will improve Outcome: Progressing   Problem: Safety: Goal: Ability to remain free from injury will improve Outcome: Progressing   Problem: Skin Integrity: Goal: Risk for impaired skin integrity will decrease Outcome: Progressing   Problem: Education: Goal: Ability to describe self-care measures that may prevent or decrease complications (Diabetes Survival Skills Education) will improve Outcome: Progressing Goal: Individualized Educational Video(s) Outcome: Progressing   Problem: Coping: Goal: Ability to adjust to condition or change in health will improve Outcome: Progressing   Problem: Fluid Volume: Goal: Ability to  maintain a balanced intake and output will improve Outcome: Progressing   Problem: Health Behavior/Discharge Planning: Goal: Ability to identify and utilize available resources and services will improve Outcome: Progressing Goal: Ability to manage health-related needs will improve Outcome: Progressing   Problem: Metabolic: Goal: Ability to maintain appropriate glucose levels will improve Outcome: Progressing   Problem: Nutritional: Goal: Maintenance of adequate nutrition will improve Outcome: Progressing Goal: Progress toward achieving an optimal weight will improve Outcome: Progressing   Problem: Skin Integrity: Goal: Risk for impaired skin integrity will decrease Outcome: Progressing   Problem: Tissue Perfusion: Goal: Adequacy of tissue perfusion will improve Outcome: Progressing   Problem: Education: Goal: Verbalization of understanding the information provided (i.e., activity precautions, restrictions, etc) will improve Outcome: Progressing Goal: Individualized Educational Video(s) Outcome: Progressing   Problem: Activity: Goal: Ability to ambulate and perform ADLs will improve Outcome: Progressing   Problem: Clinical Measurements: Goal: Postoperative complications will be avoided or minimized Outcome: Progressing   Problem: Self-Concept: Goal: Ability to maintain and perform role responsibilities to the fullest extent possible will improve Outcome: Progressing   Problem: Pain Management: Goal: Pain level will decrease Outcome: Progressing   Problem: Education: Goal: Knowledge of disease or condition will improve Outcome: Progressing Goal: Knowledge of secondary prevention will improve (MUST DOCUMENT ALL) Outcome: Progressing Goal: Knowledge of patient specific risk factors will improve Loraine Leriche N/A or DELETE if not current risk factor) Outcome: Progressing   Problem: Ischemic Stroke/TIA Tissue Perfusion: Goal: Complications of ischemic stroke/TIA will be  minimized Outcome: Progressing   Problem: Coping: Goal: Will verbalize positive feelings about self Outcome: Progressing Goal: Will identify appropriate support needs Outcome: Progressing   Problem: Health Behavior/Discharge Planning: Goal: Ability to manage health-related needs will improve Outcome: Progressing Goal: Goals will be collaboratively established with patient/family Outcome: Progressing   Problem: Self-Care: Goal: Ability to participate in self-care as condition permits will improve Outcome: Progressing Goal: Verbalization of feelings and concerns over difficulty with self-care will improve Outcome: Progressing Goal: Ability to communicate needs accurately will improve Outcome:  Progressing   Problem: Nutrition: Goal: Risk of aspiration will decrease Outcome: Progressing Goal: Dietary intake will improve Outcome: Progressing

## 2022-12-10 NOTE — Procedures (Signed)
Patient Name: Paul Colon  MRN: [id number]  Epilepsy Attending: Charlsie Quest  Referring Physician/Provider: Marvel Plan, MD  Date: 12/10/2022 Duration: 24.28 mins  Patient history: 89 y.o. male admitted with R hip fx and confused and off from his baseline. EEG to evaluate for seizure  Level of alertness: Awake, asleep  AEDs during EEG study: None  Technical aspects: This EEG study was done with scalp electrodes positioned according to the 10-20 International system of electrode placement. Electrical activity was reviewed with band pass filter of 1-70Hz , sensitivity of 7 uV/mm, display speed of 83mm/sec with a 60Hz  notched filter applied as appropriate. EEG data were recorded continuously and digitally stored.  Video monitoring was available and reviewed as appropriate.  Description: The posterior dominant rhythm consists of 7 Hz activity of moderate voltage (25-35 uV) seen predominantly in posterior head regions, symmetric and reactive to eye opening and eye closing. Sleep was characterized by vertex waves, sleep spindles (12 to 14 Hz), maximal frontocentral region. EEG showed continuous generalized 6-7 Hz theta slowing. Physiologic photic driving was not seen during photic stimulation.  Hyperventilation was not performed.     ABNORMALITY - Continuous slow, generalized - Background slow  IMPRESSION: This study is suggestive of mild diffuse encephalopathy. No seizures or epileptiform discharges were seen throughout the recording.  Paul Colon

## 2022-12-10 NOTE — Assessment & Plan Note (Addendum)
Chronic, baseline NA around 130. Na 132 today. Previous studies suggested SIADH.  -urine osmole studies pending -Continue salt tablet and fluid restriction per Nephro -Daily BMP

## 2022-12-10 NOTE — Assessment & Plan Note (Addendum)
Evidence of left base airspace opacity on chest x-ray, unknown if atelectasis or dressing infection/inflammation.  No evidence of oxygen desaturation or dyspnea. Afebrile, WBC mildly elevated at 11.3 iso s/p surgery.  - Will continue to monitor.   - Incentive spirometry and pulmonary toilet postoperatively.  - Repeat CXR pending.

## 2022-12-10 NOTE — Assessment & Plan Note (Addendum)
S/p right hemiarthroplasty on 10/24.  Has not required any as needed pain meds. Will follow-up with PT recommendations and set family up for possible home health PT at the request. -Pain management: sch Tylenol, oxy as needed -PT/OT following

## 2022-12-10 NOTE — Consult Note (Signed)
NEUROLOGY CONSULT NOTE   Date of service: December 10, 2022 Patient Name: Paul Colon MRN:  [id number] DOB:  09-26-1929 Chief Complaint: "small incidental stroke on MRI" Requesting Provider: Billey Co, MD  History of Present Illness  Paul Colon is a 89 y.o. male with hx of DM2, cognitive decline and severe dementia, CKD 3A admitted with R hip fracture s/p hemiarthroplasty who had MRI Brain for workup of confusion which demonstrated a small punctate R temporal lobe stroke.  No symptoms that localize to the stroke.  LKW: unclear, since no symptoms that can be attributed to the stroke. Modified rankin score: 3-Moderate disability-requires help but walks WITHOUT assistance IV Thrombolysis: not offered, 2/2 no symptoms that can be attributed to the stroke. EVT: not offered, no LVO.  NIHSS components Score: Comment  1a Level of Conscious 0[x]  1[]  2[]  3[]      1b LOC Questions 0[]  1[]  2[x]       1c LOC Commands 0[x]  1[]  2[]       2 Best Gaze 0[x]  1[]  2[]       3 Visual 0[x]  1[]  2[]  3[]      4 Facial Palsy 0[x]  1[]  2[]  3[]      5a Motor Arm - left 0[x]  1[]  2[]  3[]  4[]  UN[]    5b Motor Arm - Right 0[x]  1[]  2[]  3[]  4[]  UN[]    6a Motor Leg - Left 0[x]  1[]  2[]  3[]  4[]  UN[]    6b Motor Leg - Right 0[]  1[]  2[]  3[]  4[x]  UN[]  R hip surgery  7 Limb Ataxia 0[x]  1[]  2[]  3[]  UN[]     8 Sensory 0[x]  1[]  2[]  UN[]      9 Best Language 0[x]  1[]  2[]  3[]      10 Dysarthria 0[x]  1[]  2[]  UN[]      11 Extinct. and Inattention 0[x]  1[]  2[]       TOTAL: 6     ROS  Unable to ascertain due to hard of hearing.  Past History   Past Medical History:  Diagnosis Date   Diabetes mellitus without complication (HCC)    Kidney disease    Severe dementia (HCC) 12/05/2022    Past Surgical History:  Procedure Laterality Date   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   Procedure: TOTAL HIP ARTHROPLASTY ANTERIOR APPROACH;  Surgeon: Durene Romans, MD;  Location: [facility] OR;  Service: Orthopedics;  Laterality: Left;     Family History: History reviewed. No pertinent family history.  Social History  reports that he has never smoked. He has never used smokeless tobacco. He reports that he does not drink alcohol and does not use drugs.  Allergies  Allergen Reactions   Alogliptin Other (See Comments)    Unknown reaction    Penicillin G Rash    Medications   Current Facility-Administered Medications:     stroke: early stages of recovery book, , Does not apply, Once, Alfredo Martinez, MD   acetaminophen (TYLENOL) tablet 650 mg, 650 mg, Oral, Q6H, Dameron, Marisa, DO, 650 mg at 12/09/22 2158   aspirin EC tablet 325 mg, 325 mg, Oral, Daily, Hill, Avery S, PA-C, 325 mg at 12/09/22 1011   atorvastatin (LIPITOR) tablet 20 mg, 20 mg, Oral, QPM, Hill, Avery S, PA-C, 20 mg at 12/08/22 1723   cephALEXin (KEFLEX) capsule 500 mg, 500 mg, Oral, BID, Hill, Avery S, PA-C, 500 mg at 12/09/22 2158   ciprofloxacin (CIPRO) tablet 500 mg, 500 mg, Oral, Q12H, Hill, Avery S, PA-C, 500 mg at 12/09/22 1227   clopidogrel (PLAVIX) tablet 75 mg, 75 mg, Oral, Daily, Dameron, Nolberto Hanlon,  DO, 75 mg at 12/09/22 2157   Difluprednate 0.05 % EMUL 1 drop - PATIENT SUPPLIED, 1 drop, Ophthalmic, BID, Hill, Avery S, PA-C, 1 drop at 12/09/22 2208   docusate sodium (COLACE) capsule 100 mg, 100 mg, Oral, BID, Hill, Avery S, PA-C, 100 mg at 12/09/22 2208   enoxaparin (LOVENOX) injection 40 mg, 40 mg, Subcutaneous, Q24H, Dameron, Marisa, DO, 40 mg at 12/09/22 1009   finasteride (PROSCAR) tablet 5 mg, 5 mg, Oral, QPM, Hill, Avery S, PA-C, 5 mg at 12/08/22 1723   insulin aspart (novoLOG) injection 0-9 Units, 0-9 Units, Subcutaneous, Q4H, Maxwell, Allee, MD, 2 Units at 12/09/22 2347   menthol-cetylpyridinium (CEPACOL) lozenge 3 mg, 1 lozenge, Oral, PRN **OR** phenol (CHLORASEPTIC) mouth spray 1 spray, 1 spray, Mouth/Throat, PRN, Hill, Avery S, PA-C   multivitamin with minerals tablet 1 tablet, 1 tablet, Oral, Daily, Hill, Avery S, PA-C, 1 tablet at  12/09/22 1010   mupirocin ointment (BACTROBAN) 2 % 1 Application, 1 Application, Topical, BID, Hill, Avery S, PA-C, 1 Application at 12/09/22 2204   ondansetron (ZOFRAN) tablet 4 mg, 4 mg, Oral, Q6H PRN **OR** ondansetron (ZOFRAN) injection 4 mg, 4 mg, Intravenous, Q6H PRN, Hill, Avery S, PA-C   Oral care mouth rinse, 15 mL, Mouth Rinse, PRN, Pray, Milus Mallick, MD   oxyCODONE (Oxy IR/ROXICODONE) immediate release tablet 2.5-5 mg, 2.5-5 mg, Oral, Q4H PRN, Dameron, Marisa, DO   polyethylene glycol (MIRALAX / GLYCOLAX) packet 17 g, 17 g, Oral, Daily PRN, Hill, Avery S, PA-C   senna (SENOKOT) tablet 8.6 mg, 1 tablet, Oral, BID, Hill, Avery S, PA-C, 8.6 mg at 12/09/22 2157   sodium chloride (MURO 128) 5 % ophthalmic ointment - PATIENT SUPPLIED, , Right Eye, QHS, Hill, Avery S, PA-C, Given at 12/09/22 2208   sodium chloride tablet 1 g, 1 g, Oral, TID WC, Hill, Avery S, PA-C, 1 g at 12/09/22 1644   timolol (TIMOPTIC-XR) 0.5 % ophthalmic gel-forming 1 drop - PATIENT SUPPLIED, 1 drop, Both Eyes, Daily, Hill, Avery S, [place], 1 drop at 12/09/22 1019  Vitals   Vitals:   12/09/22 0434 12/09/22 1426 12/09/22 2143 12/09/22 2259  BP: (!) 162/84 127/60  (!) 124/97  Pulse: 68 68  62  Resp: 16 17    Temp:  98.3 F (36.8 C)  98 F (36.7 C)  TempSrc:  Oral  Oral  SpO2: 98% 98% 99% 99%  Weight:      Height:        Body mass index is 20.36 kg/m.  Physical Exam   Constitutional: Appears well-developed and well-nourished.  Psych: Affect appropriate to situation.  Eyes: No scleral injection.  HENT: No OP obstruction.  Head: Normocephalic.  Cardiovascular: Normal rate and regular rhythm.  Respiratory: Effort normal, non-labored breathing.  GI: Soft.  No distension. There is no tenderness.  Skin: WDI.   Neurologic Examination   Physical Exam   General: Laying comfortably in bed; in no acute distress.  HENT: Normal oropharynx and mucosa. Normal external appearance of ears and nose.  Neck: Supple, no  pain or tenderness  CV: No JVD. No peripheral edema.  Pulmonary: Symmetric Chest rise. Normal respiratory effort.  Abdomen: Soft to touch, non-tender.  Ext: No cyanosis, edema, or deformity  Skin: No rash. Normal palpation of skin.   Musculoskeletal: Normal digits and nails by inspection. No clubbing.   Neurologic Examination  Mental status/Cognition: Alert, oriented to self, place, month and year, good attention.  Speech/language: Fluent, comprehension intact, object naming intact, repetition  intact.  Cranial nerves:   CN II Pupils equal and reactive to light, no VF deficits    CN III,IV,VI EOM intact, no gaze preference or deviation, no nystagmus    CN V normal sensation in V1, V2, and V3 segments bilaterally    CN VII no asymmetry, no nasolabial fold flattening    CN VIII Hard of hearing BL   CN IX & X normal palatal elevation, no uvular deviation    CN XI 5/5 head turn and 5/5 shoulder shrug bilaterally    CN XII midline tongue protrusion    Motor:  Muscle bulk: normal, tone normal. Mvmt Root Nerve  Muscle Right Left Comments  SA C5/6 Ax Deltoid     EF C5/6 Mc Biceps 5 5   EE C6/7/8 Rad Triceps 5 5   WF C6/7 Med FCR     WE C7/8 PIN ECU     F Ab C8/T1 U ADM/FDI 5 5   HF L1/2/3 Fem Illopsoas  4   KE L2/3/4 Fem Quad     DF L4/5 D Peron Tib Ant 5 5   PF S1/2 Tibial Grc/Sol 5 5    Sensation:  Light touch Intact throughout   Pin prick    Temperature    Vibration   Proprioception    Coordination/Complex Motor:  - Finger to Nose intact BL - Heel to shin unalbe to do 2/2 R hip surgery - Rapid alternating movement are slowed - Gait: deferred for patient safety.   Labs   CBC:  Recent Labs  Lab 12/05/22 1835 12/06/22 0814 12/08/22 0333 12/09/22 0755  WBC 11.5*   < > 11.8* 11.3*  NEUTROABS 8.7*  --   --   --   HGB 11.5*   < > 10.4* 10.2*  HCT 34.2*   < > 30.1* 30.4*  MCV 90.5   < > 88.0 91.3  PLT 290   < > 287 317   < > = values in this interval not displayed.     Basic Metabolic Panel:  Lab Results  Component Value Date   NA 128 (L) 12/09/2022   K 4.3 12/09/2022   CO2 23 12/09/2022   GLUCOSE 189 (H) 12/09/2022   BUN 28 (H) 12/09/2022   CREATININE 0.98 12/09/2022   CALCIUM 7.8 (L) 12/09/2022   GFRNONAA >60 12/09/2022   Lipid Panel: No results found for: "LDLCALC" HgbA1c:  Lab Results  Component Value Date   HGBA1C 8.4 (H) 12/06/2022   Urine Drug Screen: No results found for: "LABOPIA", "COCAINSCRNUR", "LABBENZ", "AMPHETMU", "THCU", "LABBARB"  Alcohol Level No results found for: "ETH" INR No results found for: "INR" APTT No results found for: "APTT" AED levels: No results found for: "PHENYTOIN", "ZONISAMIDE", "LAMOTRIGINE", "LEVETIRACETA"   CT Head without contrast(Personally reviewed) 12/05/22: CTH was negative for a large hypodensity concerning for a large territory infarct or hyperdensity concerning for an ICH  CT angio Head and Neck with contrast: pending  MRI Brain(Personally reviewed): Small R Temporal stroke  Impression   Paul Colon is a 89 y.o. male admitted with R hip fx and confused and off from his baseline. MRI obtained as part of workup and demonstrates a small punctate R temporal stroke. Stroke is incidental and does not explain confusion/delirium.  Recommendations  - Frequent Neuro checks per stroke unit protocol - Recommend Vascular imaging with CTA head and neck - Recommend obtaining TTE - Recommend obtaining Lipid panel with LDL - Please start statin if LDL > 70 - Recommend  HbA1c to evaluate for diabetes and how well it is controlled. - Antithrombotic - Aspirin 81mg  daily along with plavix 75mg  daily x 21 days, followed by Aspirin 81mg  daily alone. - Recommend DVT ppx - SBP goal - aim for gadual normotension - Recommend Telemetry monitoring for arrythmia - Recommend bedside swallow screen prior to PO intake. - Stroke education booklet - Recommend PT/OT/SLP  consult  ______________________________________________________________________    Welton Flakes [facility]

## 2022-12-10 NOTE — Assessment & Plan Note (Addendum)
Family reports pt is not at baseline and pt has had short term memory loss x 6 mo. Hyponatremia improving. MRI shows acute stroke however neurology reports stroke is incidental and does not explain confusion/delirium.  -Discontinuing ciprofloxacin which may cause AMS -Delirium and fall precautions

## 2022-12-10 NOTE — Assessment & Plan Note (Addendum)
BGL consistently in 200s. A1c 8.4.  -sSSI q4h -home metformin held

## 2022-12-11 ENCOUNTER — Other Ambulatory Visit (HOSPITAL_COMMUNITY): Payer: Self-pay

## 2022-12-11 DIAGNOSIS — I639 Cerebral infarction, unspecified: Secondary | ICD-10-CM | POA: Diagnosis not present

## 2022-12-11 DIAGNOSIS — S00432A Contusion of left ear, initial encounter: Secondary | ICD-10-CM | POA: Diagnosis not present

## 2022-12-11 LAB — BASIC METABOLIC PANEL
Anion gap: 10 (ref 5–15)
BUN: 21 mg/dL (ref 8–23)
CO2: 25 mmol/L (ref 22–32)
Calcium: 8.5 mg/dL — ABNORMAL LOW (ref 8.9–10.3)
Chloride: 96 mmol/L — ABNORMAL LOW (ref 98–111)
Creatinine, Ser: 0.93 mg/dL (ref 0.61–1.24)
GFR, Estimated: >60 mL/min (ref >60)
Glucose, Bld: 100 mg/dL — ABNORMAL HIGH (ref 70–99)
Potassium: 4.1 mmol/L (ref 3.5–5.1)
Sodium: 131 mmol/L — ABNORMAL LOW (ref 135–145)

## 2022-12-11 LAB — CBC
HCT: 30.2 % — ABNORMAL LOW (ref 39.0–52.0)
Hemoglobin: 10.1 g/dL — ABNORMAL LOW (ref 13.0–17.0)
MCH: 30.2 pg (ref 26.0–34.0)
MCHC: 33.4 g/dL (ref 30.0–36.0)
MCV: 90.4 fL (ref 80.0–100.0)
Platelets: 344 10*3/uL (ref 150–400)
RBC: 3.34 MIL/uL — ABNORMAL LOW (ref 4.22–5.81)
RDW: 12.6 % (ref 11.5–15.5)
WBC: 7.8 10*3/uL (ref 4.0–10.5)
nRBC: 0 % (ref 0.0–0.2)

## 2022-12-11 LAB — GLUCOSE, CAPILLARY
Glucose-Capillary: 107 mg/dL — ABNORMAL HIGH (ref 70–99)
Glucose-Capillary: 110 mg/dL — ABNORMAL HIGH (ref 70–99)
Glucose-Capillary: 206 mg/dL — ABNORMAL HIGH (ref 70–99)
Glucose-Capillary: 211 mg/dL — ABNORMAL HIGH (ref 70–99)

## 2022-12-11 MED ORDER — ACETAMINOPHEN 325 MG PO TABS: 650.0000 mg | ORAL_TABLET | Freq: Four times a day (QID) | ORAL | Status: DC

## 2022-12-11 MED ORDER — CLOPIDOGREL BISULFATE 75 MG PO TABS
75.0000 mg | ORAL_TABLET | Freq: Every day | ORAL | 0 refills | Status: DC
  Filled 2022-12-11: qty 27, 27d supply, fill #0

## 2022-12-11 MED ORDER — SENNA 8.6 MG PO TABS: 1.0000 | ORAL_TABLET | Freq: Two times a day (BID) | ORAL | Status: DC

## 2022-12-11 MED ORDER — ASPIRIN 81 MG PO TBEC
DELAYED_RELEASE_TABLET | ORAL | 12 refills | Status: DC
  Filled 2022-12-11: qty 30, 15d supply, fill #0

## 2022-12-11 MED ORDER — CEPHALEXIN 500 MG PO CAPS
500.0000 mg | ORAL_CAPSULE | Freq: Two times a day (BID) | ORAL | 0 refills | Status: AC
  Filled 2022-12-11: qty 3, 2d supply, fill #0

## 2022-12-11 MED ORDER — POLYETHYLENE GLYCOL 3350 17 G PO PACK: 17.0000 g | PACK | Freq: Every day | ORAL | Status: DC | PRN

## 2022-12-11 NOTE — Assessment & Plan Note (Deleted)
Family reports pt is not at baseline and pt has had short term memory loss x 6 mo. Hyponatremia improving. MRI shows acute stroke however neurology reports stroke is incidental and does not explain confusion/delirium. Likely multifactorial including AKI, hyponatremia, delirium from dementia and cipro side effects. -Discontinuing ciprofloxacin which may cause AMS -Delirium and fall precautions -neuro consult: see above

## 2022-12-11 NOTE — Assessment & Plan Note (Deleted)
Prior CT head negative and unremarkable neuro exam suggesting stroke. However, at family request for MRI brain for further evaluation of weakness/AMS which showed right temporal lobe small acute infarct. TSH 2.526. Lipid panel normal. CTA head and neck shows 70% narrowing of the proximal right subclavian artery, no intracranial large vessel occlusion or proximal high-grade arterial stenosis. EEG shows mild diffuse encephalopathy with no seizures. TTE shows EF 55-60% with LVH, mild MVR and AVR. CXR showed minimal right basilar subsegmental atelectasis or scarring. - neuro consult: neuro checks, SLP, telemetry for arrhythmia, swallow screen - Aspirin 81mg  daily along with plavix 75mg  daily x 21 days, followed by Aspirin 81mg  daily alone. Added on plavix.  - aim for gradual normotension. BP around 160/65.

## 2022-12-11 NOTE — Plan of Care (Signed)
Met with patient's daughter Dia Crawford) at request of primary medical team. She has been concerned about the findings of the recent MRIs and scans, requesting someone from the neurology team.  Spent approximately 30 minutes explaining the different types of scans and the results. She was appreciative.   Signed: Christie Nottingham, MD [facility] Phys[facility], PGY-1 12/11/2022 7:47 PM

## 2022-12-11 NOTE — Assessment & Plan Note (Deleted)
S/p drainage on 10/24.  Stable -Continue Keflex 500 mg BID until last dose on 10/30 per ENT -Stopped Cipro iso AMS and suspected involvement of abx -mupirocin BID until bolster removal 11/01 by ENT, if pt is discharged before then notify ENT for outpatient appt -monitor bleeding, now on DAPT

## 2022-12-11 NOTE — TOC Transition Note (Signed)
Transition of Care [facility]) - CM/SW Discharge Note   Patient Details  Name: Paul Colon MRN: [id number] Date of Birth: 12/09/1929  Transition of Care [facility] CM/SW Contact:  Epifanio Lesches, RN Phone Number: 12/11/2022, 3:16 PM   Clinical Narrative:    Patient will DC to: home  Anticipated DC date: 12/11/2022 Family notified: yes Transport by: car       - s/p Right hip hemiarthroplasty  Per MD patient ready for DC today . RN, patient, patient's daughter notified of DC. Daughter to assist with care once home. Orders noted for home health services . Daughter agreeable to home health services. Preference: [facility].  Referral made with [facility] and accepted. Daughter states pt without RX med conderns. Daughter to provide transportation to home.  RNCM will sign off for now as intervention is no longer needed. Please consult [place] again if new needs arise.    Final next level of care: Home w Home Health Services Barriers to Discharge: No Barriers Identified   Patient Goals and CMS Choice   Choice offered to / list presented to : Adult Children  Discharge Placement                         Discharge Plan and Services Additional resources added to the After Visit Summary for                            [facility] Arranged: PT, OT, Speech Therapy HH Agency: [facility] Date [facility] Agency Contacted: 12/11/22 Time HH Agency Contacted: [phone] Representative spoke with at [facility] Agency: Tresa Endo  Social Determinants of Health (SDOH) Interventions SDOH Screenings   Food Insecurity: No Food Insecurity (12/05/2022)  Housing: Low Risk  (12/05/2022)  Transportation Needs: No Transportation Needs (12/05/2022)  Utilities: Not At Risk (12/05/2022)  Social Connections: Unknown (09/26/2021)   Received from [facility]  Tobacco Use: Low Risk  (12/07/2022)     Readmission Risk Interventions     No data to display

## 2022-12-11 NOTE — Assessment & Plan Note (Deleted)
S/p right hemiarthroplasty on 10/24.  Has not required any as needed pain meds. Plan is home health PT/OT.  -Pain management: sch Tylenol, oxy as needed -PT/OT following

## 2022-12-11 NOTE — Progress Notes (Signed)
Physical Therapy Treatment Patient Details Name: Paul Colon MRN: [id number] DOB: 1930/01/03 Today's Date: 12/11/2022   History of Present Illness The pt is a 89 yo male presenting 10/23 after a fall onto R hip. Pt found to have acute displaced femoral neck fx He is now s/p R nailing on 12/07/22, workup for confusion,  MRI brain demonstrated a new small punctate R temporal lobe stroke .PMH includes: dementia, frequent falls, L femoral neck fx 01/2022, DM II, HLD, CKD, BPH, and anemia.    PT Comments  Continuing work on functional mobility and activity tolerance; patient's daughter Paul Colon was present for the session, which focused on progressive ambulation and stair training, in prep for DC home; patient was able to walk greater distance than previous sessions, and he performed going up and down three steps with two rails overall well; discussed safety concerns with his daughter, and recommend someone stay very close behind him when he is walking or going up and downstairs;  Paul Colon seemed to surprised that her father hasn't been walking the same distances that he did his last admission to the hospital; we briefly talked about the presence of a stroke, complicating his situation somewhat; I'm glad she was able to observe her father and his current functional status;she chooses to bring him home from the hospital and not go to post acute rehab; this PT agrees, [name] will more likely thrive in his own home environment with familiar routine and caregivers; discussed the session and recommendations for DC with Dr. Melissa Noon, and appreciate her conversation and note    If plan is discharge home, recommend the following: Assistance with cooking/housework;Help with stairs or ramp for entrance;A lot of help with walking and/or transfers;A lot of help with bathing/dressing/bathroom;Direct supervision/assist for medications management;Direct supervision/assist for financial management   Can travel by private  vehicle        Equipment Recommendations  None recommended by PT    Recommendations for Other Services       Precautions / Restrictions Precautions Precautions: Fall Precaution Comments: HOH with R hearing aid Restrictions RLE Weight Bearing: Weight bearing as tolerated     Mobility  Bed Mobility                    Transfers Overall transfer level: Needs assistance Equipment used: Rolling walker (2 wheels) Transfers: Sit to/from Stand Sit to Stand: Min assist           General transfer comment: cues for hand placement and able to stand from edge of chair with min assist. cues for walker use and assistance with guiding walker    Ambulation/Gait Ambulation/Gait assistance: Min assist Gait Distance (Feet): 40 Feet (+10) Assistive device: Rolling walker (2 wheels) Gait Pattern/deviations: Step-through pattern, Decreased stride length, Shuffle, Trunk flexed       General Gait Details: Tending to keep RW too far ahead of his center of mass; cues for posture; Near constant min assist for control of RW   Stairs Stairs: Yes Stairs assistance: Contact guard assist, Min assist Stair Management: Two rails, Alternating pattern, Forwards Number of Stairs: 3 General stair comments: Trunk flexed, but overall performed well; showed pt's daughter how to adjust legs of a shower chair to place in the middle of steps and give pt a possible place to sit if he fatigues while going up steps   Wheelchair Mobility     Tilt Bed    Modified Rankin (Stroke Patients Only)       Balance  Sitting balance-Leahy Scale: Fair       Standing balance-Leahy Scale: Poor                              Cognition Arousal: Alert Behavior During Therapy: WFL for tasks assessed/performed Overall Cognitive Status: History of cognitive impairments - at baseline                                 General Comments: difficulty following commands due to [facility],  able to follow one step commands        Exercises      General Comments General comments (skin integrity, edema, etc.): Pt's daughter present for session; she states she is overall confident in family's ability to care for pt at current functional level, and will use the wheelchair more if needed      Pertinent Vitals/Pain Pain Assessment Pain Assessment: Faces Pain Score: 0-No pain Pain Intervention(s): Monitored during session    Home Living                          Prior Function            PT Goals (current goals can now be found in the care plan section) Acute Rehab PT Goals Patient Stated Goal: Per daughter, return home with family assist PT Goal Formulation: With patient/family Time For Goal Achievement: 12/22/22 Potential to Achieve Goals: Good Progress towards PT goals: Progressing toward goals    Frequency    Min 1X/week      PT Plan      Co-evaluation              AM-PAC PT "6 Clicks" Mobility   Outcome Measure  Help needed turning from your back to your side while in a flat bed without using bedrails?: A Lot Help needed moving from lying on your back to sitting on the side of a flat bed without using bedrails?: A Lot Help needed moving to and from a bed to a chair (including a wheelchair)?: A Little Help needed standing up from a chair using your arms (e.g., wheelchair or bedside chair)?: A Little Help needed to walk in hospital room?: Total Help needed climbing 3-5 steps with a railing? : Total 6 Click Score: 12    End of Session Equipment Utilized During Treatment: Gait belt Activity Tolerance: Patient tolerated treatment well Patient left: in chair;with call bell/phone within reach;with family/visitor present Nurse Communication: Mobility status;Other (comment) (and outside of ear dressing fell off) PT Visit Diagnosis: Unsteadiness on feet (R26.81);Other abnormalities of gait and mobility (R26.89);Muscle weakness (generalized)  (M62.81);Difficulty in walking, not elsewhere classified (R26.2);Pain Pain - Right/Left: Left Pain - part of body: Hip     Time: 7824-2353 PT Time Calculation (min) (ACUTE ONLY): 40 min  Charges:    $Gait Training: 23-37 mins $Therapeutic Activity: 8-22 mins PT General Charges $$ ACUTE PT VISIT: 1 Visit                     Van Clines, PT  Acute Rehabilitation Services Office [phone] Secure Chat welcomed    Levi Aland 12/11/2022, 2:26 PM

## 2022-12-11 NOTE — Assessment & Plan Note (Deleted)
Repeat CXR showed minimal right basilar subsegmental atelectasis or scarring. Low concern for pna or infection.  - Incentive spirometry and pulmonary toilet postoperatively.

## 2022-12-11 NOTE — Assessment & Plan Note (Deleted)
A1c 8.4. BGL improved. -sSSI q4h -home metformin held

## 2022-12-11 NOTE — Progress Notes (Signed)
Discharge instructions given. Patient verbalized understanding and all questions were answered.  ?

## 2022-12-11 NOTE — Discharge Summary (Addendum)
FMTS Attending Daily Note: Burley Saver, MD  Team Pager [phone] Pager [phone]  I have reviewed the below documentation, discussed the patient with resident and medical student, and reviewed the assessment and plan as documented in the medical students note. I have personally seen the patient on the day of discharge and agree with the below exam.  Time spent planning discharge: 27 minutes In addition to below, I recommend the following at follow up:  Daughter at bedside, comfortable with taking home, noted Walker needed for home, ordered. Discussed with daughter at bedside plan for anticoagulation for both recent CVA and DVT ppx, discussed with ortho and neurology. Discussed risk of bleeding. Ear hematoma- without signs of recurrence, improving erythema and no drainage. ENT messaged to schedule OP follow up for bolster removal.  Discussed to follow up with PCP for ophthomalogy as she called office for missed appt and reschedule to a later date.  Na stable at discharge and would continue fluid restriction and 1 g TID NaCL tabs. Urine studies pending at discharge but previous extensive work up consistent with SIADH and responded to treatment for that in hospital. Recommend continued goals of care discussions outpatient.     Family Medicine Teaching [facility] Discharge Summary  Patient nam e: Cardell Tae Medical record number: [id number] Date of birth: 1929/08/15 Age: 89 y.o. Gender: male Date of Admission: 12/05/2022  Date of Discharge: 12/11/22 Admitting Physician: Alfredo Martinez, MD  Primary Care Provider: [facility] Consultants: Orthopedic surgery, ENT, neuro   Indication for Hospitalization: Right hip fracture  Brief Hospital Course:  Elijaah Osmun is a 89 y.o.male with a history of hyponatremia, HTN, DM2, CKD3a, anemia who was admitted to the [facility] at [facility] for fall resulting in hip fracture. His hospital course is detailed below:  Fall  Started  after patient fell off balance trying to get into the car. Fell onto the right side and was partially witnessed by daughter. Hip XR in ED showed right femoral neck fracture. CT head unremarkable for acute bleed. ECHO showed normal EF 55-60% with no regional wall motion abnormalities. PT/OT/SLP consulted inpatient and home health ordered. Walker for DME ordered as well.  Right Hip Fracture  Noted on imaging and required fixation with orthopedics. Tolerated well and PT/OT followed. Pt received Tylenol 650 mg Q6 and did not take any oxycodone as needed. Ordered rolling walker. Discussed with ortho and neuro for aspirin 81 mg BID x 30 days total for DVT ppx.   Cognitive Decline  CT head with chronic changes, chronic ischemic disease. B12 and folate normal. Palliative care declined by daughter. MRI showed acute infarct however low suspicion for cause of confusion per neurology. LDL appropriate, ECHO as above without abnormality. Aspirin 81 mg BID + plavix for 30 days per neurology, then resume plavix daily.   Hyponatremia  Resumed pt's regimen of salt tablets and 1 L fluid restriction with improvement in hyponatremia. Curbsided nephrology and urine studies repeated to ensure similar presentation, pending at discharge but did respond to previous treatments.   Stroke Found on MR MRI brain showed small acute right temporal lobe infarct. Stroke work up initiated and started DAPT therapy with DVT prophylaxis: Aspirin 81 mg BID with Plavix 75 daily for 30 days, then ASA 81 mg daily alone. Pt on Lipitor 20 mg and LDL at goal so no changes made.   Left ear hematoma Required drainage again by ENT. Continued Keflex (500 mg BID until last dose on 10/30) but stopped ciprofloxacin iso  possible risk factor for confusion/AMS and patient had received 5 days of ciprofloxacin. Continue mupirocen BID until bolster removal which ENT was messaged to schedule OP follow up.   Other chronic conditions were medically managed  with home medications and formulary alternatives as necessary.   PCP Follow-up Recommendations: ENT bolster removal 11/1, monitor for left ear bleeding while being on DAPT and DVT prophylaxis Consider outpatient Ophthalmologist referral by PCP for missed appt and further follow up  Follow up with neurology: [facility]a [facility] in about 4 weeks , Aspirin 81 mg BID + plavix for 30 days, then aspirin daily Follow up with orthopedics in 2 weeks for post op check Follow up hyponatremia workup: urine osm, urine Na, serum osm pending at discharge   Discharge Diagnoses/Problem List:  Right hip fracture s/p hemiarthroplasty Hyponatremia Acute cerebral infarct Left ear hematoma T2DM Cognitive decline CKD stage IIIa with anemia HLD BPH S/p cataract surgery  Disposition: Home health with PT/OT/SLP  Discharge Condition: Improved  Discharge Exam:  General: Elderly appearing male sitting up in bed.  No acute distress.  HEENT: Bandage covering left ear, no significant swelling. Resolved erythema, no drainage, no recurrence of hematoma. Hard of hearing.  Cardiovascular: RRR without murmur Respiratory: CTAB.  Normal work of breathing on room air  Extremities: bandage c/d/I over right hip, no edema or swelling  Significant Procedures: right hemiarthroplasty on 10/24   Significant Labs and Imaging:  Recent Labs  Lab 12/11/22 0827  WBC 7.8  HGB 10.1*  HCT 30.2*  PLT 344   Recent Labs  Lab 12/09/22 2252 12/10/22 0825 12/11/22 0644  NA 128* 132* 131*  K 4.3 3.8 4.1  CL 96* 93* 96*  CO2 23 27 25   GLUCOSE 189* 155* 100*  BUN 28* 18 21  CREATININE 0.98 0.90 0.93  CALCIUM 7.8* 8.6* 8.5*   MRI brain IMPRESSION: 1. Punctate focus of restricted diffusion in the right temporal lobe medially subjacent to the lateral ventricle compatible with a small acute infarct. 2. Moderate generalized atrophy and white matter disease likely reflects the sequela of chronic microvascular ischemia. 3.  Remote lacunar infarct of the right lentiform nucleus. 4. Right mastoid effusion. No obstructing nasopharyngeal lesion is Present  IMPRESSION: CT head: 1. A known small acute right temporal lobe infarct is occult by CT and was better appreciated on yesterday's brain MRI. 2. Background parenchymal atrophy and chronic small vessel ischemic disease, as described.   CTA neck: 1. The common carotid and internal carotid arteries are patent within the neck without stenosis. Mild atherosclerotic plaque bilaterally, as described. 2. The vertebral arteries are patent within the neck without stenosis or significant atherosclerotic disease. 3. 70% narrowing of the proximal right subclavian artery, which appears secondary to a combination of vessel tortuosity and atherosclerotic plaque. 4. Aortic Atherosclerosis (ICD10-I70.0). 5. Please note the heart is excluded from the field of view on this examination, precluding evaluation for an intracardiac shunt. Dedicated cardiac imaging may be obtained for further evaluation, as warranted.   CTA head: 1. No intracranial large vessel occlusion or proximal high-grade arterial stenosis. 2. Non-stenotic atherosclerotic plaque within the intracranial ICAs.   CXR IMPRESSION: Minimal right basilar subsegmental atelectasis or scarring.   EEG IMPRESSION: This study is suggestive of mild diffuse encephalopathy. No seizures or epileptiform discharges were seen throughout the recording.   Echo IMPRESSIONS   1. Left ventricular ejection fraction, by estimation, is 55 to 60%. The  left ventricle has normal function. The left ventricle has no regional  wall motion abnormalities. There is moderate asymmetric left ventricular  hypertrophy of the basal-septal  segment. Left ventricular diastolic parameters are indeterminate.   2. Right ventricular systolic function is mildly reduced. The right  ventricular size is normal.   3. The mitral valve is normal in  structure. Mild mitral valve  regurgitation.   4. The aortic valve is calcified. Aortic valve regurgitation is mild.  Aortic valve sclerosis/calcification is present, without any evidence of  aortic stenosis.   Pelvic XR IMPRESSION: 1. Right hip hemiarthroplasty in place without early complicating feature identified.  Results/Tests Pending at Time of Discharge:  Urine Osm studies   Discharge Medications:  Allergies as of 12/11/2022       Reactions   Alogliptin Other (See Comments)   Unknown reaction    Penicillin G Rash        Medication List     STOP taking these medications    ciprofloxacin 500 MG tablet Commonly known as: CIPRO   magnesium oxide 400 (240 Mg) MG tablet Commonly known as: MAG-OX       TAKE these medications    acetaminophen 325 MG tablet Commonly known as: TYLENOL Take 2 tablets (650 mg total) by mouth every 6 (six) hours.   aspirin EC 81 MG tablet Take 1 tablet by mouth twice daily for 27 days. Then, take 1 tablet by mouth daily indefinitely. What changed:  how much to take how to take this when to take this additional instructions   atorvastatin 20 MG tablet Commonly known as: LIPITOR Take 1 tablet (20 mg total) by mouth every evening. What changed: Another medication with the same name was removed. Continue taking this medication, and follow the directions you see here.   cephALEXin 500 MG capsule Commonly known as: KEFLEX Take 1 capsule (500 mg total) by mouth 2 (two) times daily for 3 doses.   clopidogrel 75 MG tablet Commonly known as: PLAVIX Take 1 tablet (75 mg total) by mouth daily for 27 days. Start taking on: December 12, 2022   Difluprednate 0.05 % Emul Place 1 drop into the right eye 2 (two) times daily. Night time 1st   finasteride 5 MG tablet Commonly known as: PROSCAR Take 5 mg by mouth every evening. For enlarged prostate   metFORMIN 1000 MG tablet Commonly known as: GLUCOPHAGE Take 1,000 mg by mouth 2 (two)  times daily with a meal. What changed: Another medication with the same name was removed. Continue taking this medication, and follow the directions you see here.   mupirocin ointment 2 % Commonly known as: BACTROBAN Apply 1 Application topically 2 (two) times daily. Over the yellow gauze twice per day   ONE-A-DAY MENS 50+ PO Take 1 tablet by mouth in the morning.   polyethylene glycol 17 g packet Commonly known as: MIRALAX / GLYCOLAX Take 17 g by mouth daily as needed for mild constipation.   senna 8.6 MG Tabs tablet Commonly known as: SENOKOT Take 1 tablet (8.6 mg total) by mouth 2 (two) times daily.   sodium chloride 1 g tablet Take 1 tablet (1 g total) by mouth 3 (three) times daily with meals.   sodium chloride 5 % ophthalmic solution Commonly known as: MURO 128 Place 1 drop into the right eye at bedtime.   timolol 0.5 % ophthalmic solution Commonly known as: TIMOPTIC Place 1 drop into both eyes daily.               Durable Medical Equipment  (From admission,  onward)           Start     Ordered   12/11/22 1247  For home use only DME Walker rolling  Once       Question Answer Comment  Walker: With 5 Inch Wheels   Patient needs a walker to treat with the following condition Weakness      12/11/22 1246            Discharge Instructions: Please refer to Patient Instructions section of EMR for full details.  Patient was counseled important signs and symptoms that should prompt return to medical care, changes in medications, dietary instructions, activity restrictions, and follow up appointments.   Follow-Up Appointments:  Follow-up Information     Clois Dupes, [place]. Schedule an appointment as soon as possible for a visit in 2 week(s).   Specialty: Orthopedic Surgery Why: For suture removal, For wound re-check Contact information: [address] [place] [address] [phone]         Drema Dallas, DO. Schedule an appointment  as soon as possible for a visit in 1 month(s).   Specialty: Neurology Contact information: [address] [place] [address] [phone]         Clinic, [facility] Schedule an appointment as soon as possible for a visit.   Why: Make an appointment for hospital follow up ASAP. Contact information: [address] [facility] [place] [address] [phone]         [facility], [facility] Follow up.   Specialty: [facility] Contact information: [address] [place] [address] [phone]                 Billey Co, MD 12/11/2022, 6:02 PM [facility]

## 2022-12-11 NOTE — Progress Notes (Signed)
Occupational Therapy Treatment Patient Details Name: Paul Colon MRN: [id number] DOB: Nov 02, 1929 Today's Date: 12/11/2022   History of present illness The pt is a 89 yo male presenting 10/23 after a fall onto R hip. Pt found to have acute displaced femoral neck fx He is now s/p R nailing on 12/07/22, workup for confusion,  MRI brain demonstrated a new small punctate R temporal lobe stroke .PMH includes: dementia, frequent falls, L femoral neck fx 01/2022, DM II, HLD, CKD, BPH, and anemia.   OT comments  Patient demonstrating good gains with OT treatment. Patient requiring mod assist of one to get to EOB due to assistance needed with  BLEs and scooting forward. Patient able to maintain balance while seated on EOB while performing gown change. Patient demonstrating gains with transfers with min assist to stand and mod assist for step transfer to recliner. Patient required setup for self feeding and was able to feed self with weighted fork and spoon with min to no spillage. Daughter present during visit. Discharge recommendation continues to be appropriate. Acute OT to continue to follow for self feeding, grooming, and functional transfers.       If plan is discharge home, recommend the following:  A lot of help with bathing/dressing/bathroom;Assistance with feeding;Assistance with cooking/housework;Supervision due to cognitive status;Help with stairs or ramp for entrance;A lot of help with walking and/or transfers   Equipment Recommendations  None recommended by OT    Recommendations for Other Services      Precautions / Restrictions Precautions Precautions: Fall Precaution Comments: HOH with R hearing aid Restrictions Weight Bearing Restrictions: Yes RLE Weight Bearing: Weight bearing as tolerated       Mobility Bed Mobility Overal bed mobility: Needs Assistance Bed Mobility: Supine to Sit     Supine to sit: Mod assist, HOB elevated, Used rails     General bed mobility  comments: cues for bed rail use and assistance with BLEs, bed pad used to assist with scooting to EOB    Transfers Overall transfer level: Needs assistance Equipment used: Rolling walker (2 wheels) Transfers: Sit to/from Stand, Bed to chair/wheelchair/BSC Sit to Stand: Min assist     Step pivot transfers: Mod assist     General transfer comment: cues for hand placement and able to stand from EOB with min assist. cues for walker use and assistance with guiding walker     Balance Overall balance assessment: Needs assistance Sitting-balance support: No upper extremity supported, Feet supported Sitting balance-Leahy Scale: Fair Sitting balance - Comments: supervision seated on EOB with on UE support   Standing balance support: Reliant on assistive device for balance, Bilateral upper extremity supported, During functional activity Standing balance-Leahy Scale: Poor Standing balance comment: reliant on UE support with RW                           ADL either performed or assessed with clinical judgement   ADL Overall ADL's : Needs assistance/impaired Eating/Feeding: Set up;Supervision/ safety;With adaptive utensils;Sitting Eating/Feeding Details (indicate cue type and reason): used weighted utensils with patient demonstrating decreased tremors Grooming: Wash/dry hands;Wash/dry face;Set up;Sitting           Upper Body Dressing : Minimal assistance;Sitting Upper Body Dressing Details (indicate cue type and reason): doff/don gown                        Extremity/Trunk Assessment  Vision       Perception     Praxis      Cognition Arousal: Alert Behavior During Therapy: WFL for tasks assessed/performed Overall Cognitive Status: History of cognitive impairments - at baseline                                 General Comments: difficulty following commands due to [facility], able to follow one step commands        Exercises       Shoulder Instructions       General Comments      Pertinent Vitals/ Pain       Pain Assessment Pain Assessment: Faces Faces Pain Scale: No hurt Pain Intervention(s): Monitored during session  Home Living                                          Prior Functioning/Environment              Frequency  Min 1X/week        Progress Toward Goals  OT Goals(current goals can now be found in the care plan section)  Progress towards OT goals: Progressing toward goals  Acute Rehab OT Goals Patient Stated Goal: go home OT Goal Formulation: With family Time For Goal Achievement: 12/22/22 Potential to Achieve Goals: Good ADL Goals Pt Will Perform Eating: with adaptive utensils;sitting;with min assist Pt Will Perform Lower Body Bathing: with caregiver independent in assisting;sitting/lateral leans;with min assist Pt Will Perform Lower Body Dressing: with caregiver independent in assisting;sit to/from stand;with mod assist Pt Will Transfer to Toilet: bedside commode;stand pivot transfer;with min assist  Plan      Co-evaluation                 AM-PAC OT "6 Clicks" Daily Activity     Outcome Measure   Help from another person eating meals?: A Little Help from another person taking care of personal grooming?: A Little Help from another person toileting, which includes using toliet, bedpan, or urinal?: A Lot Help from another person bathing (including washing, rinsing, drying)?: A Lot Help from another person to put on and taking off regular upper body clothing?: A Lot Help from another person to put on and taking off regular lower body clothing?: A Lot 6 Click Score: 14    End of Session Equipment Utilized During Treatment: Gait belt;Rolling walker (2 wheels)  OT Visit Diagnosis: Unsteadiness on feet (R26.81);Other abnormalities of gait and mobility (R26.89);History of falling (Z91.81);Muscle weakness (generalized) (M62.81)   Activity  Tolerance Patient tolerated treatment well   Patient Left in chair;with call bell/phone within reach;with chair alarm set;with family/visitor present   Nurse Communication Mobility status        Time: 5366-4403 OT Time Calculation (min): 29 min  Charges: OT General Charges $OT Visit: 1 Visit OT Treatments $Self Care/Home Management : 23-37 mins  Alfonse Flavors, OTA Acute Rehabilitation Services  Office [phone]   Dewain Penning 12/11/2022, 10:58 AM

## 2022-12-11 NOTE — Care Plan (Signed)
Spoke with [facility] with Physical Therapy.  Value familiar environment and caretakers, can consider post-acute rehab although do not think he would thrive at a SNF.  ?Extra support than a home health aide, we will see how he does at home  Ordered home DME rolling walker- needed for safety prior to discharge. Home health PT, OT, SLP ordered. We did also ask neurology, Dr. Roda Shutters to touch base with daughter prior to discharge, per her request  Greatly appreciate Holly's assistance in working with patient/discharge planning for safety. Discharge order placed pending above tasks.  Darral Dash, DO

## 2022-12-11 NOTE — Assessment & Plan Note (Deleted)
Chronic, baseline NA around 130. Previous studies suggested SIADH. Has improved. Today 131.  -urine osmole studies pending -Continue salt tablet and fluid restriction per Nephro -Daily BMP

## 2022-12-12 ENCOUNTER — Telehealth: Payer: Self-pay

## 2022-12-12 NOTE — Telephone Encounter (Signed)
Transition Care Management Follow-up Telephone Call     Date discharged? 12/05/22   How have you been since you were released from the hospital? His hip has been swollen they called the surgeon    Any patient concerns? pt was out of it this morning, but came around, it happened a year ago was told to increase his water, pt daughter was told the sign and symptoms of stroke, and advised that if he had any more episodes to call 911 to have him evaluated.       Items Reviewed: Medications reviewed:  Medication List       STOP taking these medications     ciprofloxacin 500 MG tablet Commonly known as: CIPRO    magnesium oxide 400 (240 Mg) MG tablet Commonly known as: MAG-OX           TAKE these medications     acetaminophen 325 MG tablet Commonly known as: TYLENOL Take 2 tablets (650 mg total) by mouth every 6 (six) hours.    aspirin EC 81 MG tablet Take 1 tablet by mouth twice daily for 27 days. Then, take 1 tablet by mouth daily indefinitely. What changed:  how much to take how to take this when to take this additional instructions    atorvastatin 20 MG tablet Commonly known as: LIPITOR Take 1 tablet (20 mg total) by mouth every evening. What changed: Another medication with the same name was removed. Continue taking this medication, and follow the directions you see here.    cephALEXin 500 MG capsule Commonly known as: KEFLEX Take 1 capsule (500 mg total) by mouth 2 (two) times daily for 3 doses.    clopidogrel 75 MG tablet Commonly known as: PLAVIX Take 1 tablet (75 mg total) by mouth daily for 27 days. Start taking on: December 12, 2022    Difluprednate 0.05 % Emul Place 1 drop into the right eye 2 (two) times daily. Night time 1st    finasteride 5 MG tablet Commonly known as: PROSCAR Take 5 mg by mouth every evening. For enlarged prostate    metFORMIN 1000 MG tablet Commonly known as: GLUCOPHAGE Take 1,000 mg by mouth 2 (two) times daily with a  meal. What changed: Another medication with the same name was removed. Continue taking this medication, and follow the directions you see here.    mupirocin ointment 2 % Commonly known as: BACTROBAN Apply 1 Application topically 2 (two) times daily. Over the yellow gauze twice per day    ONE-A-DAY MENS 50+ PO Take 1 tablet by mouth in the morning.    polyethylene glycol 17 g packet Commonly known as: MIRALAX / GLYCOLAX Take 17 g by mouth daily as needed for mild constipation.    senna 8.6 MG Tabs tablet Commonly known as: SENOKOT Take 1 tablet (8.6 mg total) by mouth 2 (two) times daily.    sodium chloride 1 g tablet Take 1 tablet (1 g total) by mouth 3 (three) times daily with meals.    sodium chloride 5 % ophthalmic solution Commonly known as: MURO 128 Place 1 drop into the right eye at bedtime.    timolol 0.5 % ophthalmic solution Commonly known as: TIMOPTIC Place 1 drop into both eyes daily.   Allergies reviewed:  Alogliptin Other (See Comments)     Unknown reaction     Penicillin G Rash   Dietary changes reviewed: carb mod diet  Referrals reviewed:   Follow-up Information       Clint Bolder  S, PA-C. Schedule an appointment as soon as possible for a visit in 2 week(s).   Specialty: Orthopedic Surgery Why: For suture removal, For wound re-check Contact information: [address] [place] [address] [phone]              Drema Dallas, DO. Schedule an appointment as soon as possible for a visit in 1 month(s).   Specialty: Neurology Contact information: [address]  [address] [place] [address] [phone]              [facility] Schedule an appointment as soon as possible for a visit.   Why: Make an appointment for hospital follow up ASAP. Contact information: [address] [facility] [place] [address] [phone]              [facility] Follow up.   Specialty: Home  Health Services Contact information: [address] [place] [address] [phone]         Functional Questionnaire:  Independent - I Dependent - D    Activities of Daily Living (ADLs):     Personal hygiene - I =  with help at times  Dressing - I Eating - I Maintaining continence - I =  with help at times Transferring - D needs help   Independent Activities of Daily Living (iADLs): Basic communication skills - I  has trouble with hearing has hearing aid in right ear Transportation - D Meal preparation  - D Shopping - D Housework - D Managing medications - D Daughter does it for him,  Managing personal finances - D does it for him but she thinks he can do it if he has to do it,    Confirmed importance and date/time of follow-up visits scheduled 12/19/2022  12:50 Provider Appointment booked with Dr Everlena Cooper   Confirmed with patient if condition begins to worsen call PCP or go to the ER.  Patient was given the office number and encouraged to call back with question or concerns: Yes

## 2022-12-17 ENCOUNTER — Ambulatory Visit (INDEPENDENT_AMBULATORY_CARE_PROVIDER_SITE_OTHER): Payer: No Typology Code available for payment source | Admitting: Otolaryngology

## 2022-12-17 ENCOUNTER — Encounter (INDEPENDENT_AMBULATORY_CARE_PROVIDER_SITE_OTHER): Payer: Self-pay

## 2022-12-17 ENCOUNTER — Telehealth (INDEPENDENT_AMBULATORY_CARE_PROVIDER_SITE_OTHER): Payer: Self-pay | Admitting: Otolaryngology

## 2022-12-17 VITALS — Ht 71.0 in | Wt 146.0 lb

## 2022-12-17 DIAGNOSIS — S00432D Contusion of left ear, subsequent encounter: Secondary | ICD-10-CM

## 2022-12-17 DIAGNOSIS — S00432A Contusion of left ear, initial encounter: Secondary | ICD-10-CM

## 2022-12-17 NOTE — Telephone Encounter (Signed)
Insurance for today's visit was verified by phone;  Ref # [id number]

## 2022-12-17 NOTE — Progress Notes (Signed)
Otolaryngology Clinic Note Referring provider: ED HPI:  Paul Colon is a 89 y.o. male with dementia seen in urgent follow up for evaluation of auricular hematoma.  Initial visit 12/04/2022: Patient was seen in ED on 12/02/2022 for auricular hematoma on left, presumed due to fall. It was drained by ED but bolster was not placed. It has since reaccumulated and he presents today. He has dementia but does not appear to be in any pain. There is surrounding erythema and cellulitis with mild tenderness only to palpation. Mild postauricular fullness. He has longstanding HL and wears hearing aids but no other significant otologic history He is on PO cipfloxacin  Pt and daughter were advised to go to the ED because I was unable to place a bolster due to lack of supplies.  However they did not go to the ED and present today  12/05/2022: Daughter reports that she did not take him to the ED due to concern for long wait times.  No significant change in swelling or status changes, no ear drainage, no significant ear pain or discomfort.  He has not had an auricular hematoma prior to this episode and denies prior significant ear problems.  He has not been wearing his hearing aid on the left but he has been wearing his hearing aid on the right  He was then admitted to the hospital after a fall, and hematoma was noted to have reaccumulated. It was re-drained on 10/25. He has now been discharged and presents for follow up.  Today 12/17/22 He is otherwise doing well. Daughter brings him. No ear pain, drainage. Applying antibiotic ointment. Finished PO abx. On ASA 325 temporarily for hip.  PMHx: dementia, on ASA 2 - denies CAD or Heart problems Denies H&N surgery in past  Independent Review of Additional Tests or Records:  ED notes reviewed Head CT 08/24/2022: mastoids well aerated, ossicles and auricle unremarkable In patient notes reviewed  PMH/Meds/All/SocHx/FamHx/ROS:   Past Medical History:  Diagnosis  Date   Diabetes mellitus without complication (HCC)    Kidney disease    Severe dementia (HCC) 12/05/2022     Past Surgical History:  Procedure Laterality Date   ANTERIOR APPROACH HEMI HIP ARTHROPLASTY Right 12/07/2022   Procedure: ANTERIOR APPROACH HEMI HIP ARTHROPLASTY;  Surgeon: Samson Frederic, MD;  Location: [facility] OR;  Service: Orthopedics;  Laterality: Right;   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   Procedure: TOTAL HIP ARTHROPLASTY ANTERIOR APPROACH;  Surgeon: Durene Romans, MD;  Location: [facility] OR;  Service: Orthopedics;  Laterality: Left;    History reviewed. No pertinent family history.   Social Connections: Unknown (09/26/2021)   Received from [facility]   Social Network    Social Network: Not on file     Current Outpatient Medications:    acetaminophen (TYLENOL) 325 MG tablet, Take 2 tablets (650 mg total) by mouth every 6 (six) hours., Disp: , Rfl:    aspirin EC 81 MG tablet, Take 1 tablet by mouth twice daily for 27 days. Then, take 1 tablet by mouth daily indefinitely., Disp: 30 tablet, Rfl: 12   atorvastatin (LIPITOR) 20 MG tablet, Take 1 tablet (20 mg total) by mouth every evening., Disp: 30 tablet, Rfl: 0   clopidogrel (PLAVIX) 75 MG tablet, Take 1 tablet (75 mg total) by mouth daily for 27 days., Disp: 27 tablet, Rfl: 0   Difluprednate 0.05 % EMUL, Place 1 drop into the right eye 2 (two) times daily. Night time 1st, Disp: , Rfl:  finasteride (PROSCAR) 5 MG tablet, Take 5 mg by mouth every evening. For enlarged prostate, Disp: , Rfl:    metFORMIN (GLUCOPHAGE) 1000 MG tablet, Take 1,000 mg by mouth 2 (two) times daily with a meal., Disp: , Rfl:    Multiple Vitamins-Minerals (ONE-A-DAY MENS 50+ PO), Take 1 tablet by mouth in the morning., Disp: , Rfl:    mupirocin ointment (BACTROBAN) 2 %, Apply 1 Application topically 2 (two) times daily. Over the yellow gauze twice per day, Disp: 22 g, Rfl: 0   polyethylene glycol (MIRALAX / GLYCOLAX) 17 g packet,  Take 17 g by mouth daily as needed for mild constipation., Disp: , Rfl:    senna (SENOKOT) 8.6 MG TABS tablet, Take 1 tablet (8.6 mg total) by mouth 2 (two) times daily., Disp: , Rfl:    sodium chloride (MURO 128) 5 % ophthalmic solution, Place 1 drop into the right eye at bedtime., Disp: , Rfl:    sodium chloride 1 g tablet, Take 1 tablet (1 g total) by mouth 3 (three) times daily with meals., Disp: 90 tablet, Rfl: 2   timolol (TIMOPTIC) 0.5 % ophthalmic solution, Place 1 drop into both eyes daily., Disp: , Rfl:    Physical Exam:   Ht 5\' 11"  (1.803 m)   Wt 146 lb (66.2 kg)   BMI 20.36 kg/m    Salient findings:  CN II-XII intact Left EAC some cerumen, ME well aerated, TM intact Right auricle with bolster in place, removed. No hematoma reaccumulation. Incisions healing well. ME well aerated. No lesions of oral cavity/oropharynx No obviously palpable neck masses/lymphadenopathy/thyromegaly No respiratory distress or stridor Appropriately responds to questions currently  Pre:   Post drainage:   Today:   Procedures:  Bolster removed   Impression & Plans:  Paul Colon is a 89 y.o. male with reaccumulation of left auricular hematoma with likely cellulitis surrounding, now drained.  Left auricular hematoma - drained, no evidence of recollection. Bolster removed. Continue mupirocin for 2-3 more days until some crusting removed. Then ok for hearing aid. - Avoid trauma - Return precautions discussed - f/u PRN

## 2022-12-18 NOTE — Progress Notes (Deleted)
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  Incidental finding and not cause of his confusion Encephalopathy, multifactorial in setting hyponatremia, AKI and possibly side effect of Cipro in patient with baseline cognitive impairment.   ***  Subjective:  Paul Colon is a 89 year old male with DM II, kidney disease, and essential tremor whom I previously saw for dizziness presents today for transition of care from recent hospitalization for stroke.  History supplemented by his accompanying daughter and hospital notes.  MRI brain and CTA head and neck personally reviewed.    Patient was hospitalized on 10/23 for right hip fracture sustained in fall when he lost balance trying to get into a car.  Underwent fixation.  During hospitalization, he became confused.  Initially started on Cipro.  UA and CXR were negative for infection and Cipro was discontinued.  Na was 127 and Cr 1.38.  EEG revealed generalized background slowing but no epileptiform discharges or electrographic seizures.  MRI of brain revealed acute punctate infarct within the right temporal lobe medially adjacent to the lateral ventricle.  CTA head and neck revealed 70% narrowing of the proximal right subclavian artery but no LVO or hemodynamically significant stenosis.  2D echo revealed EF 55-60%.  LDL 59 and Hgb A1c was 8.4.  Confusion subsided in the hospital.  Discharged on ASA 81mg  and Plavix 75mg  daily for 3 weeks, followed by ASA alone.  PAST MEDICAL HISTORY: Past Medical History:  Diagnosis Date   Diabetes mellitus without complication (HCC)    Kidney disease    Severe dementia (HCC) 12/05/2022    MEDICATIONS: Current Outpatient Medications on File Prior to Visit  Medication Sig Dispense Refill   acetaminophen (TYLENOL) 325 MG tablet Take 2 tablets (650 mg total) by mouth every 6 (six)  hours.     aspirin EC 81 MG tablet Take 1 tablet by mouth twice daily for 27 days. Then, take 1 tablet by mouth daily indefinitely. 30 tablet 12   atorvastatin (LIPITOR) 20 MG tablet Take 1 tablet (20 mg total) by mouth every evening. 30 tablet 0   clopidogrel (PLAVIX) 75 MG tablet Take 1 tablet (75 mg total) by mouth daily for 27 days. 27 tablet 0   Difluprednate 0.05 % EMUL Place 1 drop into the right eye 2 (two) times daily. Night time 1st     finasteride (PROSCAR) 5 MG tablet Take 5 mg by mouth every evening. For enlarged prostate     metFORMIN (GLUCOPHAGE) 1000 MG tablet Take 1,000 mg by mouth 2 (two) times daily with a meal.     Multiple Vitamins-Minerals (ONE-A-DAY MENS 50+ PO) Take 1 tablet by mouth in the morning.     mupirocin ointment (BACTROBAN) 2 % Apply 1 Application topically 2 (two) times daily. Over the yellow gauze twice per day 22 g 0   polyethylene glycol (MIRALAX / GLYCOLAX) 17 g packet Take 17 g by mouth daily as needed for mild constipation.     senna (SENOKOT) 8.6 MG TABS tablet Take 1 tablet (8.6 mg total) by mouth 2 (two) times daily.     sodium chloride (MURO 128) 5 % ophthalmic solution Place 1 drop into the right eye at bedtime.     sodium chloride 1 g tablet Take 1 tablet (1 g total) by mouth 3 (three) times daily with meals. 90 tablet 2   timolol (TIMOPTIC) 0.5 % ophthalmic solution  Place 1 drop into both eyes daily.     No current facility-administered medications on file prior to visit.    ALLERGIES: Allergies  Allergen Reactions   Alogliptin Other (See Comments)    Unknown reaction    Penicillin G Rash    FAMILY HISTORY: No family history on file.    Objective:  *** General: No acute distress.  Patient appears ***-groomed.   Head:  Normocephalic/atraumatic Eyes:  Fundi examined but not visualized Neck: supple, no paraspinal tenderness, full range of motion Heart:  Regular rate and rhythm Lungs:  Clear to auscultation bilaterally Back: No  paraspinal tenderness Neurological Exam: alert and oriented.  Speech fluent and not dysarthric, language intact.  CN II-XII intact. Bulk and tone normal, muscle strength 5/5 throughout.  Sensation to light touch intact.  Deep tendon reflexes 2+ throughout, toes downgoing.  Finger to nose testing intact.  Gait normal, Romberg negative.   Shon Millet, DO  CC: ***

## 2022-12-19 ENCOUNTER — Ambulatory Visit: Payer: No Typology Code available for payment source | Admitting: Neurology

## 2022-12-21 ENCOUNTER — Ambulatory Visit (INDEPENDENT_AMBULATORY_CARE_PROVIDER_SITE_OTHER): Payer: No Typology Code available for payment source

## 2022-12-21 ENCOUNTER — Encounter (INDEPENDENT_AMBULATORY_CARE_PROVIDER_SITE_OTHER): Payer: Self-pay

## 2022-12-21 VITALS — Ht 71.0 in | Wt 146.0 lb

## 2022-12-21 DIAGNOSIS — S00432D Contusion of left ear, subsequent encounter: Secondary | ICD-10-CM | POA: Diagnosis not present

## 2022-12-21 DIAGNOSIS — S00432A Contusion of left ear, initial encounter: Secondary | ICD-10-CM

## 2022-12-22 ENCOUNTER — Other Ambulatory Visit: Payer: Self-pay

## 2022-12-22 ENCOUNTER — Encounter (HOSPITAL_COMMUNITY): Payer: Self-pay

## 2022-12-22 ENCOUNTER — Emergency Department (HOSPITAL_COMMUNITY)
Admission: EM | Admit: 2022-12-22 | Discharge: 2022-12-22 | Disposition: A | Discharge status: Home / Self Care | Payer: No Typology Code available for payment source | Attending: Emergency Medicine | Admitting: Emergency Medicine

## 2022-12-22 DIAGNOSIS — S01312A Laceration without foreign body of left ear, initial encounter: Secondary | ICD-10-CM | POA: Diagnosis not present

## 2022-12-22 DIAGNOSIS — S00432D Contusion of left ear, subsequent encounter: Secondary | ICD-10-CM

## 2022-12-22 DIAGNOSIS — Z79899 Other long term (current) drug therapy: Secondary | ICD-10-CM | POA: Diagnosis not present

## 2022-12-22 DIAGNOSIS — D6959 Other secondary thrombocytopenia: Secondary | ICD-10-CM

## 2022-12-22 DIAGNOSIS — Z7984 Long term (current) use of oral hypoglycemic drugs: Secondary | ICD-10-CM | POA: Insufficient documentation

## 2022-12-22 DIAGNOSIS — T50905A Adverse effect of unspecified drugs, medicaments and biological substances, initial encounter: Secondary | ICD-10-CM | POA: Diagnosis not present

## 2022-12-22 DIAGNOSIS — Z7902 Long term (current) use of antithrombotics/antiplatelets: Secondary | ICD-10-CM | POA: Insufficient documentation

## 2022-12-22 DIAGNOSIS — Z7982 Long term (current) use of aspirin: Secondary | ICD-10-CM | POA: Diagnosis not present

## 2022-12-22 DIAGNOSIS — S0991XA Unspecified injury of ear, initial encounter: Secondary | ICD-10-CM | POA: Diagnosis present

## 2022-12-22 DIAGNOSIS — X58XXXA Exposure to other specified factors, initial encounter: Secondary | ICD-10-CM | POA: Insufficient documentation

## 2022-12-22 LAB — CBG MONITORING, ED: Glucose-Capillary: 309 mg/dL — ABNORMAL HIGH (ref 70–99)

## 2022-12-22 MED ORDER — LIDOCAINE HCL 2 % IJ SOLN
INTRAMUSCULAR | Status: AC
  Filled 2022-12-22: qty 20

## 2022-12-22 MED ORDER — CEPHALEXIN 500 MG PO CAPS: 1000.0000 mg | ORAL_CAPSULE | Freq: Two times a day (BID) | ORAL | 0 refills | Status: DC

## 2022-12-22 NOTE — Discharge Instructions (Signed)
While you were in the emergency department, you had your ear wound repaired.  Like we discussed, we should hold the Plavix for at the next 5 days.  This will help prevent increased bleeding.  Like we discussed, this medication is started after a stroke to prevent recurrent strokes, but with the bleeding, that is riskier than continuing the medication.  I would recommend continuing to take your aspirin over that time.   You can take 1000mg  of Keflex 2 times per day for the next 1 week.  Please call Dr. Eliane Decree office on Monday to set up a follow-up appointment.

## 2022-12-22 NOTE — ED Provider Triage Note (Signed)
Emergency Medicine Provider Triage Evaluation Note  Paul Colon , a 89 y.o. male  was evaluated in triage.  Pt complains of bleeding from the left ear.  States he had a hematoma drained yesterday and feels the sutures are not holding.  Denies any other complaints.  Denies any dizziness, pus drainage from the area.  Review of Systems  Positive: As above Negative: As above  Physical Exam  BP (!) 84/51 (BP Location: Right Arm)   Pulse 95   Temp 97.8 F (36.6 C) (Oral)   Resp 18   Ht 5\' 11"  (1.803 m)   Wt 66 kg   SpO2 94%   BMI 20.29 kg/m  Gen:   Awake, no distress   Resp:  Normal effort  MSK:   Moves extremities without difficulty  Other:  Left ear with bloody gauze  Medical Decision Making  Medically screening exam initiated at 12:01 PM.  Appropriate orders placed.  River Shoults was informed that the remainder of the evaluation will be completed by another provider, this initial triage assessment does not replace that evaluation, and the importance of remaining in the ED until their evaluation is complete.     Arabella Merles, PA-C 12/22/22 1202

## 2022-12-22 NOTE — Consult Note (Signed)
Reason for Consult: Postprocedure ear bleeding Referring Physician: ER attending  Paul Colon is an 89 y.o. male.  HPI: Paul Colon is a 89 year old male who was seen by Dr. Allena Katz in the outpatient clinic.  Paul Colon suffered an auricular hematoma after falling recently.  He also has had a hip repair after which she was started on aspirin and Plavix.  They have continued both of those medications until presentation today.  He has bled through the whipstitch placed last week and bright red blood is dripping from the ear regularly.  Past Medical History:  Diagnosis Date   Diabetes mellitus without complication (HCC)    Kidney disease    Severe dementia (HCC) 12/05/2022    Past Surgical History:  Procedure Laterality Date   ANTERIOR APPROACH HEMI HIP ARTHROPLASTY Right 12/07/2022   Procedure: ANTERIOR APPROACH HEMI HIP ARTHROPLASTY;  Surgeon: Samson Frederic, MD;  Location: [facility] OR;  Service: Orthopedics;  Laterality: Right;   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   Procedure: TOTAL HIP ARTHROPLASTY ANTERIOR APPROACH;  Surgeon: Durene Romans, MD;  Location: [facility] OR;  Service: Orthopedics;  Laterality: Left;    History reviewed. No pertinent family history.  Social History:  reports that he has never smoked. He has never used smokeless tobacco. He reports that he does not drink alcohol and does not use drugs.  Allergies:  Allergies  Allergen Reactions   Alogliptin Other (See Comments)    Unknown reaction    Penicillin G Rash    Medications: I have reviewed the patient's current medications.  Results for orders placed or performed during the hospital encounter of 12/22/22 (from the past 48 hour(s))  CBG monitoring, ED     Status: Abnormal   Collection Time: 12/22/22 11:36 AM  Result Value Ref Range   Glucose-Capillary 309 (H) 70 - 99 mg/dL    Comment: Glucose reference range applies only to samples taken after fasting for at least 8 hours.   Comment 1 Notify RN    Comment 2  Document in Chart     No results found.  Review of Systems Blood pressure 122/62, pulse 78, temperature 98.1 F (36.7 C), temperature source Oral, resp. rate 17, height 5\' 11"  (1.803 m), weight 66 kg, SpO2 100%.  Physical Exam  Patient is hard of hearing but cooperative.  I examined him and performed the procedure with his daughter and son-in-law present in the room. Left auricle has several whipstitches placed with iodoform gauze.  The stitches are loosened and the patient has recurrence of his auricular hematoma with active bleeding.  The right ear is normal. Oral cavity normal External nose without abnormality and septum deviated Cranial nerves appear intact Neck without adenopathy or mass   Assessment/Plan:  Recurrence of auricular hematoma Platelet dysfunction secondary to medication x 2  I had a discussion with the daughter and son-in-law about the risk of infection and disfigurement of the auricle.  Given the ongoing bleeding, I do think removal of the sutures today with replacement with a more definitive auricular pack is indicated.  I think this gives him the best chance of controlling the bleeding and avoiding an infection.  I did discuss the risk of an infection which could result in the need to remove a portion of or the entirety of the auricle.  This is higher risk given the recurrent bleeding and need for a second procedure.  The family wishes to move forward with more definitive treatment of the problem.  I was able to find  several lengths of dental roll which were sutured in a through and through way anteriorly and posteriorly on the left auricle.  One was placed in the helical fold and the second in the antihelical fold.  This controlled the bleeding well.  Copious amounts of Betadine were used to clean the ear prior to placement of the packs and the packs themselves were soaked in Betadine.  Bacitracin ointment was placed on the entirety of the left ear at the end of the  procedure.  A mastoid dressing was then placed.  He tolerated this well and bleeding was adequately controlled.  It should be noted I did make a small incision on the anterior auricle to drain the recurring hematoma.  One of the anterior dental roll packs was placed over the incision to provide hemostasis.  Recommendations:  Ideally, the patient's Plavix and/or aspirin should be held until his bleeding is controlled.  This combined with his a regular trauma are likely the causes for his hematoma and recurrent bleeding.  I discussed this with the ER attending and we will leave the risk-benefit analysis of continuing and/or holding these medications up to the medical doctors.  2.    Patient should be maintained on an antibiotic while the packs are in place.  Augmentin would be the best choice        rather than using a quinolone given the risks of that class of medication and the patient this age with dementia.  3.    Patient should follow-up with Dr. Allena Katz on Monday for a wound check.  The family has his number.   Paul Brock Jr. 12/22/2022, 1:28 PM

## 2022-12-22 NOTE — ED Triage Notes (Signed)
Pt had procedure done on left ear yesterday. Pt's left ear started bleeding overnight last night. Dressing in place on left ear, family states dressing is sutured into ear.

## 2022-12-22 NOTE — ED Provider Notes (Signed)
[place] [facility] AT [facility] Provider Note   CSN: [id number] Arrival date & time: 12/22/22  1124     History  Chief Complaint  Patient presents with   Post-op Problem    Maurie Ketelhut is a 89 y.o. male.  89 year old male here today with bleeding from surgical site.  Patient had a hematoma in his left auricle drained yesterday.        Home Medications Prior to Admission medications   Medication Sig Start Date End Date Taking? Authorizing Provider  acetaminophen (TYLENOL) 325 MG tablet Take 2 tablets (650 mg total) by mouth every 6 (six) hours. 12/11/22  Yes Dameron, Nolberto Hanlon, DO  aspirin EC 81 MG tablet Take 1 tablet by mouth twice daily for 27 days. Then, take 1 tablet by mouth daily indefinitely. 12/11/22  Yes Dameron, Nolberto Hanlon, DO  atorvastatin (LIPITOR) 20 MG tablet Take 1 tablet (20 mg total) by mouth every evening. 01/25/22  Yes Vann, Jessica U, DO  cephALEXin (KEFLEX) 500 MG capsule Take 2 capsules (1,000 mg total) by mouth 2 (two) times daily. 12/22/22  Yes Anders Simmonds T, DO  clopidogrel (PLAVIX) 75 MG tablet Take 1 tablet (75 mg total) by mouth daily for 27 days. 12/12/22 01/08/23 Yes Dameron, Nolberto Hanlon, DO  Difluprednate 0.05 % EMUL Place 1 drop into the right eye 2 (two) times daily.   Yes [provider]  finasteride (PROSCAR) 5 MG tablet Take 5 mg by mouth every evening. For enlarged prostate   Yes [provider]  metFORMIN (GLUCOPHAGE) 1000 MG tablet Take 1,000 mg by mouth 2 (two) times daily with a meal.   Yes [provider]  Multiple Vitamins-Minerals (ONE-A-DAY MENS 50+ PO) Take 1 tablet by mouth in the morning.   Yes [provider]  mupirocin ointment (BACTROBAN) 2 % Apply 1 Application topically 2 (two) times daily. Over the yellow gauze twice per day 12/05/22  Yes Read Drivers, MD  Polyvinyl Alcohol-Povidone PF 1.4-0.6 % SOLN Apply to eye. 12/21/22  Yes [provider]  sodium chloride  (MURO 128) 5 % ophthalmic solution Place 1 drop into the right eye at bedtime.   Yes [provider]      Allergies    Alogliptin and Penicillin g    Review of Systems   Review of Systems  Physical Exam Updated Vital Signs BP 122/62   Pulse 78   Temp 98.1 F (36.7 C) (Oral)   Resp 17   Ht 5\' 11"  (1.803 m)   Wt 66 kg   SpO2 100%   BMI 20.29 kg/m  Physical Exam Vitals reviewed.  Constitutional:      Appearance: Normal appearance.  HENT:     Head:     Comments: When I entered the room, the ENT physician was repairing the patient's left auricle.  Exam a bit limited. Cardiovascular:     Rate and Rhythm: Normal rate.  Pulmonary:     Effort: Pulmonary effort is normal.  Skin:    General: Skin is warm.  Neurological:     Mental Status: He is alert.     ED Results / Procedures / Treatments   Labs (all labs ordered are listed, but only abnormal results are displayed) Labs Reviewed  CBG MONITORING, ED - Abnormal; Notable for the following components:      Result Value   Glucose-Capillary 309 (*)    All other components within normal limits    EKG None  Radiology No results found.  Procedures Procedures    Medications Ordered in ED Medications  lidocaine (XYLOCAINE) 2 % (with pres) injection (  Given by Other 12/22/22 1250)    ED Course/ Medical Decision Making/ A&P                                 Medical Decision Making 89 year old male here today with postoperative bleeding, left auricle.  Plan-patient with some postoperative bleeding.  Small amount, overall looks okay.  Blood pressures on the softer side, however he is not feeling headed or dizzy.  I do not believe he has lost a significant amount of blood.  Appreciative of the ENT being available and reevaluated patient.  Likely discharge.  Reassessment 1:30 PM-repair completed.  Patient will have to briefly stop Plavix.  Discussed this with the ENT physician, patient's family, and with his  history of dementia, risk of increased bleeding was deemed to be higher risk than continued anticoagulation.  I reviewed the patient's neurology note from 10/29.  Will discharge with recommendations, antibiotics.           Final Clinical Impression(s) / ED Diagnoses Final diagnoses:  Laceration of left ear without foreign body, initial encounter    Rx / DC Orders ED Discharge Orders          Ordered    cephALEXin (KEFLEX) 500 MG capsule  2 times daily        12/22/22 1336              Anders Simmonds T, DO 12/22/22 1338

## 2022-12-22 NOTE — Progress Notes (Signed)
Otolaryngology Clinic Note Referring provider: ED HPI:  Paul Colon is a 89 y.o. male with dementia seen in urgent follow up for evaluation of auricular hematoma.  Initial visit 12/04/2022: Patient was seen in ED on 12/02/2022 for auricular hematoma on left, presumed due to fall. It was drained by ED but bolster was not placed. It has since reaccumulated and he presents today. He has dementia but does not appear to be in any pain. There is surrounding erythema and cellulitis with mild tenderness only to palpation. Mild postauricular fullness. He has longstanding HL and wears hearing aids but no other significant otologic history He is on PO cipfloxacin  Pt and daughter were advised to go to the ED because I was unable to place a bolster due to lack of supplies.  However they did not go to the ED and present today  12/05/2022: Daughter reports that she did not take him to the ED due to concern for long wait times.  No significant change in swelling or status changes, no ear drainage, no significant ear pain or discomfort.  He has not had an auricular hematoma prior to this episode and denies prior significant ear problems.  He has not been wearing his hearing aid on the left but he has been wearing his hearing aid on the right  He was then admitted to the hospital after a fall, and hematoma was noted to have reaccumulated. It was re-drained on 10/25. He has now been discharged and presented for follow up on 11/4, where bolster was removed without evidence of hematoma rea-accumulation.  Today 12/21/2022 Daughter called for urgent appointment, reporting that hematoma had re-accumulated Daughter brings him. No ear pain, drainage. She reports that ear looked great until about Wednesday or Thursday, when she noticed swelling again. He had been laying on it. Continues ASA 81  PMHx: dementia, on ASA 81 - denies CAD or Heart problems Denies H&N surgery in past  Independent Review of Additional Tests or  Records:  ED notes reviewed Head CT 08/24/2022: mastoids well aerated, ossicles and auricle unremarkable In patient notes reviewed CT Head 12/05/22: mastoids and ME well aerated; bolster in place,some gas but no auricular hematoma reaccumulation noted then   Prior notes reviewed as well  PMH/Meds/All/SocHx/FamHx/ROS:   Past Medical History:  Diagnosis Date   Diabetes mellitus without complication (HCC)    Kidney disease    Severe dementia (HCC) 12/05/2022     Past Surgical History:  Procedure Laterality Date   ANTERIOR APPROACH HEMI HIP ARTHROPLASTY Right 12/07/2022   Procedure: ANTERIOR APPROACH HEMI HIP ARTHROPLASTY;  Surgeon: Samson Frederic, MD;  Location: [facility] OR;  Service: Orthopedics;  Laterality: Right;   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   Procedure: TOTAL HIP ARTHROPLASTY ANTERIOR APPROACH;  Surgeon: Durene Romans, MD;  Location: [facility] OR;  Service: Orthopedics;  Laterality: Left;    History reviewed. No pertinent family history.   Social Connections: Unknown (09/26/2021)   Received from [facility]   Social Network    Social Network: Not on file     Current Outpatient Medications:    acetaminophen (TYLENOL) 325 MG tablet, Take 2 tablets (650 mg total) by mouth every 6 (six) hours., Disp: , Rfl:    aspirin EC 81 MG tablet, Take 1 tablet by mouth twice daily for 27 days. Then, take 1 tablet by mouth daily indefinitely., Disp: 30 tablet, Rfl: 12   atorvastatin (LIPITOR) 20 MG tablet, Take 1 tablet (20 mg total) by mouth every evening.,  Disp: 30 tablet, Rfl: 0   clopidogrel (PLAVIX) 75 MG tablet, Take 1 tablet (75 mg total) by mouth daily for 27 days., Disp: 27 tablet, Rfl: 0   Difluprednate 0.05 % EMUL, Place 1 drop into the right eye 2 (two) times daily. Night time 1st, Disp: , Rfl:    finasteride (PROSCAR) 5 MG tablet, Take 5 mg by mouth every evening. For enlarged prostate, Disp: , Rfl:    metFORMIN (GLUCOPHAGE) 1000 MG tablet, Take 1,000 mg by mouth  2 (two) times daily with a meal., Disp: , Rfl:    Multiple Vitamins-Minerals (ONE-A-DAY MENS 50+ PO), Take 1 tablet by mouth in the morning., Disp: , Rfl:    mupirocin ointment (BACTROBAN) 2 %, Apply 1 Application topically 2 (two) times daily. Over the yellow gauze twice per day, Disp: 22 g, Rfl: 0   polyethylene glycol (MIRALAX / GLYCOLAX) 17 g packet, Take 17 g by mouth daily as needed for mild constipation., Disp: , Rfl:    senna (SENOKOT) 8.6 MG TABS tablet, Take 1 tablet (8.6 mg total) by mouth 2 (two) times daily., Disp: , Rfl:    sodium chloride (MURO 128) 5 % ophthalmic solution, Place 1 drop into the right eye at bedtime., Disp: , Rfl:    sodium chloride 1 g tablet, Take 1 tablet (1 g total) by mouth 3 (three) times daily with meals., Disp: 90 tablet, Rfl: 2   timolol (TIMOPTIC) 0.5 % ophthalmic solution, Place 1 drop into both eyes daily. (Patient not taking: Reported on 12/21/2022), Disp: , Rfl:    Physical Exam:   Ht 5\' 11"  (1.803 m)   Wt 146 lb (66.2 kg)   BMI 20.36 kg/m    Salient findings:  CN II-XII intact Right EAC some cerumen, ME well aerated, TM intact Left auricle with re-accumulation of hematoma as prior. Incisions prior well healed. No surrounding erythema No lesions of oral cavity/oropharynx No obviously palpable neck masses/lymphadenopathy/thyromegaly No respiratory distress or stridor Appropriately responds to questions currently  Prior visit after bolster removal    Procedures:  Procedure Note Pre-procedure diagnosis: Left recurrent auricular hematoma Post-procedure diagnosis: Same Procedure: Incision and Drainage of Left external ear hematoma (external ear), complicated - CPT 69005; mod 25 Complications: None apparent EBL: 20 mL Date: 12/21/2022  Indication: Paul Colon is a 89 y.o.  male with complicated left ear hematoma given reaccumulation, Aspirin use, and age. Given reaccumulation after prior drainage on 12/07/2022, decision was made for incision  and drainage of hematoma following discussion of risks/benefits of procedure. Risks including cosmetic deformity, reaccumulation, need for further procedures were explained. Patient was not able to provide consent due to dementia so consent from daughter was obtained.   The patient was identified as the correct patient and consent was obtained from daughter (see above). 1% lidocaine with 1-100,000 of epinephrine was injected over the hematoma area.  A small amount of the same local anesthetic mix was also injected into the posterior part of the auricle order to provide anesthesia during bolster placement.  Surrounding skin was cleansed, draped and prepped with betadine in usual fashion for procedure.   A 1.5 cm incision was made over prior incision at antihelix portion of the auricle through skin and dermis. Hemostat was used to dissect into hematoma pocket. There was a copious amount of blood and clot which was evacuated. The wound cavity was irrigated. Purulence was not encountered. Any Loculation and clots were broken up and removed with hemostat. Incision was left open to  avoid reaccumulation. No fluctuance was noted afterwards and auricle appeared much improved. A bolster was fashioned from xeroform and 3 separate pieces were sutured into place using 2-0 prolene sutures. The skin was cleansed and dressing placed   Patient tolerated this procedure well and there were no immediately apparent complications.    Impression & Plans:  Paul Colon is a 89 y.o. male with reaccumulation of left auricular hematoma re-accumulation, query as result of re-trauma after successful drainage and treatment on 12/07/2022  Left auricular hematoma - drained again after discussion.  - Continue mupirocin ointment; will avoid PO antibiotics given no evidence of infection and query if Cipro contributed to AMS last time - Return precautions discussed and will abx in case of concern for infection - f/u 1 week for  recheck - Will plan to keep bolster for 2 weeks this time; and then glasscock dressing  I have personally spent 49 minutes involved in face-to-face and non-face-to-face activities for this patient on the day of the visit.  Professional time spent includes the following activities, in addition to those noted in the documentation: preparing to see the patient (review of records, performing a medically appropriate examination and/or evaluation, counseling and educating the patient/family/caregiver, ordering medications, performing procedures (hematoma drainage), referring and communicating with other healthcare professionals, documenting clinical information in the electronic or other health record, independently interpreting results and communicating results with the patient/family/caregiver (daughter).

## 2022-12-24 ENCOUNTER — Telehealth (INDEPENDENT_AMBULATORY_CARE_PROVIDER_SITE_OTHER): Payer: Self-pay

## 2022-12-24 NOTE — Telephone Encounter (Signed)
Called to see if we cannot get pt an appointment for tomorrow cause he had some bleeding his past weekend per Dr. Allena Katz. Left voicemail for daughter to call back to schedule an appointment

## 2022-12-25 ENCOUNTER — Encounter (HOSPITAL_COMMUNITY): Payer: Self-pay | Admitting: Family Medicine

## 2022-12-25 ENCOUNTER — Encounter (HOSPITAL_COMMUNITY): Payer: Self-pay

## 2022-12-25 ENCOUNTER — Encounter (INDEPENDENT_AMBULATORY_CARE_PROVIDER_SITE_OTHER): Payer: Self-pay

## 2022-12-25 ENCOUNTER — Ambulatory Visit (INDEPENDENT_AMBULATORY_CARE_PROVIDER_SITE_OTHER): Payer: No Typology Code available for payment source

## 2022-12-25 ENCOUNTER — Emergency Department (HOSPITAL_COMMUNITY): Payer: No Typology Code available for payment source

## 2022-12-25 ENCOUNTER — Inpatient Hospital Stay (HOSPITAL_COMMUNITY)
Admission: EM | Admit: 2022-12-25 | Discharge: 2022-12-26 | DRG: 069 | Disposition: A | Discharge status: Home / Self Care | Payer: No Typology Code available for payment source | Source: Ambulatory Visit | Attending: Family Medicine | Admitting: Family Medicine

## 2022-12-25 ENCOUNTER — Other Ambulatory Visit: Payer: Self-pay

## 2022-12-25 ENCOUNTER — Telehealth (INDEPENDENT_AMBULATORY_CARE_PROVIDER_SITE_OTHER): Payer: Self-pay | Admitting: Otolaryngology

## 2022-12-25 ENCOUNTER — Inpatient Hospital Stay (HOSPITAL_COMMUNITY): Payer: No Typology Code available for payment source

## 2022-12-25 DIAGNOSIS — Z888 Allergy status to other drugs, medicaments and biological substances status: Secondary | ICD-10-CM

## 2022-12-25 DIAGNOSIS — S00432A Contusion of left ear, initial encounter: Secondary | ICD-10-CM | POA: Diagnosis present

## 2022-12-25 DIAGNOSIS — Z7984 Long term (current) use of oral hypoglycemic drugs: Secondary | ICD-10-CM | POA: Diagnosis not present

## 2022-12-25 DIAGNOSIS — N390 Urinary tract infection, site not specified: Secondary | ICD-10-CM | POA: Diagnosis present

## 2022-12-25 DIAGNOSIS — N400 Enlarged prostate without lower urinary tract symptoms: Secondary | ICD-10-CM | POA: Diagnosis present

## 2022-12-25 DIAGNOSIS — R1110 Vomiting, unspecified: Secondary | ICD-10-CM | POA: Diagnosis present

## 2022-12-25 DIAGNOSIS — E119 Type 2 diabetes mellitus without complications: Secondary | ICD-10-CM

## 2022-12-25 DIAGNOSIS — R509 Fever, unspecified: Secondary | ICD-10-CM | POA: Diagnosis present

## 2022-12-25 DIAGNOSIS — S00432D Contusion of left ear, subsequent encounter: Secondary | ICD-10-CM | POA: Diagnosis not present

## 2022-12-25 DIAGNOSIS — S7011XD Contusion of right thigh, subsequent encounter: Secondary | ICD-10-CM | POA: Diagnosis not present

## 2022-12-25 DIAGNOSIS — H269 Unspecified cataract: Secondary | ICD-10-CM | POA: Diagnosis present

## 2022-12-25 DIAGNOSIS — Z79899 Other long term (current) drug therapy: Secondary | ICD-10-CM

## 2022-12-25 DIAGNOSIS — R319 Hematuria, unspecified: Secondary | ICD-10-CM | POA: Diagnosis present

## 2022-12-25 DIAGNOSIS — D72829 Elevated white blood cell count, unspecified: Secondary | ICD-10-CM | POA: Diagnosis present

## 2022-12-25 DIAGNOSIS — E1122 Type 2 diabetes mellitus with diabetic chronic kidney disease: Secondary | ICD-10-CM | POA: Diagnosis present

## 2022-12-25 DIAGNOSIS — E785 Hyperlipidemia, unspecified: Secondary | ICD-10-CM | POA: Diagnosis present

## 2022-12-25 DIAGNOSIS — Z09 Encounter for follow-up examination after completed treatment for conditions other than malignant neoplasm: Secondary | ICD-10-CM

## 2022-12-25 DIAGNOSIS — Z7982 Long term (current) use of aspirin: Secondary | ICD-10-CM | POA: Diagnosis not present

## 2022-12-25 DIAGNOSIS — N1831 Chronic kidney disease, stage 3a: Secondary | ICD-10-CM | POA: Diagnosis present

## 2022-12-25 DIAGNOSIS — F0390 Unspecified dementia without behavioral disturbance: Secondary | ICD-10-CM | POA: Diagnosis present

## 2022-12-25 DIAGNOSIS — Z8673 Personal history of transient ischemic attack (TIA), and cerebral infarction without residual deficits: Secondary | ICD-10-CM

## 2022-12-25 DIAGNOSIS — Z880 Allergy status to penicillin: Secondary | ICD-10-CM | POA: Diagnosis not present

## 2022-12-25 DIAGNOSIS — R059 Cough, unspecified: Secondary | ICD-10-CM | POA: Diagnosis present

## 2022-12-25 DIAGNOSIS — W19XXXD Unspecified fall, subsequent encounter: Secondary | ICD-10-CM | POA: Diagnosis present

## 2022-12-25 DIAGNOSIS — G459 Transient cerebral ischemic attack, unspecified: Principal | ICD-10-CM | POA: Diagnosis present

## 2022-12-25 DIAGNOSIS — Z96641 Presence of right artificial hip joint: Secondary | ICD-10-CM | POA: Diagnosis present

## 2022-12-25 DIAGNOSIS — D649 Anemia, unspecified: Secondary | ICD-10-CM | POA: Diagnosis present

## 2022-12-25 DIAGNOSIS — D500 Iron deficiency anemia secondary to blood loss (chronic): Secondary | ICD-10-CM | POA: Diagnosis present

## 2022-12-25 DIAGNOSIS — N5082 Scrotal pain: Secondary | ICD-10-CM | POA: Diagnosis present

## 2022-12-25 DIAGNOSIS — R531 Weakness: Secondary | ICD-10-CM | POA: Diagnosis not present

## 2022-12-25 DIAGNOSIS — Z9181 History of falling: Secondary | ICD-10-CM

## 2022-12-25 LAB — COMPREHENSIVE METABOLIC PANEL
ALT: 15 U/L (ref 0–44)
AST: 19 U/L (ref 15–41)
Albumin: 3.1 g/dL — ABNORMAL LOW (ref 3.5–5.0)
Alkaline Phosphatase: 118 U/L (ref 38–126)
Anion gap: 11 (ref 5–15)
BUN: 24 mg/dL — ABNORMAL HIGH (ref 8–23)
CO2: 24 mmol/L (ref 22–32)
Calcium: 9.2 mg/dL (ref 8.9–10.3)
Chloride: 97 mmol/L — ABNORMAL LOW (ref 98–111)
Creatinine, Ser: 1.18 mg/dL (ref 0.61–1.24)
GFR, Estimated: 58 mL/min — ABNORMAL LOW (ref >60)
Glucose, Bld: 361 mg/dL — ABNORMAL HIGH (ref 70–99)
Potassium: 4.5 mmol/L (ref 3.5–5.1)
Sodium: 132 mmol/L — ABNORMAL LOW (ref 135–145)
Total Bilirubin: 0.5 mg/dL (ref <1.2)
Total Protein: 6.1 g/dL — ABNORMAL LOW (ref 6.5–8.1)

## 2022-12-25 LAB — CBC WITH DIFFERENTIAL/PLATELET
Abs Immature Granulocytes: 0.07 10*3/uL (ref 0.00–0.07)
Basophils Absolute: 0.1 10*3/uL (ref 0.0–0.1)
Basophils Relative: 0 %
Eosinophils Absolute: 0.2 10*3/uL (ref 0.0–0.5)
Eosinophils Relative: 1 %
HCT: 26.8 % — ABNORMAL LOW (ref 39.0–52.0)
Hemoglobin: 8.5 g/dL — ABNORMAL LOW (ref 13.0–17.0)
Immature Granulocytes: 1 %
Lymphocytes Relative: 7 %
Lymphs Abs: 0.8 10*3/uL (ref 0.7–4.0)
MCH: 30.2 pg (ref 26.0–34.0)
MCHC: 31.7 g/dL (ref 30.0–36.0)
MCV: 95.4 fL (ref 80.0–100.0)
Monocytes Absolute: 0.6 10*3/uL (ref 0.1–1.0)
Monocytes Relative: 5 %
Neutro Abs: 9.6 10*3/uL — ABNORMAL HIGH (ref 1.7–7.7)
Neutrophils Relative %: 86 %
Platelets: 479 10*3/uL — ABNORMAL HIGH (ref 150–400)
RBC: 2.81 MIL/uL — ABNORMAL LOW (ref 4.22–5.81)
RDW: 13.9 % (ref 11.5–15.5)
WBC: 11.3 10*3/uL — ABNORMAL HIGH (ref 4.0–10.5)
nRBC: 0 % (ref 0.0–0.2)

## 2022-12-25 LAB — URINALYSIS, W/ REFLEX TO CULTURE (INFECTION SUSPECTED)
Bacteria, UA: NONE SEEN
Bilirubin Urine: NEGATIVE
Glucose, UA: >=500 mg/dL — AB
Hgb urine dipstick: NEGATIVE
Ketones, ur: NEGATIVE mg/dL
Nitrite: NEGATIVE
Protein, ur: NEGATIVE mg/dL
Specific Gravity, Urine: 1.017 (ref 1.005–1.030)
pH: 5 (ref 5.0–8.0)

## 2022-12-25 LAB — FOLATE: Folate: 23.4 ng/mL (ref >5.9)

## 2022-12-25 LAB — IRON AND TIBC
Iron: 7 ug/dL — ABNORMAL LOW (ref 45–182)
Saturation Ratios: 3 % — ABNORMAL LOW (ref 17.9–39.5)
TIBC: 220 ug/dL — ABNORMAL LOW (ref 250–450)
UIBC: 213 ug/dL

## 2022-12-25 LAB — RESP PANEL BY RT-PCR (RSV, FLU A&B, COVID)  RVPGX2
Influenza A by PCR: NEGATIVE
Influenza B by PCR: NEGATIVE
Resp Syncytial Virus by PCR: NEGATIVE
SARS Coronavirus 2 by RT PCR: NEGATIVE

## 2022-12-25 LAB — RETICULOCYTES
Immature Retic Fract: 15.5 % (ref 2.3–15.9)
RBC.: 2.23 MIL/uL — ABNORMAL LOW (ref 4.22–5.81)
Retic Count, Absolute: 60.7 10*3/uL (ref 19.0–186.0)
Retic Ct Pct: 2.7 % (ref 0.4–3.1)

## 2022-12-25 LAB — VITAMIN B12: Vitamin B-12: 496 pg/mL (ref 180–914)

## 2022-12-25 LAB — GLUCOSE, CAPILLARY: Glucose-Capillary: 234 mg/dL — ABNORMAL HIGH (ref 70–99)

## 2022-12-25 LAB — FERRITIN: Ferritin: 312 ng/mL (ref 24–336)

## 2022-12-25 MED ORDER — ACETAMINOPHEN 325 MG PO TABS
650.0000 mg | ORAL_TABLET | Freq: Once | ORAL | Status: AC
  Administered 2022-12-25: 650 mg via ORAL
  Filled 2022-12-25: qty 2

## 2022-12-25 MED ORDER — ONDANSETRON HCL 4 MG/2ML IJ SOLN: 4.0000 mg | Freq: Four times a day (QID) | INTRAMUSCULAR | Status: DC | PRN

## 2022-12-25 MED ORDER — ONDANSETRON HCL 4 MG/2ML IJ SOLN: 4.0000 mg | Freq: Once | INTRAMUSCULAR | Status: DC

## 2022-12-25 MED ORDER — ACETAMINOPHEN 650 MG RE SUPP: 650.0000 mg | Freq: Once | RECTAL | Status: DC

## 2022-12-25 MED ORDER — LACTATED RINGERS IV SOLN: INTRAVENOUS | Status: DC

## 2022-12-25 MED ORDER — ACETAMINOPHEN 325 MG PO TABS: 650.0000 mg | ORAL_TABLET | Freq: Four times a day (QID) | ORAL | Status: DC | PRN

## 2022-12-25 MED ORDER — SODIUM CHLORIDE 0.9 % IV SOLN: 1.0000 g | INTRAVENOUS | Status: DC

## 2022-12-25 MED ORDER — DIFLUPREDNATE 0.05 % OP EMUL
1.0000 [drp] | Freq: Every day | OPHTHALMIC | Status: DC
Start: 2022-12-26 — End: 2022-12-26
  Administered 2022-12-26: 1 [drp] via OPHTHALMIC

## 2022-12-25 MED ORDER — ONDANSETRON HCL 4 MG/2ML IJ SOLN
4.0000 mg | Freq: Once | INTRAMUSCULAR | Status: AC
  Administered 2022-12-25: 4 mg via INTRAVENOUS
  Filled 2022-12-25: qty 2

## 2022-12-25 MED ORDER — POLYVINYL ALCOHOL 1.4 % OP SOLN
Freq: Every day | OPHTHALMIC | Status: DC
  Filled 2022-12-25: qty 15

## 2022-12-25 MED ORDER — ONDANSETRON HCL 4 MG PO TABS: 4.0000 mg | ORAL_TABLET | Freq: Four times a day (QID) | ORAL | Status: DC | PRN

## 2022-12-25 MED ORDER — ATORVASTATIN CALCIUM 10 MG PO TABS: 20.0000 mg | ORAL_TABLET | Freq: Every evening | ORAL | Status: DC

## 2022-12-25 MED ORDER — MUPIROCIN 2 % EX OINT
1.0000 | TOPICAL_OINTMENT | Freq: Two times a day (BID) | CUTANEOUS | Status: DC
  Administered 2022-12-26 (×2): 1 via TOPICAL
  Filled 2022-12-25: qty 22

## 2022-12-25 MED ORDER — SODIUM CHLORIDE (HYPERTONIC) 5 % OP SOLN
1.0000 [drp] | Freq: Every day | OPHTHALMIC | Status: DC
  Filled 2022-12-25: qty 15

## 2022-12-25 MED ORDER — FINASTERIDE 5 MG PO TABS: 5.0000 mg | ORAL_TABLET | Freq: Every evening | ORAL | Status: DC

## 2022-12-25 MED ORDER — ACETAMINOPHEN 650 MG RE SUPP: 650.0000 mg | Freq: Four times a day (QID) | RECTAL | Status: DC | PRN

## 2022-12-25 MED ORDER — DOXYCYCLINE HYCLATE 100 MG PO TABS
100.0000 mg | ORAL_TABLET | Freq: Two times a day (BID) | ORAL | Status: DC
  Administered 2022-12-26 (×2): 100 mg via ORAL
  Filled 2022-12-25 (×2): qty 1

## 2022-12-25 MED ORDER — LACTATED RINGERS IV BOLUS
1000.0000 mL | Freq: Once | INTRAVENOUS | Status: AC
  Administered 2022-12-25: 1000 mL via INTRAVENOUS

## 2022-12-25 MED ORDER — SODIUM CHLORIDE 0.9 % IV SOLN
1.0000 g | INTRAVENOUS | Status: DC
  Administered 2022-12-25: 1 g via INTRAVENOUS
  Filled 2022-12-25: qty 10

## 2022-12-25 MED ORDER — INSULIN ASPART 100 UNIT/ML IJ SOLN
0.0000 [IU] | Freq: Three times a day (TID) | INTRAMUSCULAR | Status: DC
  Administered 2022-12-26: 8 [IU] via SUBCUTANEOUS
  Administered 2022-12-26: 5 [IU] via SUBCUTANEOUS

## 2022-12-25 NOTE — ED Triage Notes (Signed)
Pt to ED via pov from doctor's office. Family stated that pt had an episode earlier where he was not acting like himself. Family states that pt was not able to walk b/c of weakness. Pt is alert and oriented to self on arrival. Pt's family states that patient is much more with it at this time. Pt stopped plavix 4 days ago.

## 2022-12-25 NOTE — ED Notes (Signed)
This paramedic walked in to meet patient and he was actively vomiting. Pt states he feels better now

## 2022-12-25 NOTE — H&P (Cosign Needed)
Hospital Admission History and Physical Service Pager: [phone]  Patient name: Paul Colon Medical record number: [id number] Date of Birth: 1929/03/23 Age: 89 y.o. Gender: male  Primary Care Provider: [facility] Consultants: none Code Status: Full for now, will need to have further discussion with daughter in the morning Preferred Emergency Contact:  Contact Information     Name Relation Home Work Mobile   Paul Colon,Paul Colon Daughter [phone]        Other Contacts   None on File    Chief Complaint: weakness  Assessment and Plan: Paul Colon is a 89 y.o. male PMH T2DM, HLD, cognitive decline/dementia, hyponatremia 2/2 SIADH, BPH, cataracts, recent right hip fracture s/p hip arthroplasty 12/07/22, recent CVA on DAPT 12/09/22 presenting with generalized weakness. Differential for presentation of this includes infection, possible source is his scrotum as it is erythematous and tender.  UA with moderate leukocytes, however no bacteria found on micro and patient is not having any urinary symptoms. Left ear/right hip infected hematoma possible however no tenderness or warmth. Patient febrile with mild leukocytosis and mild dry cough that started in ED, chest x-Colon unremarkable and respiratory viral panel negative.  Could also consider TIA given how quickly the symptoms of weakness resolved.  CT head negative for acute abnormality.  Could consider anemia, his hemoglobin 8.5 which I suspect is in the setting of illness, recurrent ear hematoma, and right hip hematoma from his recent hip surgery, or GI bleed given DAPT with DVT level ASA ppx. Assessment & Plan Weakness Transient weakness lasting minutes today.  Suspect this could be due to his infectious etiology or anemia. TIA possible given right sided involvement which resolved rapidly (ABCD2 score of 4 - moderate risk), did have recent CVA .  Fever suspected to be from possible scrotal infection.  Mild leukocytosis at 11.3.   Anemia likely in the setting of recurrent ear hematoma, or thigh hematoma from recent hip surgery.  Respiratory viral panel unremarkable.  CXR unremarkable.  Low suspicion for UTI given no urinary symptoms, just moderate leukocytes on UA.  Culture pending.   - admitted to [facility], med surg, Paul Colon attending -Tylenol 650 mg every 6 hours as needed for mild pain or fever - Zofran 4 mg every 6 hours as needed for nausea - Rocephin 1 g daily, doxycycline 100 mg every 12 hours empirically for epididymitis - Consider MRI, Echo, TSH, Lipid Panel for TIA workup (recently performed on admission in October) - Vital signs per floor - PT/OT eval - AM BMP, CBC Scrotal pain Diffuse tenderness, warmth, erythema to scrotum, worse with palpation of right testicle, no swelling. Daughter reports poor hygiene to that area since his hip surgery.  Could be secondary to epididymitis, cellulitis, or yeast. - Empirically treating for epididymitis with Rocephin and doxycycline as above - Scrotal ultrasound with Doppler pending - Can consider adding nystatin if more concerned for yeast infection Anemia Hemoglobin 8.5, appears his baseline is around 11.  Suspect his anemia is in the setting of his recurrent ear hematoma in addition to right thigh hematoma from recent right hip arthroplasty.  Component of GI bleed possible given patient recently put on DAPT + DVT ppx with ASA 81 BID and Plavix 75 daily, no gross blood in stool reported, daughter states the patient has never had a colonoscopy. Could be contributing to weakness. No known CAD or hx of MI per daughter. - POC occult blood from ED pending - Iron panel - Hold ASA and Plavix tonight,  consider restarting in AM for recent stroke and DVT ppx for hip repair - SCDs for now given anemia (is at high risk for DVT d/t recent hip fracture-recommend restarting as soon as hemoglobin stable) - AM CBC Type 2 diabetes mellitus (HCC) Holding home metformin 1000 mg twice daily.   A1c 8.4 two weeks ago. - Moderate SSI - CBG before meals and at bedtime Ear hematoma, left Recurrent left ear hematoma managed by ENT, Paul Colon. Thought to be related to previous fall. Appears stable, no warmth over the ear. (10/20 ED drained and put on PO ciprofloxacin, 10/22 initial ENT visit, 10/25 re-drained in ED, 11/8 drained again by ENT -topical mupirocin + bolster placed for 2 weeks, 11/10-re-drained by ED and put on keflex told to stop plavix, 11/12 ENT did not believe infected but okay to continue PO Abx) - Continue glasscock to left ear - ENT advised monitoring instead of additional drainage - Hold home keflex and ciprofloxacin  Chronic and Stable Problems:  Hyperlipidemia-continue atorvastatin 20 mg Recent CVA 10/27-small right temporal lobe infarct-recommended DAPT (ASA 81 BID and Plavix 75 daily for 30 days then ASA daily)---Told to stop Plavix on 11/10 due to hematoma  Cataracts-continue home eyedrops BPH-continue home finasteride 5 mg S/p R hemi hip arthroplasty-continue mupirocin ointment twice daily to surgical site, ASA 81 BID for 30 days, restart if AM CBC stable Hx of hyponatremia-sodium stable on admission at 132, monitor  FEN/GI: regular VTE Prophylaxis: SCDs for now  Disposition: inpatient status, med-surg  History of Present Illness:  Paul Colon is a 89 y.o. male with PMHx T2DM, CKD IIIa, HLD, BPH, dementia presenting with generalized weakness.    Daughter states that around 11 AM, patient was able to ambulate to the steps with his walker but then he was not able to go down the steps.  Daughter states that he said he cannot go down them.  She states that she feels like he slumped to his right side a bit, specifically his face and arm.  She states that his eyes were not focused on her.  Denies slurred speech.  States that the symptoms lasted for a few minutes and then he returned to his baseline.  Afterwards they went to their ENT appointment, she was advised to  call his PCP, and his PCPs office recommended he come to the ED for further evaluation.  Since being in the ED, daughter reports that patient's nose has been runny, he has had a dry cough, and has vomited after coughing spell.  Denies any sick contacts recently but states that he has been going to a bunch of doctors appointments.  Son-in-law reports that he has noticed scant blood after patient urinates. Noticed this after his surgery on 10/25.  States that it is not a large amount and he has not noticed any blood in his stool.  Daughter reports that there has been some difficulty cleaning his groin area.  Daughter denies fevers at home, diarrhea, difficulty urinating, increased urinary frequency, shortness of breath.  Patient denies dizziness.  He states nothing hurts.  Daughter states that he never complains about anything.  In the ED, patient was febrile at 101.2.  Vitals otherwise stable.  Respiratory viral panel negative.  UA with moderate leukocytes, otherwise unremarkable.  Urine culture pending. CXR unremarkable.   Review Of Systems: Per HPI with the following additions: none  Pertinent Past Medical History: T2DM CKD IIIa HLD BPH Cataracts Dementia  Remainder reviewed in history tab.   Pertinent Past  Surgical History: Anterior R hemi hip arthroplasty 12/07/22   Remainder reviewed in history tab.  Pertinent Social History: Tobacco use: Never Alcohol use: Never Other Substance use: No Lives with daughter and son in law  Pertinent Family History: None  Remainder reviewed in history tab.   Important Outpatient Medications: Aspirin 81mg  Atorvastatin 20mg   Keflex 500mg  BID - prescribed by Dr. Andria Meuse (ED) for left ear laceration 12/22/22 Ciprofloxacin 500 BID - prescribed by Paul Colon (ENT) for left ear recurrent hematoma Plavix 75mg  x30 days --- stopped on 12/21/22 d/t ear hematoma Finasteride 5mg   Metformin 1000mg  BID Refresh tears  Remainder reviewed in medication  history.   Objective: BP 135/61 (BP Location: Right Arm)   Pulse 88   Temp 98.6 F (37 C) (Oral)   Resp 18   Ht 5\' 11"  (1.803 m)   Wt 66 kg   SpO2 95%   BMI 20.29 kg/m  Exam: General: No acute distress, very hard of hearing Eyes: EOMI ENTM: Rhinorrhea noted at bilateral naris.  See left ear below, recurrent hematomas evaluated by ENT.  No warmth over left ear.  Moist mucous membranes. Neck: No JVD Cardiovascular: Regular rate and rhythm Respiratory: CTAB, no increased work of breathing on room air Gastrointestinal: Normal bowel sounds, soft, nontender, nondistended MSK: See image below left patient's right hip surgical site.  Appears to be hematoma over right hip as seen below.  No tenderness to palpation, surgical site appears clean dry and intact.  No leg swelling bilateral lower extremities. Neuro: Alert and oriented, unable to fully assess as patient is very hard of hearing Psych: Normal affect GU: Paul Colon, patient's daughter, patient's son-in-law present in room.  Diffuse erythema and tenderness to palpation over scrotum. Right testicle more tender to palpation than left.  Scant amount of dried blood noted in diaper under scrotum.  No lesions or ulcerations noted to scrotum.  Difficult to elicit cremasteric reflex.  No penile discharge, or ulceration.  No penile tenderness or erythema.     Labs:  CBC BMET  Recent Labs  Lab 12/25/22 1525  WBC 11.3*  HGB 8.5*  HCT 26.8*  PLT 479*   Recent Labs  Lab 12/25/22 1525  NA 132*  K 4.5  CL 97*  CO2 24  BUN 24*  CREATININE 1.18  GLUCOSE 361*  CALCIUM 9.2    Respiratory panel negative U/A with moderate leukocytes, glucose >500   EKG: Sinus tachycardia 102BPM, No ST-T changes   Imaging Studies Performed:  CXR 12/25/22 IMPRESSION: No active disease.  CT Head 12/25/22 IMPRESSION: 1. No evidence of acute intracranial abnormality. 2. Soft tissue thickening and or edema involving the left ear, partially  imaged. Correlate with direct inspection.   Ngoc Daughtridge, DO 12/26/2022, 12:01 AM PGY-1, [facility]  FPTS Intern pager: [phone], text pages welcome Secure chat group [facility] Teaching Service   Upper Level Addendum:  I have seen and evaluated this patient along with Dr. Rexene Alberts and reviewed the above note, making necessary revisions as appropriate.  I agree with the medical decision making and physical exam as noted above.  Levin Erp, MD PGY-3 [facility] Family Medicine Residency

## 2022-12-25 NOTE — Telephone Encounter (Signed)
See clinic note from today [employer]

## 2022-12-25 NOTE — ED Notes (Signed)
Received report from [place], EMT-P. Assuming care of this pt at this time.

## 2022-12-25 NOTE — ED Notes (Signed)
Pt back from CT @1544 

## 2022-12-25 NOTE — Hospital Course (Signed)
Weakness  UTI, fever IV abx  TIA?

## 2022-12-25 NOTE — ED Notes (Signed)
ED TO INPATIENT HANDOFF REPORT  ED Nurse Name and Phone #: Jeanice Lim [phone]  S Name/Age/Gender Paul Colon 89 y.o. male Room/Bed: 026C/026C  Code Status   Code Status: Full Code  Home/SNF/Other Home Patient oriented to: self, place, time, and situation Is this baseline? Yes   Triage Complete: Triage complete  Chief Complaint Weakness [R53.1]  Triage Note Pt to ED via pov from doctor's office. Family stated that pt had an episode earlier where he was not acting like himself. Family states that pt was not able to walk b/c of weakness. Pt is alert and oriented to self on arrival. Pt's family states that patient is much more with it at this time. Pt stopped plavix 4 days ago.    Allergies Allergies  Allergen Reactions   Alogliptin Other (See Comments)    Reaction type/severity unknown   Penicillin G Rash    Level of Care/Admitting Diagnosis ED Disposition     ED Disposition  Admit   Condition  --   Comment  Hospital Area: [facility] [[id number]]  Level of Care: Med-Surg [16]  May admit patient to [facility] or [facility] if equivalent level of care is available:: No  Covid Evaluation: Confirmed COVID Negative  Diagnosis: Weakness [241835]  Admitting Physician: Para March [[id number]]  Attending Physician: Billey Co [[id number]]  Certification:: I certify this patient will need inpatient services for at least 2 midnights  Expected Medical Readiness: 12/28/2022          B Medical/Surgery History Past Medical History:  Diagnosis Date   Diabetes mellitus without complication (HCC)    Kidney disease    Severe dementia (HCC) 12/05/2022   Past Surgical History:  Procedure Laterality Date   ANTERIOR APPROACH HEMI HIP ARTHROPLASTY Right 12/07/2022   Procedure: ANTERIOR APPROACH HEMI HIP ARTHROPLASTY;  Surgeon: Samson Frederic, MD;  Location: [facility] OR;  Service: Orthopedics;  Laterality: Right;   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    Procedure: TOTAL HIP ARTHROPLASTY ANTERIOR APPROACH;  Surgeon: Durene Romans, MD;  Location: [facility] OR;  Service: Orthopedics;  Laterality: Left;     A IV Location/Drains/Wounds Patient Lines/Drains/Airways Status     Active Line/Drains/Airways     Name Placement date Placement time Site Days   Peripheral IV 12/25/22 20 G Left Antecubital 12/25/22  1459  Antecubital  less than 1            Intake/Output Last 24 hours  Intake/Output Summary (Last 24 hours) at 12/25/2022 2131 Last data filed at 12/25/2022 1953 Gross per 24 hour  Intake 100.03 ml  Output --  Net 100.03 ml    Labs/Imaging Results for orders placed or performed during the hospital encounter of 12/25/22 (from the past 48 hour(s))  CBC with Differential/Platelet     Status: Abnormal   Collection Time: 12/25/22  3:25 PM  Result Value Ref Range   WBC 11.3 (H) 4.0 - 10.5 K/uL   RBC 2.81 (L) 4.22 - 5.81 MIL/uL   Hemoglobin 8.5 (L) 13.0 - 17.0 g/dL   HCT 65.7 (L) 84.6 - 96.2 %   MCV 95.4 80.0 - 100.0 fL   MCH 30.2 26.0 - 34.0 pg   MCHC 31.7 30.0 - 36.0 g/dL   RDW 95.2 84.1 - 32.4 %   Platelets 479 (H) 150 - 400 K/uL   nRBC 0.0 0.0 - 0.2 %   Neutrophils Relative % 86 %   Neutro Abs 9.6 (H) 1.7 - 7.7 K/uL   Lymphocytes Relative 7 %  Lymphs Abs 0.8 0.7 - 4.0 K/uL   Monocytes Relative 5 %   Monocytes Absolute 0.6 0.1 - 1.0 K/uL   Eosinophils Relative 1 %   Eosinophils Absolute 0.2 0.0 - 0.5 K/uL   Basophils Relative 0 %   Basophils Absolute 0.1 0.0 - 0.1 K/uL   Immature Granulocytes 1 %   Abs Immature Granulocytes 0.07 0.00 - 0.07 K/uL    Comment: Performed at [facility], [address], [place], [place] [address]  Comprehensive metabolic panel     Status: Abnormal   Collection Time: 12/25/22  3:25 PM  Result Value Ref Range   Sodium 132 (L) 135 - 145 mmol/L   Potassium 4.5 3.5 - 5.1 mmol/L   Chloride 97 (L) 98 - 111 mmol/L   CO2 24 22 - 32 mmol/L   Glucose, Bld 361 (H) 70 - 99 mg/dL    Comment:  Glucose reference range applies only to samples taken after fasting for at least 8 hours.   BUN 24 (H) 8 - 23 mg/dL   Creatinine, Ser 9.52 0.61 - 1.24 mg/dL   Calcium 9.2 8.9 - 84.1 mg/dL   Total Protein 6.1 (L) 6.5 - 8.1 g/dL   Albumin 3.1 (L) 3.5 - 5.0 g/dL   AST 19 15 - 41 U/L   ALT 15 0 - 44 U/L   Alkaline Phosphatase 118 38 - 126 U/L   Total Bilirubin 0.5 <1.2 mg/dL   GFR, Estimated 58 (L) >60 mL/min    Comment: (NOTE) Calculated using the CKD-EPI Creatinine Equation (2021)    Anion gap 11 5 - 15    Comment: Performed at [facility], [address] [place], [place] [address]  Resp panel by RT-PCR (RSV, Flu A&B, Covid) Anterior Nasal Swab     Status: None   Collection Time: 12/25/22  4:15 PM   Specimen: Anterior Nasal Swab  Result Value Ref Range   SARS Coronavirus 2 by RT PCR NEGATIVE NEGATIVE   Influenza A by PCR NEGATIVE NEGATIVE   Influenza B by PCR NEGATIVE NEGATIVE    Comment: (NOTE) The Xpert Xpress SARS-CoV-2/FLU/RSV plus assay is intended as an aid in the diagnosis of influenza from Nasopharyngeal swab specimens and should not be used as a sole basis for treatment. Nasal washings and aspirates are unacceptable for Xpert Xpress SARS-CoV-2/FLU/RSV testing.  Fact Sheet for Patients: BloggerCourse.com  Fact Sheet for Healthcare Providers: SeriousBroker.it  This test is not yet approved or cleared by the [place] FDA and has been authorized for detection and/or diagnosis of SARS-CoV-2 by FDA under an Emergency Use Authorization (EUA). This EUA will remain in effect (meaning this test can be used) for the duration of the COVID-19 declaration under Section 564(b)(1) of the Act, 21 U.S.C. section 360bbb-3(b)(1), unless the authorization is terminated or revoked.     Resp Syncytial Virus by PCR NEGATIVE NEGATIVE    Comment: (NOTE) Fact Sheet for Patients: BloggerCourse.com  Fact  Sheet for Healthcare Providers: SeriousBroker.it  This test is not yet approved or cleared by the [place] FDA and has been authorized for detection and/or diagnosis of SARS-CoV-2 by FDA under an Emergency Use Authorization (EUA). This EUA will remain in effect (meaning this test can be used) for the duration of the COVID-19 declaration under Section 564(b)(1) of the Act, 21 U.S.C. section 360bbb-3(b)(1), unless the authorization is terminated or revoked.  Performed at [facility], [address], [place], [place] [address]   Urinalysis,  w/ Reflex to Culture (Infection Suspected) -Urine, Clean Catch     Status: Abnormal   Collection Time: 12/25/22  4:21 PM  Result Value Ref Range   Specimen Source URINE, CLEAN CATCH    Color, Urine YELLOW YELLOW   APPearance HAZY (A) CLEAR   Specific Gravity, Urine 1.017 1.005 - 1.030   pH 5.0 5.0 - 8.0   Glucose, UA >=500 (A) NEGATIVE mg/dL   Hgb urine dipstick NEGATIVE NEGATIVE   Bilirubin Urine NEGATIVE NEGATIVE   Ketones, ur NEGATIVE NEGATIVE mg/dL   Protein, ur NEGATIVE NEGATIVE mg/dL   Nitrite NEGATIVE NEGATIVE   Leukocytes,Ua MODERATE (A) NEGATIVE   RBC / HPF 6-10 0 - 5 RBC/hpf   WBC, UA 21-50 0 - 5 WBC/hpf    Comment:        Reflex urine culture not performed if WBC <=10, OR if Squamous epithelial cells >5. If Squamous epithelial cells >5 suggest recollection.    Bacteria, UA NONE SEEN NONE SEEN   Squamous Epithelial / HPF 0-5 0 - 5 /HPF   Mucus PRESENT     Comment: Performed at [facility], [address], [place], [place] [address]   DG Chest Port 1 View  Result Date: 12/25/2022 CLINICAL DATA:  Shortness of breath EXAM: PORTABLE CHEST 1 VIEW COMPARISON:  Chest x-ray 12/02/2022 FINDINGS: The heart size and mediastinal contours are within normal limits. Both lungs are clear. The visualized skeletal structures are unremarkable. IMPRESSION: No active disease. Electronically Signed   By:  Darliss Cheney M.D.   On: 12/25/2022 18:56   CT Head Wo Contrast  Result Date: 12/25/2022 CLINICAL DATA:  Mental status change, unknown cause EXAM: CT HEAD WITHOUT CONTRAST TECHNIQUE: Contiguous axial images were obtained from the base of the skull through the vertex without intravenous contrast. RADIATION DOSE REDUCTION: This exam was performed according to the departmental dose-optimization program which includes automated exposure control, adjustment of the mA and/or kV according to patient size and/or use of iterative reconstruction technique. COMPARISON:  CT head October 28, 24. FINDINGS: Brain: No evidence of acute large vascular territory infarction, hemorrhage, hydrocephalus, extra-axial collection or mass lesion/mass effect. Vascular: No hyperdense vessel or unexpected calcification. Skull: Normal. Negative for fracture or focal lesion. Sinuses/Orbits: No acute finding. Other: Soft tissue thickening and or edema involving the left ear, partially imaged. IMPRESSION: 1. No evidence of acute intracranial abnormality. 2. Soft tissue thickening and or edema involving the left ear, partially imaged. Correlate with direct inspection. Electronically Signed   By: Feliberto Harts M.D.   On: 12/25/2022 15:50    Pending Labs Unresulted Labs (From admission, onward)     Start     Ordered   12/26/22 0500  CBC  Tomorrow morning,   R        12/25/22 2101   12/26/22 0500  Basic metabolic panel  Tomorrow morning,   R        12/25/22 2101   12/25/22 2100  Vitamin B12  (Anemia Panel (PNL))  Once,   R        12/25/22 2101   12/25/22 2100  Folate  (Anemia Panel (PNL))  Once,   R        12/25/22 2101   12/25/22 2100  Iron and TIBC  (Anemia Panel (PNL))  Once,   R        12/25/22 2101   12/25/22 2100  Ferritin  (Anemia Panel (PNL))  Once,   R  12/25/22 2101   12/25/22 2100  Reticulocytes  (Anemia Panel (PNL))  Once,   R        12/25/22 2101   12/25/22 1621  Urine Culture  Once,   R        12/25/22  1621            Vitals/Pain Today's Vitals   12/25/22 1830 12/25/22 1925 12/25/22 1955 12/25/22 2100  BP: (!) 134/59  (!) 132/57 (!) 127/59  Pulse: 98  98 92  Resp: (!) 24  (!) 21 19  Temp:  99.3 F (37.4 C) 98 F (36.7 C)   TempSrc:  Oral Oral   SpO2: 94%  92% 95%  Weight:      Height:      PainSc:        Isolation Precautions No active isolations  Medications Medications  atorvastatin (LIPITOR) tablet 20 mg (has no administration in time range)  finasteride (PROSCAR) tablet 5 mg (has no administration in time range)  Difluprednate 0.05 % EMUL 1 drop (has no administration in time range)  Polyvinyl Alcohol-Povidone PF 1.4-0.6 % SOLN (has no administration in time range)  mupirocin ointment (BACTROBAN) 2 % 1 Application (has no administration in time range)  sodium chloride (MURO 128) 5 % ophthalmic solution 1 drop (has no administration in time range)  acetaminophen (TYLENOL) tablet 650 mg (has no administration in time range)    Or  acetaminophen (TYLENOL) suppository 650 mg (has no administration in time range)  ondansetron (ZOFRAN) tablet 4 mg (has no administration in time range)    Or  ondansetron (ZOFRAN) injection 4 mg (has no administration in time range)  insulin aspart (novoLOG) injection 0-15 Units (has no administration in time range)  ondansetron (ZOFRAN) injection 4 mg (4 mg Intravenous Given 12/25/22 1734)  acetaminophen (TYLENOL) tablet 650 mg (650 mg Oral Given 12/25/22 1613)  lactated ringers bolus 1,000 mL (0 mLs Intravenous Stopped 12/25/22 1820)    Mobility walks with device     Focused Assessments Cardiac Assessment Handoff:    Lab Results  Component Value Date   CKTOTAL 73 01/20/2022   No results found for: "DDIMER" Does the Patient currently have chest pain? No   , Neuro Assessment Handoff:  Swallow screen pass? Yes          Neuro Assessment: Exceptions to WDL Neuro Checks:      Has TPA been given? No If patient is a  Neuro Trauma and patient is going to OR before floor call report to 4N Charge nurse: [phone] or [phone]  , Pulmonary Assessment Handoff:  Lung sounds:   O2 Device: Room Air      R Recommendations: See Admitting Provider Note  Report given to:   Additional Notes:

## 2022-12-25 NOTE — ED Provider Notes (Signed)
EMERGENCY DEPARTMENT AT [facility] Provider Note   CSN: [id number] Arrival date & time: 12/25/22  1358     History  Chief Complaint  Patient presents with   Altered Mental Status    Ranier Vanderwoude is a 89 y.o. male.  89 year old male presents with acute onset of trouble walking.  Patient uses a cane and walker at baseline.  Patient states that his legs his gave out on him.  He does have a history of Plavix use but stopped 4 days ago.  Symptoms have gradually improved since he has been here.  Of note, patient had right hip surgery done 2 weeks ago.  No reported history of drainage from the area.  Daughter denies any cough.  Has had some blood from his penis.  Patient also had a history of left ear injury and was seen by ENT for that today.  Daughter is at bedside      Home Medications Prior to Admission medications   Medication Sig Start Date End Date Taking? Authorizing Provider  acetaminophen (TYLENOL) 325 MG tablet Take 2 tablets (650 mg total) by mouth every 6 (six) hours. 12/11/22   Dameron, Nolberto Hanlon, DO  aspirin EC 81 MG tablet Take 1 tablet by mouth twice daily for 27 days. Then, take 1 tablet by mouth daily indefinitely. 12/11/22   Dameron, Nolberto Hanlon, DO  atorvastatin (LIPITOR) 20 MG tablet Take 1 tablet (20 mg total) by mouth every evening. 01/25/22   Joseph Art, DO  cephALEXin (KEFLEX) 500 MG capsule Take 2 capsules (1,000 mg total) by mouth 2 (two) times daily. 12/22/22   Arletha Pili, DO  clopidogrel (PLAVIX) 75 MG tablet Take 1 tablet (75 mg total) by mouth daily for 27 days. 12/12/22 01/08/23  Dameron, Nolberto Hanlon, DO  Difluprednate 0.05 % EMUL Place 1 drop into the right eye 2 (two) times daily.    [provider]  finasteride (PROSCAR) 5 MG tablet Take 5 mg by mouth every evening. For enlarged prostate    [provider]  metFORMIN (GLUCOPHAGE) 1000 MG tablet Take 1,000 mg by mouth 2 (two) times daily with a meal.    [provider]  Multiple Vitamins-Minerals (ONE-A-DAY MENS 50+ PO) Take 1 tablet by mouth in the morning.    [provider]  mupirocin ointment (BACTROBAN) 2 % Apply 1 Application topically 2 (two) times daily. Over the yellow gauze twice per day 12/05/22   Read Drivers, MD  Polyvinyl Alcohol-Povidone PF 1.4-0.6 % SOLN Apply to eye. 12/21/22   [provider]  sodium chloride (MURO 128) 5 % ophthalmic solution Place 1 drop into the right eye at bedtime.    [provider]      Allergies    Alogliptin and Penicillin g    Review of Systems   Review of Systems  All other systems reviewed and are negative.   Physical Exam Updated Vital Signs BP 132/77   Pulse (!) 104   Temp 100.2 F (37.9 C) (Oral)   Resp 16   Ht 1.803 m (5\' 11" )   Wt 66 kg   SpO2 93%   BMI 20.29 kg/m  Physical Exam Vitals and nursing note reviewed.  Constitutional:      General: He is not in acute distress.    Appearance: Normal appearance. He is well-developed. He is not toxic-appearing.  HENT:     Head: Normocephalic and atraumatic.     Ears:   Eyes:  General: Lids are normal.     Conjunctiva/sclera: Conjunctivae normal.     Pupils: Pupils are equal, round, and reactive to light.  Neck:     Thyroid: No thyroid mass.     Trachea: No tracheal deviation.  Cardiovascular:     Rate and Rhythm: Normal rate and regular rhythm.     Heart sounds: Normal heart sounds. No murmur heard.    No gallop.  Pulmonary:     Effort: Pulmonary effort is normal. No respiratory distress.     Breath sounds: Normal breath sounds. No stridor. No decreased breath sounds, wheezing, rhonchi or rales.  Abdominal:     General: There is no distension.     Palpations: Abdomen is soft.     Tenderness: There is no abdominal tenderness. There is no rebound.  Musculoskeletal:        General: No tenderness. Normal range of motion.     Cervical back: Normal range of motion and neck supple.        Legs:  Skin:    General: Skin is warm and dry.     Findings: No abrasion or rash.  Neurological:     General: No focal deficit present.     Mental Status: He is alert and oriented to person, place, and time. Mental status is at baseline.     GCS: GCS eye subscore is 4. GCS verbal subscore is 5. GCS motor subscore is 6.     Cranial Nerves: Cranial nerves are intact. No cranial nerve deficit.     Sensory: No sensory deficit.     Motor: Tremor present.     Comments: Strength is 5 of 5 in upper as well as lower extremity.  Psychiatric:        Attention and Perception: Attention normal.        Speech: Speech normal.        Behavior: Behavior normal.    ED Results / Procedures / Treatments   Labs (all labs ordered are listed, but only abnormal results are displayed) Labs Reviewed  CBC WITH DIFFERENTIAL/PLATELET - Abnormal; Notable for the following components:      Result Value   WBC 11.3 (*)    RBC 2.81 (*)    Hemoglobin 8.5 (*)    HCT 26.8 (*)    Platelets 479 (*)    Neutro Abs 9.6 (*)    All other components within normal limits  COMPREHENSIVE METABOLIC PANEL  URINALYSIS, W/ REFLEX TO CULTURE (INFECTION SUSPECTED)    EKG None  Radiology CT Head Wo Contrast  Result Date: 12/25/2022 CLINICAL DATA:  Mental status change, unknown cause EXAM: CT HEAD WITHOUT CONTRAST TECHNIQUE: Contiguous axial images were obtained from the base of the skull through the vertex without intravenous contrast. RADIATION DOSE REDUCTION: This exam was performed according to the departmental dose-optimization program which includes automated exposure control, adjustment of the mA and/or kV according to patient size and/or use of iterative reconstruction technique. COMPARISON:  CT head October 28, 24. FINDINGS: Brain: No evidence of acute large vascular territory infarction, hemorrhage, hydrocephalus, extra-axial collection or mass lesion/mass effect. Vascular: No hyperdense vessel or unexpected  calcification. Skull: Normal. Negative for fracture or focal lesion. Sinuses/Orbits: No acute finding. Other: Soft tissue thickening and or edema involving the left ear, partially imaged. IMPRESSION: 1. No evidence of acute intracranial abnormality. 2. Soft tissue thickening and or edema involving the left ear, partially imaged. Correlate with direct inspection. Electronically Signed   By: Juluis Mire.D.  On: 12/25/2022 15:50    Procedures Procedures    Medications Ordered in ED Medications  ondansetron (ZOFRAN) injection 4 mg (0 mg Intravenous Hold 12/25/22 1544)    ED Course/ Medical Decision Making/ A&P                                 Medical Decision Making Amount and/or Complexity of Data Reviewed Labs: ordered. Radiology: ordered. ECG/medicine tests: ordered.  Risk OTC drugs. Prescription drug management.  Patient here with weakness and trouble walking.  Concern for possible CVA.  Head CT presentation showed no acute findings.  Patient found to be febrile here.  Chest x-ray per interpretation shows no acute findings.  Urinalysis is consistent with infection.  Started on IV Rocephin.  Patient possibly could have had a TIA although I think likely the infectious process this was causing his symptoms.  Was given Tylenol for his fever.  I also had a few bouts of emesis and was given IV fluids and Zofran for this.  That is since improved.  Abdominal exam is benign.  No concern for intra-abdominal process.  COVID and flu test negative.  Slight drop in patient's hemoglobin however he denies any GI bleeding symptoms.  Will have nurses guaiac patient's stool.  Had long discussion with patient's family member at bedside and informed her of patient's condition.  CRITICAL CARE Performed by: Toy Baker Total critical care time: 60 minutes Critical care time was exclusive of separately billable procedures and treating other patients. Critical care was necessary to treat or prevent  imminent or life-threatening deterioration. Critical care was time spent personally by me on the following activities: development of treatment plan with patient and/or surrogate as well as nursing, discussions with consultants, evaluation of patient's response to treatment, examination of patient, obtaining history from patient or surrogate, ordering and performing treatments and interventions, ordering and review of laboratory studies, ordering and review of radiographic studies, pulse oximetry and re-evaluation of patient's condition.          Final Clinical Impression(s) / ED Diagnoses Final diagnoses:  None    Rx / DC Orders ED Discharge Orders     None         Lorre Nick, MD 12/25/22 [phone]

## 2022-12-25 NOTE — ED Notes (Signed)
Patient is back from imaging. 

## 2022-12-25 NOTE — ED Notes (Signed)
Patient transported to CT 

## 2022-12-25 NOTE — Progress Notes (Signed)
Otolaryngology Clinic Note Referring provider: ED HPI:  Paul Colon is a 89 y.o. male with dementia seen in urgent follow up for evaluation of auricular hematoma.  Initial visit 12/04/2022: Patient was seen in ED on 12/02/2022 for auricular hematoma on left, presumed due to fall. It was drained by ED but bolster was not placed. It has since reaccumulated and he presents today. He has dementia but does not appear to be in any pain. There is surrounding erythema and cellulitis with mild tenderness only to palpation. Mild postauricular fullness. He has longstanding HL and wears hearing aids but no other significant otologic history He is on PO cipfloxacin  Pt and daughter were advised to go to the ED because I was unable to place a bolster due to lack of supplies.  However they did not go to the ED and present today  12/05/2022: Daughter reports that she did not take him to the ED due to concern for long wait times.  No significant change in swelling or status changes, no ear drainage, no significant ear pain or discomfort.  He has not had an auricular hematoma prior to this episode and denies prior significant ear problems.  He has not been wearing his hearing aid on the left but he has been wearing his hearing aid on the right  He was then admitted to the hospital after a fall, and hematoma was noted to have reaccumulated. It was re-drained on 10/25. He has now been discharged and presented for follow up on 11/4, where bolster was removed without evidence of hematoma rea-accumulation.  12/21/2022 Daughter called for urgent appointment, reporting that hematoma had re-accumulated Daughter brings him. No ear pain, drainage. She reports that ear looked great until about Wednesday or Thursday, when she noticed swelling again. He had been laying on it. Continues ASA 81  Hematoma was re-drained and then he had bleeding around the bolster and some re-accumulation, re-drained by Dr. Elijah Birk on  12/23/2022  12/25/2022: Patient returns for follow up given re-drainage. He is feeling quite weak per daughter, and has a new onset right hand tremor. There is a small hematoma formation around the bolster. No fevers, discomfort, redness. They have been doing their best to avoid traumatizing the area. He is on antibiotics and ointment. The pressure dressing has been removed.  PMHx: dementia, on ASA 48 - denies CAD or Heart problems Denies H&N surgery in past  Independent Review of Additional Tests or Records:  ED notes reviewed Head CT 08/24/2022: mastoids well aerated, ossicles and auricle unremarkable In patient notes reviewed CT Head 12/05/22: mastoids and ME well aerated; bolster in place,some gas but no auricular hematoma reaccumulation noted then   Prior notes reviewed as well  PMH/Meds/All/SocHx/FamHx/ROS:   Past Medical History:  Diagnosis Date   Diabetes mellitus without complication (HCC)    Kidney disease    Severe dementia (HCC) 12/05/2022     Past Surgical History:  Procedure Laterality Date   ANTERIOR APPROACH HEMI HIP ARTHROPLASTY Right 12/07/2022   Procedure: ANTERIOR APPROACH HEMI HIP ARTHROPLASTY;  Surgeon: Samson Frederic, MD;  Location: [facility] OR;  Service: Orthopedics;  Laterality: Right;   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   Procedure: TOTAL HIP ARTHROPLASTY ANTERIOR APPROACH;  Surgeon: Durene Romans, MD;  Location: [facility] OR;  Service: Orthopedics;  Laterality: Left;    No family history on file.   Social Connections: Unknown (09/26/2021)   Received from [facility]   Social Network    Social Network: Not on  file    No current facility-administered medications for this visit.  Current Outpatient Medications:    acetaminophen (TYLENOL) 325 MG tablet, Take 2 tablets (650 mg total) by mouth every 6 (six) hours., Disp: , Rfl:    aspirin EC 81 MG tablet, Take 1 tablet by mouth twice daily for 27 days. Then, take 1 tablet by mouth daily  indefinitely., Disp: 30 tablet, Rfl: 12   atorvastatin (LIPITOR) 20 MG tablet, Take 1 tablet (20 mg total) by mouth every evening., Disp: 30 tablet, Rfl: 0   cephALEXin (KEFLEX) 500 MG capsule, Take 2 capsules (1,000 mg total) by mouth 2 (two) times daily., Disp: 28 capsule, Rfl: 0   clopidogrel (PLAVIX) 75 MG tablet, Take 1 tablet (75 mg total) by mouth daily for 27 days., Disp: 27 tablet, Rfl: 0   Difluprednate 0.05 % EMUL, Place 1 drop into the right eye 2 (two) times daily., Disp: , Rfl:    finasteride (PROSCAR) 5 MG tablet, Take 5 mg by mouth every evening. For enlarged prostate, Disp: , Rfl:    metFORMIN (GLUCOPHAGE) 1000 MG tablet, Take 1,000 mg by mouth 2 (two) times daily with a meal., Disp: , Rfl:    Multiple Vitamins-Minerals (ONE-A-DAY MENS 50+ PO), Take 1 tablet by mouth in the morning., Disp: , Rfl:    mupirocin ointment (BACTROBAN) 2 %, Apply 1 Application topically 2 (two) times daily. Over the yellow gauze twice per day, Disp: 22 g, Rfl: 0   Polyvinyl Alcohol-Povidone PF 1.4-0.6 % SOLN, Apply to eye., Disp: , Rfl:    sodium chloride (MURO 128) 5 % ophthalmic solution, Place 1 drop into the right eye at bedtime., Disp: , Rfl:   Facility-Administered Medications Ordered in Other Visits:    ondansetron (ZOFRAN) injection 4 mg, 4 mg, Intravenous, Once, Lorre Nick, MD   Physical Exam:   There were no vitals taken for this visit.   Salient findings:  CN II-XII intact Right EAC some cerumen, ME well aerated, TM intact Left auricle with small re-accumulation of hematoma around dental rolls. Incisions prior well healed. No surrounding erythema, not tender. Right auricle without hematoma. No lesions of oral cavity/oropharynx No obviously palpable neck masses/lymphadenopathy/thyromegaly No respiratory distress or stridor Appropriately responds to questions currently but right hand tremulous. No obvious focal signs   Prior visit after bolster removal    Procedures:  None  today  Impression & Plans:  Paul Colon is a 89 y.o. male with reaccumulation of left auricular hematoma re-accumulation, query as result of re-trauma after successful drainage and treatment on 12/07/2022 and then multiple times in November. He's had a hematoma reaccumulate 4 times and now with another small reaccumulation  Left auricular hematoma - s/p multiple re-drainages on plavix - He's had multiple reaccumulations and there is some concern for trauma, though daughter has been doing a good job with it.  Melina Fiddler discussed the risks of long-term hematoma formation, and I think given multiple reaccumulations and his frailty, collectively decided to observe for now - Continue mupirocin ointment; can finish PO abx but no infection at this time so could discontinue - Return precautions discussed - Will keep bolster in place for 2 weeks - No signs of infection currently - Placed glasscock dressing over the ear to avoid trauma. Advised to keep this on. - f/u 1 week for recheck   - for the weakness which is recent onset, advised to go to ED; daughter will take him

## 2022-12-25 NOTE — ED Notes (Signed)
Pt daughter requested to be in the room when Levin Erp, MD speaks with pt.

## 2022-12-25 NOTE — ED Notes (Signed)
Pt unable to urinate at this time.  

## 2022-12-26 ENCOUNTER — Other Ambulatory Visit (HOSPITAL_COMMUNITY): Payer: Self-pay

## 2022-12-26 DIAGNOSIS — R531 Weakness: Secondary | ICD-10-CM

## 2022-12-26 LAB — CBC
HCT: 23.3 % — ABNORMAL LOW (ref 39.0–52.0)
HCT: 25.2 % — ABNORMAL LOW (ref 39.0–52.0)
Hemoglobin: 7.7 g/dL — ABNORMAL LOW (ref 13.0–17.0)
Hemoglobin: 8 g/dL — ABNORMAL LOW (ref 13.0–17.0)
MCH: 31 pg (ref 26.0–34.0)
MCH: 30.4 pg (ref 26.0–34.0)
MCHC: 33 g/dL (ref 30.0–36.0)
MCHC: 31.7 g/dL (ref 30.0–36.0)
MCV: 94 fL (ref 80.0–100.0)
MCV: 95.8 fL (ref 80.0–100.0)
Platelets: 368 10*3/uL (ref 150–400)
Platelets: 409 10*3/uL — ABNORMAL HIGH (ref 150–400)
RBC: 2.48 MIL/uL — ABNORMAL LOW (ref 4.22–5.81)
RBC: 2.63 MIL/uL — ABNORMAL LOW (ref 4.22–5.81)
RDW: 13.9 % (ref 11.5–15.5)
RDW: 14.1 % (ref 11.5–15.5)
WBC: 13.9 10*3/uL — ABNORMAL HIGH (ref 4.0–10.5)
WBC: 11.9 10*3/uL — ABNORMAL HIGH (ref 4.0–10.5)
nRBC: 0 % (ref 0.0–0.2)
nRBC: 0 % (ref 0.0–0.2)

## 2022-12-26 LAB — GLUCOSE, CAPILLARY
Glucose-Capillary: 231 mg/dL — ABNORMAL HIGH (ref 70–99)
Glucose-Capillary: 272 mg/dL — ABNORMAL HIGH (ref 70–99)

## 2022-12-26 LAB — BASIC METABOLIC PANEL
Anion gap: 8 (ref 5–15)
BUN: 18 mg/dL (ref 8–23)
CO2: 25 mmol/L (ref 22–32)
Calcium: 8.5 mg/dL — ABNORMAL LOW (ref 8.9–10.3)
Chloride: 97 mmol/L — ABNORMAL LOW (ref 98–111)
Creatinine, Ser: 1.04 mg/dL (ref 0.61–1.24)
GFR, Estimated: >60 mL/min (ref >60)
Glucose, Bld: 253 mg/dL — ABNORMAL HIGH (ref 70–99)
Potassium: 4.4 mmol/L (ref 3.5–5.1)
Sodium: 130 mmol/L — ABNORMAL LOW (ref 135–145)

## 2022-12-26 MED ORDER — FERROUS SULFATE 325 (65 FE) MG PO TABS
324.0000 mg | ORAL_TABLET | ORAL | 0 refills | Status: DC
  Filled 2022-12-26: qty 30, 60d supply, fill #0

## 2022-12-26 MED ORDER — PANTOPRAZOLE SODIUM 40 MG IV SOLR
40.0000 mg | Freq: Two times a day (BID) | INTRAVENOUS | Status: DC
  Filled 2022-12-26: qty 10

## 2022-12-26 MED ORDER — ASPIRIN 81 MG PO TBEC: 81.0000 mg | DELAYED_RELEASE_TABLET | Freq: Every day | ORAL | Status: DC

## 2022-12-26 MED ORDER — CLOPIDOGREL BISULFATE 75 MG PO TABS: 75.0000 mg | ORAL_TABLET | Freq: Every day | ORAL | Status: AC

## 2022-12-26 MED ORDER — PANTOPRAZOLE SODIUM 40 MG PO TBEC
40.0000 mg | DELAYED_RELEASE_TABLET | Freq: Two times a day (BID) | ORAL | Status: DC
  Administered 2022-12-26: 40 mg via ORAL
  Filled 2022-12-26: qty 1

## 2022-12-26 MED ORDER — DOXYCYCLINE HYCLATE 100 MG PO TABS
100.0000 mg | ORAL_TABLET | Freq: Two times a day (BID) | ORAL | 0 refills | Status: AC
  Filled 2022-12-26: qty 9, 5d supply, fill #0

## 2022-12-26 NOTE — Assessment & Plan Note (Signed)
Diffuse tenderness, warmth, erythema to scrotum, worse with palpation of right testicle, no swelling. Daughter reports poor hygiene to that area since his hip surgery.  Could be secondary to epididymitis, cellulitis, or yeast. - Empirically treating for epididymitis with Rocephin and doxycycline as above - Scrotal ultrasound with Doppler pending - Can consider adding nystatin if more concerned for yeast infection

## 2022-12-26 NOTE — Progress Notes (Signed)
Physical Therapy Evaluation Patient Details Name: Paul Colon MRN: [id number] DOB: 22-Jan-1930 Today's Date: 12/26/2022  History of Present Illness  Pt is a 89 y.o. M who presents with generalized weakness. Differential for presentation includes infection and TIA. CT head negative for acute abnormality. 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 PMH includes: dementia, frequent falls, L femoral neck fx 01/2022, DM II, HLD, CKD, BPH, and anemia.  Clinical Impression  Pt received in the chair and agreeable to participate in physical therapy evaluation. Displays good strength in all extremities and is mobilizing fairly. Pt ambulating 150 ft with a walker at a CGA level; requires one seated rest break. Has good family support upon d/c. Recommend continued HHPT to address balance, strengthening, and functional mobility.         If plan is discharge home, recommend the following: A little help with walking and/or transfers;A little help with bathing/dressing/bathroom;Assistance with cooking/housework;Assist for transportation;Help with stairs or ramp for entrance   Can travel by private vehicle        Equipment Recommendations None recommended by PT  Recommendations for Other Services       Functional Status Assessment Patient has had a recent decline in their functional status and demonstrates the ability to make significant improvements in function in a reasonable and predictable amount of time.     Precautions / Restrictions Precautions Precautions: Fall Precaution Comments: HOH with R hearing aid Restrictions Weight Bearing Restrictions: No      Mobility  Bed Mobility               General bed mobility comments: OOB in chair    Transfers Overall transfer level: Needs assistance Equipment used: Rolling walker (2 wheels) Transfers: Sit to/from Stand Sit to Stand: Supervision           General transfer comment: Slow to  rise    Ambulation/Gait Ambulation/Gait assistance: Supervision Gait Distance (Feet): 150 Feet Assistive device: Rolling walker (2 wheels) Gait Pattern/deviations: Step-through pattern, Decreased stride length, Shuffle, Trunk flexed Gait velocity: decreased     General Gait Details: Verbal cueing for upright posture and walker positioning. One seated rest break  Stairs            Wheelchair Mobility     Tilt Bed    Modified Rankin (Stroke Patients Only)       Balance Overall balance assessment: Needs assistance   Sitting balance-Leahy Scale: Fair       Standing balance-Leahy Scale: Poor                               Pertinent Vitals/Pain Pain Assessment Pain Assessment: No/denies pain    Home Living Family/patient expects to be discharged to:: Private residence Living Arrangements: Children Available Help at Discharge: Family;Available 24 hours/day Type of Home: House Home Access: Stairs to enter Entrance Stairs-Rails: Can reach both Entrance Stairs-Number of Steps: 3 Alternate Level Stairs-Number of Steps: flight Home Layout: Two level;Other (Comment);1/2 bath on main level Home Equipment: Rollator (4 wheels);Cane - single point;Shower seat;Wheelchair - manual;BSC/3in1      Prior Function Prior Level of Function : Needs assist             Mobility Comments: Ambulates with CGA + RW, family support ADLs Comments: family helps him with ADLs and IADLs. Family helps with getting in shower     Extremity/Trunk Assessment   Upper Extremity Assessment  Upper Extremity Assessment: RUE deficits/detail;LUE deficits/detail RUE Deficits / Details: Grossly 5/5 LUE Deficits / Details: Grossly 5/5    Lower Extremity Assessment Lower Extremity Assessment: RLE deficits/detail;LLE deficits/detail RLE Deficits / Details: Grossly 5/5 LLE Deficits / Details: Grossly 5/5    Cervical / Trunk Assessment Cervical / Trunk Assessment: Kyphotic   Communication   Communication Communication: Hearing impairment  Cognition Arousal: Alert Behavior During Therapy: WFL for tasks assessed/performed Overall Cognitive Status: History of cognitive impairments - at baseline                                 General Comments: Pt A&O to self, place and month, not year or situation        General Comments      Exercises     Assessment/Plan    PT Assessment Patient needs continued PT services  PT Problem List Decreased balance;Decreased strength;Decreased range of motion;Decreased activity tolerance;Decreased mobility;Decreased safety awareness       PT Treatment Interventions DME instruction;Gait training;Stair training;Functional mobility training;Therapeutic activities;Therapeutic exercise;Balance training    PT Goals (Current goals can be found in the Care Plan section)  Acute Rehab PT Goals Patient Stated Goal: Per daughter, return home with family assist PT Goal Formulation: With patient/family Time For Goal Achievement: 01/09/23 Potential to Achieve Goals: Good    Frequency Min 1X/week     Co-evaluation               AM-PAC PT "6 Clicks" Mobility  Outcome Measure Help needed turning from your back to your side while in a flat bed without using bedrails?: None Help needed moving from lying on your back to sitting on the side of a flat bed without using bedrails?: A Little Help needed moving to and from a bed to a chair (including a wheelchair)?: A Little Help needed standing up from a chair using your arms (e.g., wheelchair or bedside chair)?: A Little Help needed to walk in hospital room?: A Little Help needed climbing 3-5 steps with a railing? : A Little 6 Click Score: 19    End of Session Equipment Utilized During Treatment: Gait belt Activity Tolerance: Patient tolerated treatment well Patient left: in chair;with call bell/phone within reach;with family/visitor present Nurse Communication:  Mobility status PT Visit Diagnosis: Unsteadiness on feet (R26.81);Difficulty in walking, not elsewhere classified (R26.2)    Time: 1030-1058 PT Time Calculation (min) (ACUTE ONLY): 28 min   Charges:   PT Evaluation $PT Eval Low Complexity: 1 Low PT Treatments $Gait Training: 8-22 mins PT General Charges $$ ACUTE PT VISIT: 1 Visit         Lillia Pauls, PT, DPT Acute Rehabilitation Services Office [phone]   Norval Morton 12/26/2022, 1:45 PM

## 2022-12-26 NOTE — Assessment & Plan Note (Signed)
Holding home metformin 1000 mg twice daily.  A1c 8.4 two weeks ago. - Moderate SSI - CBG before meals and at bedtime

## 2022-12-26 NOTE — Evaluation (Signed)
Occupational Therapy Evaluation Patient Details Name: Paul Colon MRN: [id number] DOB: Sep 14, 1929 Today's Date: 12/26/2022   History of Present Illness Pt is a 89 y.o. M who presents with generalized weakness. Differential for presentation includes infection and TIA. CT head negative for acute abnormality. 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 PMH includes: dementia, frequent falls, L femoral neck fx 01/2022, DM II, HLD, CKD, BPH, and anemia.   Clinical Impression   Pt currently at min guard assist level for toilet transfers, grooming at the sink, and simulated selfcare tasks sit to stand.  Pt lives with his daughter and son-in-law and they provide supervision/assist as needed.  Prior to admission pt was recovering from ORIF right hip and receiving HHOT.  Recommend continued HHOT with pt having expected discharge later today.  Will defer continued OT needs to them.         If plan is discharge home, recommend the following: A little help with walking and/or transfers;A little help with bathing/dressing/bathroom;Direct supervision/assist for medications management;Assistance with cooking/housework;Direct supervision/assist for financial management;Help with stairs or ramp for entrance;Assist for transportation;Supervision due to cognitive status    Functional Status Assessment  Patient has had a recent decline in their functional status and demonstrates the ability to make significant improvements in function in a reasonable and predictable amount of time.  Equipment Recommendations  Other (comment) (None recommended at this time but discussed tub bench in the future that can be purchased if needed.)       Precautions / Restrictions Precautions Precautions: Fall Precaution Comments: HOH with R hearing aid Restrictions Weight Bearing Restrictions: No RLE Weight Bearing: Weight bearing as tolerated      Mobility Bed Mobility Overal  bed mobility: Needs Assistance Bed Mobility: Sit to Supine       Sit to supine: Supervision   General bed mobility comments: Increased time for lifting LEs into the bed but able to complete without assist from therapist.    Transfers Overall transfer level: Needs assistance Equipment used: Rolling walker (2 wheels) Transfers: Sit to/from Stand Sit to Stand: Contact guard assist Stand pivot transfers: Contact guard assist         General transfer comment: Min instructional cueing for hand placement and for staying inside of the walker and closer to it during mobility.      Balance Overall balance assessment: Needs assistance Sitting-balance support: No upper extremity supported, Feet supported Sitting balance-Leahy Scale: Fair     Standing balance support: Reliant on assistive device for balance, Bilateral upper extremity supported Standing balance-Leahy Scale: Poor Standing balance comment: Pt needs UE support on RW during mobility.                           ADL either performed or assessed with clinical judgement   ADL Overall ADL's : Needs assistance/impaired Eating/Feeding: Modified independent;Sitting   Grooming: Wash/dry hands;Contact guard assist;Standing   Upper Body Bathing: Set up;Sitting Upper Body Bathing Details (indicate cue type and reason): simulated Lower Body Bathing: Contact guard assist;Sit to/from stand Lower Body Bathing Details (indicate cue type and reason): simulated Upper Body Dressing : Minimal assistance;Sitting Upper Body Dressing Details (indicate cue type and reason): hospital gown Lower Body Dressing: Contact guard assist;Sit to/from stand   Toilet Transfer: Contact guard assist;Comfort height toilet;Rolling walker (2 wheels);Grab bars   Toileting- Clothing Manipulation and Hygiene: Minimal assistance;Sit to/from stand Toileting - Clothing Manipulation Details (indicate cue type  and reason): assist with keeping gown out of  the water     Functional mobility during ADLs: Contact guard assist;Rolling walker (2 wheels) General ADL Comments: Pt's daughter present and provided most of PLOF secondary to patient being sleepy.  Once aroused and stimulated, he remained awake and participated with therapy session.  Pt has a shower seat for home but discussed with pt and daughter benefit of tub bench if stepping over the tub becomes too difficult.  She voiced understanding and confirms that she or her spouse are always with him when he showers.  Recommend continued HHOT at discharge to progress back to supervision/modified independent level.     Vision Baseline Vision/History: 1 Wears glasses (recent cataract surgery and doesn't know new perscription yet.  Usually wears glasses but secondary to ear drainage and  splint over the ear, he has not been able to.) Ability to See in Adequate Light: 0 Adequate Patient Visual Report: No change from baseline Vision Assessment?: No apparent visual deficits     Perception Perception: Within Functional Limits       Praxis Praxis: WFL       Pertinent Vitals/Pain Pain Assessment Pain Assessment: Faces Pain Score: 0-No pain     Extremity/Trunk Assessment Upper Extremity Assessment Upper Extremity Assessment: Overall WFL for tasks assessed RUE Deficits / Details: Grossly 5/5 LUE Deficits / Details: Grossly 5/5   Lower Extremity Assessment Lower Extremity Assessment: Defer to PT evaluation RLE Deficits / Details: Grossly 5/5 LLE Deficits / Details: Grossly 5/5   Cervical / Trunk Assessment Cervical / Trunk Assessment: Kyphotic   Communication Communication Communication: Hearing impairment Cueing Techniques: Verbal cues   Cognition Arousal: Alert Behavior During Therapy: WFL for tasks assessed/performed Overall Cognitive Status: History of cognitive impairments - at baseline                                 General Comments: Pt oriented to place and   able to state he was sick and not feeling well.  Mild dementia reported which he was just recently diagnosed with per his daughter.                Home Living Family/patient expects to be discharged to:: Private residence Living Arrangements: Children Available Help at Discharge: Family;Available 24 hours/day Type of Home: House Home Access: Stairs to enter [employer] of Steps: 3 Entrance Stairs-Rails: Can reach both Home Layout: Two level;Other (Comment);1/2 bath on main level Alternate Level Stairs-Number of Steps: flight Alternate Level Stairs-Rails: Left Bathroom Shower/Tub: Chief Strategy Officer: Standard     Home Equipment: Rollator (4 wheels);Cane - single point;Shower seat;Wheelchair - manual;BSC/3in1          Prior Functioning/Environment Prior Level of Function : Needs assist             Mobility Comments: Ambulates with CGA + RW, family support ADLs Comments: family helps him with ADLs and IADLs. Family helps with getting in shower        OT Problem List: Decreased strength;Impaired balance (sitting and/or standing);Decreased cognition;Decreased knowledge of use of DME or AE      OT Treatment/Interventions:      OT Goals(Current goals can be found in the care plan section) Acute Rehab OT Goals Time For Goal Achievement: 12/22/22  OT Frequency:         AM-PAC OT "6 Clicks" Daily Activity     Outcome Measure Help  from another person eating meals?: None Help from another person taking care of personal grooming?: A Little Help from another person toileting, which includes using toliet, bedpan, or urinal?: A Little Help from another person bathing (including washing, rinsing, drying)?: A Little Help from another person to put on and taking off regular upper body clothing?: A Little Help from another person to put on and taking off regular lower body clothing?: A Little 6 Click Score: 19   End of Session Equipment Utilized  During Treatment: Rolling walker (2 wheels) Nurse Communication: Mobility status  Activity Tolerance: Patient tolerated treatment well Patient left: in bed;with call bell/phone within reach;with chair alarm set  OT Visit Diagnosis: Unsteadiness on feet (R26.81);Other abnormalities of gait and mobility (R26.89);History of falling (Z91.81);Muscle weakness (generalized) (M62.81);Repeated falls (R29.6)                Time: 1610-9604 OT Time Calculation (min): 33 min Charges:  OT General Charges $OT Visit: 1 Visit OT Evaluation $OT Eval Moderate Complexity: 1 Mod OT Treatments $Self Care/Home Management : 8-22 mins  Perrin Maltese, OTR/L Acute Rehabilitation Services  Office [phone] 12/26/2022

## 2022-12-26 NOTE — Discharge Instructions (Addendum)
Dear [name],   Thank you for letting us participate in your care! You were admitted due to weakness and a fever. We suspect this was caused by a transient ischemic attack and an infection of the scrotum. We recommend restarting Plavix and finishing a course of antibiotics as well as starting an iron supplement.  Please return to the hospital if you have further weakness, confusion, or significant bleeding.   POST-HOSPITAL & CARE INSTRUCTIONS We recommend following up with your PCP within 1 week from being discharged from the hospital. Please let PCP/Specialists know of any changes in medications that were made which you will be able to see in the medications section of this packet. Please also follow up with ENT for your ear hematoma.  DOCTOR'S APPOINTMENTS & FOLLOW UP Future Appointments  Date Time Provider Department Center  01/04/2023  1:00 PM [address] [facility] None     Thank you for choosing [facility]! Take care and be well!  Family Medicine Teaching Service Inpatient Team [place]  [facility]  [address] [place], [place] [address] [phone]

## 2022-12-26 NOTE — Assessment & Plan Note (Signed)
Recurrent left ear hematoma managed by ENT, Dr. Allena Katz. Thought to be related to previous fall. Appears stable, no warmth over the ear. (10/20 ED drained and put on PO ciprofloxacin, 10/22 initial ENT visit, 10/25 re-drained in ED, 11/8 drained again by ENT -topical mupirocin + bolster placed for 2 weeks, 11/10-re-drained by ED and put on keflex told to stop plavix, 11/12 ENT did not believe infected but okay to continue PO Abx) - Continue glasscock to left ear - ENT advised monitoring instead of additional drainage - Hold home keflex and ciprofloxacin

## 2022-12-26 NOTE — Assessment & Plan Note (Signed)
Hemoglobin 8.5, appears his baseline is around 11.  Suspect his anemia is in the setting of his recurrent ear hematoma in addition to right thigh hematoma from recent right hip arthroplasty.  Differentials include GI bleed, no blood in stool reported, daughter states the patient has never had a colonoscopy.  - POC occult blood from ED pending - Iron panel - AM CBC

## 2022-12-26 NOTE — Assessment & Plan Note (Deleted)
Hemoglobin 8.5, appears his baseline is around 11.  Suspect his anemia is in the setting of his recurrent ear hematoma in addition to right thigh hematoma from recent right hip arthroplasty- looks worse today?***  Possible GI bleed given recently started DAPT + DVT ppx with ASA 81 BID and Plavix 75 daily (stopped plavix 11/10 due to ear hematoma). No gross blood in stool reported, daughter states the patient has never had a colonoscopy. No known CAD or hx of MI per daughter.   Iron studies indicate anemia of chronic disease. AM Hgb 7.7. R thigh hematoma appears stable, mildly tender to palpation. Scant blood in diaper, appears to be from penis.  -- ASA 81mg  once daily --continue plavix  - POC occult blood from ED pending - Iron panel - SCDs for now given anemia (is at high risk for DVT d/t recent hip fracture-recommend restarting as soon as hemoglobin stable) - AM CBC

## 2022-12-26 NOTE — Assessment & Plan Note (Deleted)
Transient weakness lasting minutes on day of admission with fever to 101.2.  Suspect this could be due to his infectious etiology or anemia. TIA possible given right sided involvement which resolved rapidly (ABCD2 score of 4 - moderate risk), did have recent CVA .  Fever suspected to be from possible scrotal infection (see below). No evidence of respiratory infection. Low suspicion for UTI given no urinary symptoms with only moderate leukocytes on UA, urine culture pending.  Afebrile overnight with slight increase in leukocytosis to 13.9.  -Tylenol 650 mg every 6 hours as needed for mild pain or fever - Zofran 4 mg every 6 hours as needed for nausea - ABX as below - Consider MRI, Echo, TSH, Lipid Panel for TIA workup (recently performed on admission in October) - Vital signs per floor - PT/OT eval - AM BMP, CBC

## 2022-12-26 NOTE — TOC CM/SW Note (Addendum)
[facility].   [facility] PCP Wynona Neat NP, [facility] [place] [address] [phone] ext [phone]  Per chart review , patient recently discharged with [facility] . Spoke to [employer] with [facility] patient is active with them for PT,OT,SP .   PT / OT evaluations ordered . Await recommendations

## 2022-12-26 NOTE — TOC Initial Note (Signed)
Transition of Care (TOC) - Initial/Assessment Note   Spoke to patient and daughter [name] with OT present at bedside.   Patient from home with daughter has all needed DME. Patient active with [facility] and wants to continue services with [facility]  Secure chatted team for resumption of care orders for PT/OT/SP.  Tresa Endo and Merry Proud at [facility] aware of discharge today  Patient Details  Name: Paul Colon MRN: [id number] Date of Birth: [birth year]  Transition [facility]) CM/SW Contact:    Kingsley Plan, RN Phone Number: 12/26/2022, 2:22 PM  Clinical Narrative:                   Expected Discharge Plan: Home w Home Health Services Barriers to Discharge: No Barriers Identified   Patient Goals and CMS Choice   CMS Medicare.gov Compare Post Acute Care list provided to:: Patient Choice offered to / list presented to : Patient      Expected Discharge Plan and Services   Discharge Planning Services: CM Consult Post Acute Care Choice: Home Health Living arrangements for the past 2 months: Single Family Home Expected Discharge Date: 12/26/22               DME Arranged: N/A DME Agency: NA       HH Arranged: PT, OT, Speech Therapy HH Agency: [facility] Date HH Agency Contacted: 12/26/22 Time HH Agency Contacted: 1421 Representative spoke with at [facility] Agency: Tresa Endo and Merry Proud  Prior Living Arrangements/Services Living arrangements for the past 2 months: Single Family Home Lives with:: Adult Children   Do you feel safe going back to the place where you live?: Yes          Current home services: DME    Activities of Daily Living   ADL Screening (condition at time of admission) Independently performs ADLs?: No Does the patient have a NEW difficulty with bathing/dressing/toileting/self-feeding that is expected to last >3 days?: No Does the patient have a NEW difficulty with getting in/out of bed, walking, or climbing stairs that is expected to last >3  days?: No Does the patient have a NEW difficulty with communication that is expected to last >3 days?: No Is the patient deaf or have difficulty hearing?: Yes Does the patient have difficulty seeing, even when wearing glasses/contacts?: No Does the patient have difficulty concentrating, remembering, or making decisions?: Yes  Permission Sought/Granted   Permission granted to share information with : Yes, Verbal Permission Granted  Share Information with NAME: daughter [name]           Emotional Assessment              Admission diagnosis:  Lower urinary tract infectious disease [N39.0] Weakness [R53.1] Patient Active Problem List   Diagnosis Date Noted   Weakness 12/25/2022   Scrotal pain 12/25/2022   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/2022   Acute cerebral infarction (HCC) 12/10/2022   Cerebrovascular accident (CVA) (HCC) 12/10/2022   Type 2 diabetes mellitus (HCC) 12/08/2022   Hip fracture (HCC) 12/05/2022   Ear hematoma, left 12/05/2022   Cognitive decline 12/05/2022   Chest x-ray abnormality 12/05/2022   HTN (hypertension) 10/05/2022   History of cataract surgery 10/05/2022   S/P total left hip arthroplasty 01/21/2022   Fracture of femoral neck, left, closed (HCC) 01/20/2022   Fall at home, initial encounter 01/20/2022   AKI (acute kidney injury) (HCC) 01/20/2022   BPH (benign prostatic hyperplasia) 01/20/2022   Hyperlipidemia 01/20/2022   Hyponatremia 09/06/2021  Generalized weakness 09/06/2021   Uncontrolled type 2 diabetes mellitus with hyperglycemia, without long-term current use of insulin (HCC) 09/06/2021   Chronic kidney disease, stage 3a (HCC) 09/06/2021   Anemia 09/06/2021   PCP:  [facility], [facility] Pharmacy:   [id number] - [place], [place] - [address] [place] [address] Phone: [phone] Fax: [phone]     Social Determinants of Health (SDOH) Social History: SDOH Screenings   Food Insecurity: No  Food Insecurity (12/25/2022)  Housing: Low Risk  (12/25/2022)  Transportation Needs: No Transportation Needs (12/25/2022)  Utilities: Not At Risk (12/25/2022)  Social Connections: Unknown (09/26/2021)   Received from [facility]  Tobacco Use: Low Risk  (12/25/2022)   SDOH Interventions:     Readmission Risk Interventions     No data to display

## 2022-12-26 NOTE — Assessment & Plan Note (Signed)
Transient weakness lasting minutes today.  Suspect this could be due to his infectious etiology or anemia. TIA possible given right sided involvement which resolved rapidly (ABCD2 score of 4 - moderate risk), did have recent CVA .  Fever suspected to be from possible scrotal infection.  Mild leukocytosis at 11.3.  Anemia likely in the setting of recurrent ear hematoma, or thigh hematoma from recent hip surgery.  Respiratory viral panel unremarkable.  CXR unremarkable.  Low suspicion for UTI given no urinary symptoms, just moderate leukocytes on UA.  Culture pending.   - admitted to [facility], med surg, Dr. Miquel Dunn attending -Tylenol 650 mg every 6 hours as needed for mild pain or fever - Zofran 4 mg every 6 hours as needed for nausea - Rocephin 1 g daily, doxycycline 100 mg every 12 hours empirically for epididymitis - Consider MRI, Echo, TSH, Lipid Panel for TIA workup (recently performed on admission in October) - Vital signs per floor - PT/OT eval - AM BMP, CBC

## 2022-12-26 NOTE — Discharge Summary (Cosign Needed Addendum)
Family Medicine Teaching [facility] Discharge Summary  Patient name: Paul Colon Medical record number: [id number] Date of birth: 1929/05/03 Age: 89 y.o. Gender: male Date of Admission: 12/25/2022  Date of Discharge: 12/26/22 Admitting Physician: Para March, DO  Primary Care Provider: [facility], [facility] Consultants: none  Indication for Hospitalization: weakness  Discharge Diagnoses/Problem List:  Principal Problem for Admission: weakness Other Problems addressed during stay:  Principal Problem:   Weakness Active Problems:   Anemia   Ear hematoma, left   Type 2 diabetes mellitus (HCC)   Scrotal pain    Brief Hospital Course:  Paul Colon is a 89 y.o.male with a history of T2DM, HLD, cognitive decline/dementia, hyponatremia due to SIADH, recent R hip fracture, recent CVA who was admitted to the [facility] Medicine Teaching Service at [facility] for weakness. His hospital course is detailed below:  Weakness  Reported R-sided weakness at home per family. Had recently stopped taking Plavix (was started on this due to CVA during prior hospitalization) due to a worsening ear hematoma. CT head negative for acute bleed, further stroke workup was deferred this admission as this was recently completed. Pt was febrile to 101.2 on admission, possible that an infectious process was contributing to his weakness. Workup was negative for respiratory infection. Questionable UTI with moderate leukocytes on UA. Possible that pt had a TIA after stopping Plavix.   Scrotal pain Tenderness, warmth, and erythema on admission plus fever as above. Treated empirically for epididymitis with ceftriaxone and doxycycline. US scrotum with doppler negative. Remained afebrile following initiation of antibiotics. Discharged with doxycycline to complete 5 day course.  Anemia Hgb 8.5 on admission. Etiology appears to be multifactorial, due to recent surgery with hip hematoma, ear hematoma from prior  trauma, and ASA/Plavix therapy possibly causing GI bleed. No signs of overt bleeding during hospitalization and Hbg remained stable. After shared decision making with daughter, choice was made to continue Plavix and ASA as indicated following stroke during prior admission.   Other chronic conditions were medically managed with home medications and formulary alternatives as necessary  PCP Follow-up Recommendations: Ensure completion of Plavix (ending 11/26) then ASA 81mg  daily alone F/u neurology for prior stroke, possible TIA Ensure ENT follow up for ear hematoma Monitor R hip surgical site hematoma   Disposition: home  Discharge Condition: stable  Discharge Exam:  Vitals:   12/26/22 0540 12/26/22 0734  BP: (!) 121/52 136/65  Pulse: 77 67  Resp: 18 16  Temp: 97.9 F (36.6 C) 98.1 F (36.7 C)  SpO2: 100% 100%   General: sitting up in bed, hard of hearing, in NAD HEENT: L ear with packing in place, appears stable compared to photo from admission Cardiovascular: RRR, normal S1/S2 Respiratory: CTAB to anterior lung fields, normal WOB on RA Abdomen: normoactive bowel sounds, soft, nontender, nondistended MSK: stable hematoma at R hip surgical site with mupirocin ointment (see photo in media tab), moderately tender to palpation, incision appears clean, dry, intact Neuro: awake and alert, responding appropriately to questions, moving all extremities spontaneously GU: (RN and pt daughter present in room) diffuse erythema and tenderness to palpation of scrotum. Few drops of dried blood on diaper, appears to be from where penis was covered  Significant Procedures: none  Significant Labs and Imaging:  Recent Labs  Lab 12/25/22 1525 12/26/22 0526 12/26/22 1118  WBC 11.3* 13.9* 11.9*  HGB 8.5* 7.7* 8.0*  HCT 26.8* 23.3* 25.2*  PLT 479* 368 409*   Recent Labs  Lab 12/25/22 1525 12/26/22 0526  NA 132* 130*  K 4.5 4.4  CL 97* 97*  CO2 24 25  GLUCOSE 361* 253*  BUN 24* 18   CREATININE 1.18 1.04  CALCIUM 9.2 8.5*  ALKPHOS 118  --   AST 19  --   ALT 15  --   ALBUMIN 3.1*  --     CT head 1. No evidence of acute intracranial abnormality. 2. Soft tissue thickening and or edema involving the left ear, partially imaged. Correlate with direct inspection.  Results/Tests Pending at Time of Discharge: none  Discharge Medications:  Allergies as of 12/26/2022       Reactions   Alogliptin Other (See Comments)   Reaction type/severity unknown   Penicillin G Rash        Medication List     STOP taking these medications    cephALEXin 500 MG capsule Commonly known as: KEFLEX       TAKE these medications    acetaminophen 325 MG tablet Commonly known as: TYLENOL Take 2 tablets (650 mg total) by mouth every 6 (six) hours.   aspirin EC 81 MG tablet Take 1 tablet (81 mg total) by mouth daily.   atorvastatin 20 MG tablet Commonly known as: LIPITOR Take 1 tablet (20 mg total) by mouth every evening.   clopidogrel 75 MG tablet Commonly known as: Plavix Take 1 tablet (75 mg total) by mouth daily for 13 days.   Difluprednate 0.05 % Emul Place 1 drop into the right eye daily.   doxycycline 100 MG tablet Commonly known as: VIBRA-TABS Take 1 tablet (100 mg total) by mouth every 12 (twelve) hours for 4 days.   finasteride 5 MG tablet Commonly known as: PROSCAR Take 5 mg by mouth every evening. For enlarged prostate   metFORMIN 1000 MG tablet Commonly known as: GLUCOPHAGE Take 1,000 mg by mouth 2 (two) times daily with a meal.   mupirocin ointment 2 % Commonly known as: BACTROBAN Apply 1 Application topically 2 (two) times daily. Over the yellow gauze twice per day   ONE-A-DAY MENS 50+ PO Take 1 tablet by mouth in the morning.   Polyvinyl Alcohol-Povidone PF 1.4-0.6 % Soln Apply 1 drop to eye daily.   sodium chloride 5 % ophthalmic solution Commonly known as: MURO 128 Place 1 drop into the right eye at bedtime.        Discharge  Instructions: Please refer to Patient Instructions section of EMR for full details.  Patient was counseled important signs and symptoms that should prompt return to medical care, changes in medications, dietary instructions, activity restrictions, and follow up appointments.   Follow-Up Appointments:  Follow-up Information     [facility] Follow up.   Specialty: [facility] Contact information: [address] [place] [address] [place] [address] [phone]                 Erick Alley, DO 12/26/2022, 3:27 PM PGY-1, [facility] Health Family Medicine

## 2022-12-26 NOTE — Assessment & Plan Note (Deleted)
Diffuse tenderness, warmth, erythema to scrotum, worse with palpation of right testicle, no swelling. Daughter reports poor hygiene to that area since his hip surgery.  Could be secondary to epididymitis, cellulitis, or yeast. Scrotal US without acute findings. - Empirically treating for epididymitis with Rocephin and doxycycline as above - Can consider adding nystatin if more concerned for yeast infection

## 2022-12-26 NOTE — Assessment & Plan Note (Deleted)
Recurrent left ear hematoma managed by ENT, Dr. Allena Katz. Thought to be related to previous fall. Appears stable, no warmth over the ear.  - Continue glasscock to left ear - ENT advised monitoring instead of additional drainage on admission - Hold home keflex and ciprofloxacin

## 2022-12-26 NOTE — Assessment & Plan Note (Deleted)
Holding home metformin 1000 mg twice daily.  A1c 8.4 two weeks ago. - Moderate SSI - CBG before meals and at bedtime

## 2022-12-27 LAB — URINE CULTURE: Culture: 20000 — AB

## 2023-01-03 ENCOUNTER — Telehealth (INDEPENDENT_AMBULATORY_CARE_PROVIDER_SITE_OTHER): Payer: Self-pay | Admitting: Otolaryngology

## 2023-01-03 NOTE — Telephone Encounter (Signed)
Patient's daughter, Dia Crawford, called and stated that the sutures have come out and she is not sure if they still need to come in for his appt on Friday, 11/22 @ 1pm.  She would like a call back at [phone].

## 2023-01-04 ENCOUNTER — Ambulatory Visit (INDEPENDENT_AMBULATORY_CARE_PROVIDER_SITE_OTHER): Payer: No Typology Code available for payment source | Admitting: Otolaryngology

## 2023-01-04 ENCOUNTER — Encounter (INDEPENDENT_AMBULATORY_CARE_PROVIDER_SITE_OTHER): Payer: Self-pay

## 2023-01-04 VITALS — Ht 71.0 in | Wt 145.0 lb

## 2023-01-04 DIAGNOSIS — S00432A Contusion of left ear, initial encounter: Secondary | ICD-10-CM

## 2023-01-04 DIAGNOSIS — S00432D Contusion of left ear, subsequent encounter: Secondary | ICD-10-CM | POA: Diagnosis not present

## 2023-01-04 DIAGNOSIS — Z9181 History of falling: Secondary | ICD-10-CM

## 2023-01-12 NOTE — Progress Notes (Signed)
Otolaryngology Clinic Note Referring provider: ED HPI:  Jentry Slayton is a 89 y.o. male with dementia seen in urgent follow up for evaluation of auricular hematoma.  Initial visit 12/04/2022: Patient was seen in ED on 12/02/2022 for auricular hematoma on left, presumed due to fall. It was drained by ED but bolster was not placed. It has since reaccumulated and he presents today. He has dementia but does not appear to be in any pain. There is surrounding erythema and cellulitis with mild tenderness only to palpation. Mild postauricular fullness. He has longstanding HL and wears hearing aids but no other significant otologic history He is on PO cipfloxacin  Pt and daughter were advised to go to the ED because I was unable to place a bolster due to lack of supplies.  However they did not go to the ED and present today  12/05/2022: Daughter reports that she did not take him to the ED due to concern for long wait times.  No significant change in swelling or status changes, no ear drainage, no significant ear pain or discomfort.  He has not had an auricular hematoma prior to this episode and denies prior significant ear problems.  He has not been wearing his hearing aid on the left but he has been wearing his hearing aid on the right  He was then admitted to the hospital after a fall, and hematoma was noted to have reaccumulated. It was re-drained on 10/25. He has now been discharged and presented for follow up on 11/4, where bolster was removed without evidence of hematoma rea-accumulation.  12/21/2022 Daughter called for urgent appointment, reporting that hematoma had re-accumulated Daughter brings him. No ear pain, drainage. She reports that ear looked great until about Wednesday or Thursday, when she noticed swelling again. He had been laying on it. Continues ASA 81  Hematoma was re-drained and then he had bleeding around the bolster and some re-accumulation, re-drained by Dr. Elijah Birk on  12/23/2022  12/25/2022: Patient returns for follow up given re-drainage. He is feeling quite weak per daughter, and has a new onset right hand tremor. There is a small hematoma formation around the bolster. No fevers, discomfort, redness. They have been doing their best to avoid traumatizing the area. He is on antibiotics and ointment. The pressure dressing has been removed.  01/04/2023: Returns for follow up. Daughter brings him. She reports that the bolster fell off few days ago, and ear swelling has slowly decreased in size. Doing better from mental status standpoint. No fevers, chills, drainage from ear. No other issues.  PMHx: dementia, on ASA 60 - denies CAD or Heart problems Denies H&N surgery in past  Independent Review of Additional Tests or Records:  ED notes reviewed Head CT 08/24/2022: mastoids well aerated, ossicles and auricle unremarkable In patient notes reviewed CT Head 12/05/22: mastoids and ME well aerated; bolster in place,some gas but no auricular hematoma reaccumulation noted then   Prior notes reviewed as well  PMH/Meds/All/SocHx/FamHx/ROS:   Past Medical History:  Diagnosis Date   Diabetes mellitus without complication (HCC)    Kidney disease    Severe dementia (HCC) 12/05/2022     Past Surgical History:  Procedure Laterality Date   ANTERIOR APPROACH HEMI HIP ARTHROPLASTY Right 12/07/2022   Procedure: ANTERIOR APPROACH HEMI HIP ARTHROPLASTY;  Surgeon: Samson Frederic, MD;  Location: [facility] OR;  Service: Orthopedics;  Laterality: Right;   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   Procedure: TOTAL HIP ARTHROPLASTY ANTERIOR APPROACH;  Surgeon: Durene Romans, MD;  Location: [facility] OR;  Service: Orthopedics;  Laterality: Left;    History reviewed. No pertinent family history.   Social Connections: Unknown (09/26/2021)   Received from [facility]   Social Network    Social Network: Not on file     Current Outpatient Medications:    acetaminophen  (TYLENOL) 325 MG tablet, Take 2 tablets (650 mg total) by mouth every 6 (six) hours., Disp: , Rfl:    aspirin EC 81 MG tablet, Take 1 tablet (81 mg total) by mouth daily., Disp: , Rfl:    atorvastatin (LIPITOR) 20 MG tablet, Take 1 tablet (20 mg total) by mouth every evening., Disp: 30 tablet, Rfl: 0   Difluprednate 0.05 % EMUL, Place 1 drop into the right eye daily., Disp: , Rfl:    ferrous sulfate 325 (65 FE) MG tablet, Take 1 tablet (324 mg total) by mouth every other day., Disp: 30 tablet, Rfl: 0   finasteride (PROSCAR) 5 MG tablet, Take 5 mg by mouth every evening. For enlarged prostate, Disp: , Rfl:    metFORMIN (GLUCOPHAGE) 1000 MG tablet, Take 1,000 mg by mouth 2 (two) times daily with a meal., Disp: , Rfl:    Multiple Vitamins-Minerals (ONE-A-DAY MENS 50+ PO), Take 1 tablet by mouth in the morning., Disp: , Rfl:    mupirocin ointment (BACTROBAN) 2 %, Apply 1 Application topically 2 (two) times daily. Over the yellow gauze twice per day, Disp: 22 g, Rfl: 0   Polyvinyl Alcohol-Povidone PF 1.4-0.6 % SOLN, Apply 1 drop to eye daily., Disp: , Rfl:    sodium chloride (MURO 128) 5 % ophthalmic solution, Place 1 drop into the right eye at bedtime., Disp: , Rfl:    Physical Exam:   Ht 5\' 11"  (1.803 m)   Wt 145 lb (65.8 kg)   BMI 20.22 kg/m    Salient findings:  CN II-XII intact Right EAC some cerumen, ME well aerated, TM intact Left auricle without hematoma, mild firmness likely due to scarring. Incisions prior well healed. No surrounding erythema, not tender. AS: ME well aerated Right auricle without hematoma. No lesions of oral cavity/oropharynx No obviously palpable neck masses/lymphadenopathy/thyromegaly No respiratory distress or stridor Appropriately responds to question  Procedures:  None today  Impression & Plans:  Silas Holsapple is a 89 y.o. male with left auricular hematoma presenting for follow up  Left auricular hematoma - s/p multiple re-drainages on AP - Decided to not  re-drain last time given multiple reaccumulations, now healed with minimal diffuse scarring/firmness underneath - No evidence of infection - f/u PRN   MDM:  Level 3: 99213 Complexity/Problems addressed: low Data complexity: low - Morbidity: low

## 2023-02-08 NOTE — Progress Notes (Incomplete)
Assessment/Plan:     Paul Colon is a very pleasant 89 y.o. year old RH male with a history of hypertension, DM2, CKD, hyperlipidemia, essential tremor , HOH with recent auricular hematoma, symptomatic anemia, recent hospitalization after fall with R hip fracture, prior symptomatic OH,  seen today for evaluation of memory loss. MoCA today is .  Workup is in progress.  Jaffe*** Dementia, advanced, concern  for Alzheimer's disease    Recommend good control of cardiovascular risk factors. Continue baby ASA and Plavix, then Baby ASA alone    Continue to control mood as per PCP No indication for antidmementia medication given advanced age, and risks of medications outweighting the benefits of it.  Stroke, incidental finding,R temporal horn small infarct, likely due to SVD, in the setting recent surgery ( s/p hemiarthroplasty) MRI brain showed a punctate R temporal horn periventricular small infarct secondary to SVD source, 70  % narrowing of the R subclavian artery.  It was felt that his confusion was likely metabolic with AKI, hyponatremia, cipro effects, and possible delirium from dementia.  Prior to admission he was on baby ASA alone, then placed on baby and Plavix for 3 weeks, then back to baby ASA alone.  Continue present therapy Continue secondary stroke prevention with Lipitor 20 mg daily (no high intensity due to LDL at goal and advanced age), antihypertensives   Subjective:    The patient is accompanied by his daughter ***  who supplements the history.    How long did patient have memory difficulties?  For about.  Patient reports some difficulty remembering new information, recent conversations, names. repeats oneself?  Endorsed Disoriented when walking into a room?  Patient denies ***  Leaving objects in unusual places?  denies   Wandering behavior? denies   Any personality changes, or depression, anxiety? denies*** Hallucinations or paranoia? denies   Seizures? denies    Any  sleep changes?  Sleeps well ***/does not sleep well. ***vivid dreams, REM behavior or sleepwalking   Sleep apnea? Denies.   Any hygiene concerns?  Denies.   Independent of bathing and dressing? Endorsed  Does the patient need help with medications?  is in charge *** Who is in charge of the finances?  is in charge   *** Any changes in appetite?   Denies. ***   Patient have trouble swallowing?  Denies.   Does the patient cook? No*** Any headaches?  Denies.   Chronic pain? Denies.   Ambulates with difficulty? Needs a cane*** Needs a walker *** to ambulate for stability.   Recent falls or head injuries? Oct [address] had unwitnessed fall with fracture , followed by Ortho.   Vision changes?  Denies any new issues.  Has a history of*** Any strokelike symptoms? He had recent stroke workup in hospital  (Dr. Roda Shutters 12/10/22) due to increased confusion. Incidentally, MRI brain showed a punctate R temporal horn periventricular small infarct secondary to SVD source, 70  % narrowing of the R subclavian artery.  It was felt that his confusion was likely metabolic with AKI, hyponatremia, cipro effects, and possible delirium from dementia.  Prior to admission he was on baby ASA alone, then placed on baby and Plavix for 3 weeks, then back to baby ASA alone.   Any tremors? History of ET, postural tremor, intention tremor. *** Any anosmia? Denies.   Any incontinence of urine? Endorsed, recent UTI 12/2022.   Any bowel dysfunction? Denies.      Patient lives with ***  History of heavy  alcohol intake? Denies.   History of heavy tobacco use? Denies.   Family history of dementia?   *** with dementia  Does patient drive? No ***  MRI of the brain on 10/24/2021   demonstrated mild chronic small vessel ischemic changes as well as generalized atrophy with asymmetric volume loss of the right hippocampus and possible disproportionate midbrain atrophy but no evidence of stroke   CT head personally reviewed 12/2022  L ear edema,  no acute findings. Cerebral atrophy noted.   MRI brain 11/2022 personally reviewed   Punctate focus of restricted diffusion in the right temporal lobe medially subjacent to the lateral ventricle compatible with a small acute infarct.  CTA head and neck - 70% narrowing of the proximal right subclavian artery  2D Echo EF 55-60%  Most recent labs Nov 12/24 H/H 8.5/26.8. WBC 11.3, plt 479, A1C 8.4, LDL 59.    Allergies  Allergen Reactions   Alogliptin Other (See Comments)    Reaction type/severity unknown   Penicillin G Rash    Current Outpatient Medications  Medication Instructions   acetaminophen (TYLENOL) 650 mg, Oral, Every 6 hours   aspirin EC 81 mg, Oral, Daily   atorvastatin (LIPITOR) 20 mg, Oral, Every evening   Difluprednate 0.05 % EMUL 1 drop, Right Eye, Daily   FeroSul 324 mg, Oral, Every other day   finasteride (PROSCAR) 5 mg, Oral, Every evening, For enlarged prostate   metFORMIN (GLUCOPHAGE) 1,000 mg, Oral, 2 times daily with meals   Multiple Vitamins-Minerals (ONE-A-DAY MENS 50+ PO) 1 tablet, Oral, Every morning   mupirocin ointment (BACTROBAN) 2 % 1 Application, Topical, 2 times daily, Over the yellow gauze twice per day   Polyvinyl Alcohol-Povidone PF 1.4-0.6 % SOLN 1 drop, Ophthalmic, Daily   sodium chloride (MURO 128) 5 % ophthalmic solution 1 drop, Right Eye, Nightly     VITALS:  There were no vitals filed for this visit.    PHYSICAL EXAM   HEENT:  Normocephalic, atraumatic.  The superficial temporal arteries are without ropiness or tenderness. Cardiovascular: Regular rate and rhythm. Lungs: Clear to auscultation bilaterally. Neck: There are no carotid bruits noted bilaterally.  NEUROLOGICAL:     No data to display              No data to display           Orientation:  Alert and oriented to person, not to place and not to time***. No aphasia or dysarthria. Fund of knowledge is reduced. Recent and remote memory impaired.  Attention and  concentration are reduced***.  Able to name objects and repeat phrases /5 ***. Delayed recall  / *** Cranial nerves: There is good facial symmetry. Extraocular muscles are intact and visual fields are full to confrontational testing. Speech is fluent and clear, but slow paced. No tongue deviation. Hearing is intact to conversational tone.*** Tone: Tone is good throughout. Sensation: Sensation is intact to light touch.  Vibration is intact at the bilateral big toe.  Coordination: The patient has no difficulty with RAM's or FNF bilaterally. Normal finger to nose  Motor: Strength is 4/5 in the bilateral upper and lower extremities. There is no pronator drift. There are no fasciculations noted. DTR's: Deep tendon reflexes are 2/4 bilaterally. Gait and Station: The patient is able to ambulate without difficulty. Gait is cautious and narrow. Stride length is normal ***      Thank you for allowing [place] the opportunity to participate in the care of this nice patient. Please  do not hesitate to contact us for any questions or concerns.   Total time spent on today's visit was *** minutes dedicated to this patient today, preparing to see patient, examining the patient, ordering tests and/or medications and counseling the patient, documenting clinical information in the EHR or other health record, independently interpreting results and communicating results to the patient/family, discussing treatment and goals, answering patient's questions and coordinating care.  Cc:  Clinic, Benay Spice [facility] 02/08/2023 6:49 AM

## 2023-02-11 ENCOUNTER — Ambulatory Visit (INDEPENDENT_AMBULATORY_CARE_PROVIDER_SITE_OTHER): Payer: No Typology Code available for payment source | Admitting: Physician Assistant

## 2023-02-11 ENCOUNTER — Encounter: Payer: Self-pay | Admitting: Physician Assistant

## 2023-02-11 ENCOUNTER — Ambulatory Visit: Payer: No Typology Code available for payment source

## 2023-02-11 VITALS — BP 81/48 | HR 85 | Ht 71.0 in | Wt 141.0 lb

## 2023-02-11 DIAGNOSIS — F0280 Dementia in other diseases classified elsewhere without behavioral disturbance: Secondary | ICD-10-CM | POA: Insufficient documentation

## 2023-02-11 DIAGNOSIS — G309 Alzheimer's disease, unspecified: Secondary | ICD-10-CM | POA: Diagnosis not present

## 2023-02-11 DIAGNOSIS — I951 Orthostatic hypotension: Secondary | ICD-10-CM | POA: Diagnosis not present

## 2023-02-11 DIAGNOSIS — I639 Cerebral infarction, unspecified: Secondary | ICD-10-CM

## 2023-02-11 DIAGNOSIS — R404 Transient alteration of awareness: Secondary | ICD-10-CM

## 2023-02-11 NOTE — Patient Instructions (Signed)
It was a pleasure to see you today at our office.   Recommendations:   Follow up in 6  months EEG to rule out seizures  Recommend visiting the website : " [facility]ementia [facility]" to better understand some behaviors related to memory loss.    For psychiatric meds, mood meds: Please have your primary care physician manage these medications.  If you have any severe symptoms of a stroke, or other severe issues such as confusion,severe chills or fever, etc call 911 or go to the ER as you may need to be evaluated further   For guidance regarding [facility] and if placement were needed at the facility, contact Social Worker tel: [phone]  For assessment of decision of mental capacity and competency:  Call Dr. Erick Blinks, geriatric psychiatrist at [phone]  Counseling regarding caregiver distress, including caregiver depression, anxiety and issues regarding community resources, adult day care programs, adult living facilities, or memory care questions:  please contact your  Primary Doctor's Social Worker   Whom to call: Memory  decline, memory medications: Call our office [phone]    [email or web address]   RECOMMENDATIONS FOR ALL PATIENTS WITH MEMORY PROBLEMS: 1. Continue to exercise (Recommend 30 minutes of walking everyday, or 3 hours every week) 2. Increase social interactions - continue going to [place] and enjoy social gatherings with friends and family 3. Eat healthy, avoid fried foods and eat more fruits and vegetables 4. Maintain adequate blood pressure, blood sugar, and blood cholesterol level. Reducing the risk of stroke and cardiovascular disease also helps promoting better memory. 5. Avoid stressful situations. Live a simple life and avoid aggravations. Organize your time and prepare for the next day in anticipation. 6. Sleep well, avoid any interruptions of sleep and avoid any distractions in  the bedroom that may interfere with adequate sleep quality 7. Avoid sugar, avoid sweets as there is a strong link between excessive sugar intake, diabetes, and cognitive impairment We discussed the Mediterranean diet, which has been shown to help patients reduce the risk of progressive memory disorders and reduces cardiovascular risk. This includes eating fish, eat fruits and green leafy vegetables, nuts like almonds and hazelnuts, walnuts, and also use olive oil. Avoid fast foods and fried foods as much as possible. Avoid sweets and sugar as sugar use has been linked to worsening of memory function.  There is always a concern of gradual progression of memory problems. If this is the case, then we may need to adjust level of care according to patient needs. Support, both to the patient and caregiver, should then be put into place.         DRIVING: Regarding driving, in patients with progressive memory problems, driving will be impaired. We advise to have someone else do the driving if trouble finding directions or if minor accidents are reported. Independent driving assessment is available to determine safety of driving.   If you are interested in the driving assessment, you can contact the following:  [facility] [employer] [facility] in [place] [phone]  [facility] [phone]  [facility] [phone]  [facility] [phone] or [phone]   FALL PRECAUTIONS: Be cautious when walking. Scan the area for obstacles that may increase the risk of trips and falls. When getting up in the mornings, sit up at the edge of the bed for a few minutes before getting out of bed. Consider elevating the bed at the head end to avoid drop of blood pressure when getting up. Walk always in  a well-lit room (use night lights in the walls). Avoid area rugs or power cords from appliances in the middle of the walkways. Use a walker or a cane if necessary and consider physical  therapy for balance exercise. Get your eyesight checked regularly.  FINANCIAL OVERSIGHT: Supervision, especially oversight when making financial decisions or transactions is also recommended.  HOME SAFETY: Consider the safety of the kitchen when operating appliances like stoves, microwave oven, and blender. Consider having supervision and share cooking responsibilities until no longer able to participate in those. Accidents with firearms and other hazards in the house should be identified and addressed as well.   ABILITY TO BE LEFT ALONE: If patient is unable to contact 911 operator, consider using LifeLine, or when the need is there, arrange for someone to stay with patients. Smoking is a fire hazard, consider supervision or cessation. Risk of wandering should be assessed by caregiver and if detected at any point, supervision and safe proof recommendations should be instituted.  MEDICATION SUPERVISION: Inability to self-administer medication needs to be constantly addressed. Implement a mechanism to ensure safe administration of the medications.      Mediterranean Diet A Mediterranean diet refers to food and lifestyle choices that are based on the traditions of countries located on the [employer]. This way of eating has been shown to help prevent certain conditions and improve outcomes for people who have chronic diseases, like kidney disease and heart disease. What are tips for following this plan? Lifestyle  Cook and eat meals together with your family, when possible. Drink enough fluid to keep your urine clear or pale yellow. Be physically active every day. This includes: Aerobic exercise like running or swimming. Leisure activities like gardening, walking, or housework. Get 7-8 hours of sleep each night. If recommended by your health care provider, drink red wine in moderation. This means 1 glass a day for nonpregnant women and 2 glasses a day for men. A glass of wine equals 5 oz  (150 mL). Reading food labels  Check the serving size of packaged foods. For foods such as rice and pasta, the serving size refers to the amount of cooked product, not dry. Check the total fat in packaged foods. Avoid foods that have saturated fat or trans fats. Check the ingredients list for added sugars, such as corn syrup. Shopping  At the grocery store, buy most of your food from the areas near the walls of the store. This includes: Fresh fruits and vegetables (produce). Grains, beans, nuts, and seeds. Some of these may be available in unpackaged forms or large amounts (in bulk). Fresh seafood. Poultry and eggs. Low-fat dairy products. Buy whole ingredients instead of prepackaged foods. Buy fresh fruits and vegetables in-season from local farmers markets. Buy frozen fruits and vegetables in resealable bags. If you do not have access to quality fresh seafood, buy precooked frozen shrimp or canned fish, such as tuna, salmon, or sardines. Buy small amounts of raw or cooked vegetables, salads, or olives from the deli or salad bar at your store. Stock your pantry so you always have certain foods on hand, such as olive oil, canned tuna, canned tomatoes, rice, pasta, and beans. Cooking  Cook foods with extra-virgin olive oil instead of using butter or other vegetable oils. Have meat as a side dish, and have vegetables or grains as your main dish. This means having meat in small portions or adding small amounts of meat to foods like pasta or stew. Use beans or vegetables instead  of meat in common dishes like chili or lasagna. Experiment with different cooking methods. Try roasting or broiling vegetables instead of steaming or sauteing them. Add frozen vegetables to soups, stews, pasta, or rice. Add nuts or seeds for added healthy fat at each meal. You can add these to yogurt, salads, or vegetable dishes. Marinate fish or vegetables using olive oil, lemon juice, garlic, and fresh herbs. Meal  planning  Plan to eat 1 vegetarian meal one day each week. Try to work up to 2 vegetarian meals, if possible. Eat seafood 2 or more times a week. Have healthy snacks readily available, such as: Vegetable sticks with hummus. Greek yogurt. Fruit and nut trail mix. Eat balanced meals throughout the week. This includes: Fruit: 2-3 servings a day Vegetables: 4-5 servings a day Low-fat dairy: 2 servings a day Fish, poultry, or lean meat: 1 serving a day Beans and legumes: 2 or more servings a week Nuts and seeds: 1-2 servings a day Whole grains: 6-8 servings a day Extra-virgin olive oil: 3-4 servings a day Limit red meat and sweets to only a few servings a month What are my food choices? Mediterranean diet Recommended Grains: Whole-grain pasta. Brown rice. Bulgar wheat. Polenta. Couscous. Whole-wheat bread. Orpah Cobb. Vegetables: Artichokes. Beets. Broccoli. Cabbage. Carrots. Eggplant. Green beans. Chard. Kale. Spinach. Onions. Leeks. Peas. Squash. Tomatoes. Peppers. Radishes. Fruits: Apples. Apricots. Avocado. Berries. Bananas. Cherries. Dates. Figs. Grapes. Lemons. Melon. Oranges. Peaches. Plums. Pomegranate. Meats and other protein foods: Beans. Almonds. Sunflower seeds. Pine nuts. Peanuts. Cod. Salmon. Scallops. Shrimp. Tuna. Tilapia. Clams. Oysters. Eggs. Dairy: Low-fat milk. Cheese. Greek yogurt. Beverages: Water. Red wine. Herbal tea. Fats and oils: Extra virgin olive oil. Avocado oil. Grape seed oil. Sweets and desserts: [place] yogurt with honey. Baked apples. Poached pears. Trail mix. Seasoning and other foods: Basil. Cilantro. Coriander. Cumin. Mint. Parsley. Sage. Rosemary. Tarragon. Garlic. Oregano. Thyme. Pepper. Balsalmic vinegar. Tahini. Hummus. Tomato sauce. Olives. Mushrooms. Limit these Grains: Prepackaged pasta or rice dishes. Prepackaged cereal with added sugar. Vegetables: Deep fried potatoes (french fries). Fruits: Fruit canned in syrup. Meats and other protein  foods: Beef. Pork. Lamb. Poultry with skin. Hot dogs. Tomasa Blase. Dairy: Ice cream. Sour cream. Whole milk. Beverages: Juice. Sugar-sweetened soft drinks. Beer. Liquor and spirits. Fats and oils: Butter. Canola oil. Vegetable oil. Beef fat (tallow). Lard. Sweets and desserts: Cookies. Cakes. Pies. Candy. Seasoning and other foods: Mayonnaise. Premade sauces and marinades. The items listed may not be a complete list. Talk with your dietitian about what dietary choices are right for you. Summary The Mediterranean diet includes both food and lifestyle choices. Eat a variety of fresh fruits and vegetables, beans, nuts, seeds, and whole grains. Limit the amount of red meat and sweets that you eat. Talk with your health care provider about whether it is safe for you to drink red wine in moderation. This means 1 glass a day for nonpregnant women and 2 glasses a day for men. A glass of wine equals 5 oz (150 mL). This information is not intended to replace advice given to you by your health care provider. Make sure you discuss any questions you have with your health care provider. Document Released: 09/22/2015 Document Revised: 10/25/2015 Document Reviewed: 09/22/2015 Elsevier Interactive Patient Education  2017 [employer].

## 2023-02-25 ENCOUNTER — Encounter: Payer: Self-pay | Admitting: Physician Assistant

## 2023-02-25 ENCOUNTER — Other Ambulatory Visit: Payer: No Typology Code available for payment source

## 2023-03-04 ENCOUNTER — Ambulatory Visit (INDEPENDENT_AMBULATORY_CARE_PROVIDER_SITE_OTHER): Payer: No Typology Code available for payment source | Admitting: Neurology

## 2023-03-04 DIAGNOSIS — R404 Transient alteration of awareness: Secondary | ICD-10-CM

## 2023-03-04 NOTE — Progress Notes (Unsigned)
EEG complete - results pending 

## 2023-03-07 NOTE — Progress Notes (Signed)
EEG normal, no seizures seen, thanks

## 2023-03-07 NOTE — Procedures (Signed)
ELECTROENCEPHALOGRAM REPORT  Date of Study: 03/04/2023  Patient's Name: Paul Colon MRN: [id number] Date of Birth: [birth year]  Referring Provider: Marlowe Kays, PA-C  Clinical History: This is a 89 year old man with episodes of decreased responsiveness. EEG for classification.  CNS Active Medications: none  Technical Summary: A multichannel digital 1-hour EEG recording measured by the international 10-20 system with electrodes applied with paste and impedances below 5000 ohms performed in our laboratory with EKG monitoring in a predominantly drowsy and asleep patient.  Hyperventilation and photic stimulation were not performed.  The digital EEG was referentially recorded, reformatted, and digitally filtered in a variety of bipolar and referential montages for optimal display.    Description: The patient is predominantly drowsy and asleep during the recording.  During brief period of wakefulness, there is a symmetric, medium voltage 8 Hz posterior dominant rhythm that attenuates with eye opening.  The record is symmetric.  During drowsiness and sleep, there is an increase in theta slowing of the background.  Vertex waves and poorly formed sleep spindles were seen. There were no epileptiform discharges or electrographic seizures seen.    EKG lead was unremarkable.  Impression: This 1-hour brief wake and sleep EEG is within normal limits  Clinical Correlation: A normal EEG does not exclude a clinical diagnosis of epilepsy.  If further clinical questions remain, prolonged EEG may be helpful.  Clinical correlation is advised.   Patrcia Dolly, M.D.

## 2023-03-07 NOTE — Progress Notes (Signed)
1 h EEG is normal, no seizure, thanks

## 2023-06-04 ENCOUNTER — Encounter: Payer: Self-pay | Admitting: Physician Assistant

## 2023-06-06 IMAGING — DX DG CHEST 1V PORT
1 series · 1 of 1 positions shown · non-contrast
Comparison: None.

CLINICAL DATA: COVID, shortness of breath

EXAM:
PORTABLE CHEST 1 VIEW

[chest ap]
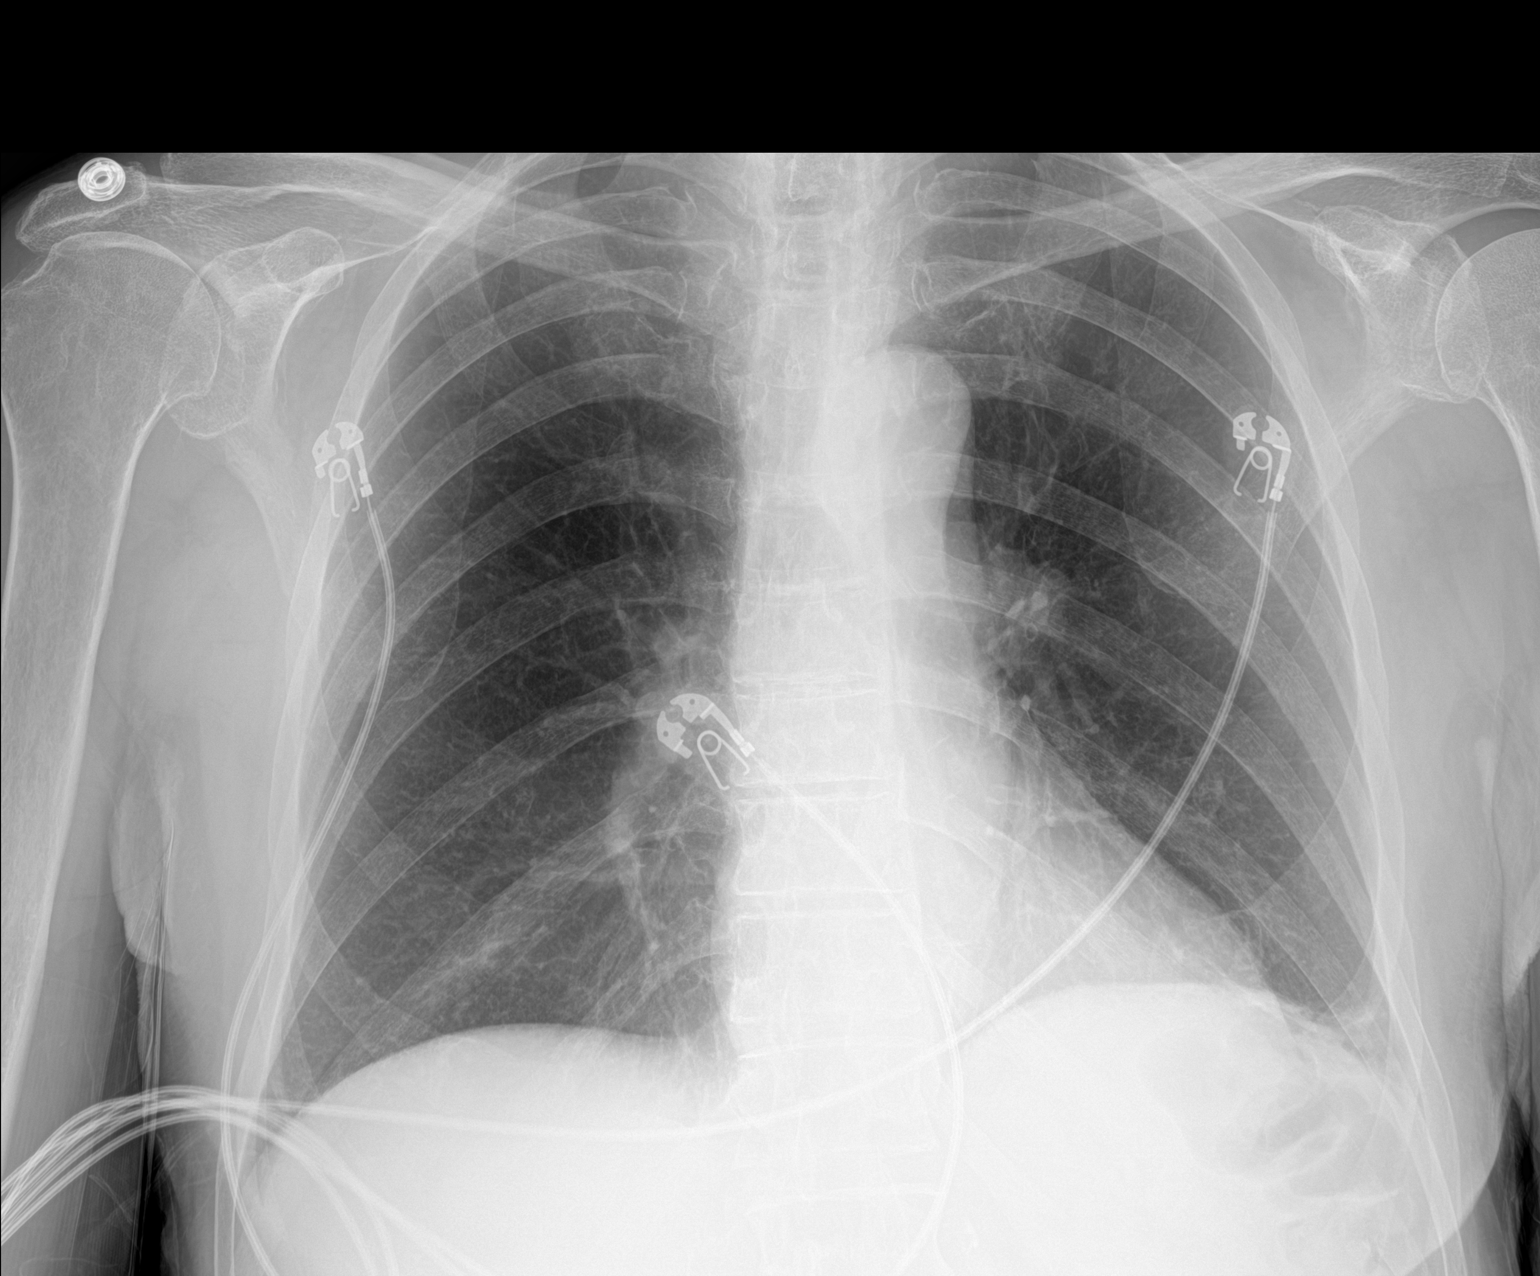

[1 of 1 positions shown; findings below may reference images not displayed]

FINDINGS: Heart is normal size. Left base atelectasis. Right lung clear. No
effusions or acute bony abnormality.
IMPRESSION: Left base atelectasis.

## 2023-06-18 ENCOUNTER — Emergency Department (HOSPITAL_COMMUNITY)

## 2023-06-18 ENCOUNTER — Inpatient Hospital Stay (HOSPITAL_COMMUNITY)
Admission: EM | Admit: 2023-06-18 | Discharge: 2023-07-14 | DRG: 951 | Disposition: E | Attending: Pulmonary Disease | Admitting: Pulmonary Disease

## 2023-06-18 ENCOUNTER — Encounter (HOSPITAL_COMMUNITY): Payer: Self-pay

## 2023-06-18 DIAGNOSIS — Y92009 Unspecified place in unspecified non-institutional (private) residence as the place of occurrence of the external cause: Secondary | ICD-10-CM

## 2023-06-18 DIAGNOSIS — Z7982 Long term (current) use of aspirin: Secondary | ICD-10-CM

## 2023-06-18 DIAGNOSIS — R001 Bradycardia, unspecified: Secondary | ICD-10-CM | POA: Diagnosis present

## 2023-06-18 DIAGNOSIS — N189 Chronic kidney disease, unspecified: Secondary | ICD-10-CM | POA: Diagnosis present

## 2023-06-18 DIAGNOSIS — F03C0 Unspecified dementia, severe, without behavioral disturbance, psychotic disturbance, mood disturbance, and anxiety: Secondary | ICD-10-CM | POA: Diagnosis present

## 2023-06-18 DIAGNOSIS — R29818 Other symptoms and signs involving the nervous system: Secondary | ICD-10-CM | POA: Diagnosis present

## 2023-06-18 DIAGNOSIS — Z880 Allergy status to penicillin: Secondary | ICD-10-CM

## 2023-06-18 DIAGNOSIS — I959 Hypotension, unspecified: Secondary | ICD-10-CM | POA: Diagnosis present

## 2023-06-18 DIAGNOSIS — E1122 Type 2 diabetes mellitus with diabetic chronic kidney disease: Secondary | ICD-10-CM | POA: Diagnosis present

## 2023-06-18 DIAGNOSIS — Z79899 Other long term (current) drug therapy: Secondary | ICD-10-CM

## 2023-06-18 DIAGNOSIS — Z7984 Long term (current) use of oral hypoglycemic drugs: Secondary | ICD-10-CM | POA: Diagnosis not present

## 2023-06-18 DIAGNOSIS — I469 Cardiac arrest, cause unspecified: Secondary | ICD-10-CM

## 2023-06-18 DIAGNOSIS — Z66 Do not resuscitate: Secondary | ICD-10-CM | POA: Diagnosis present

## 2023-06-18 DIAGNOSIS — N400 Enlarged prostate without lower urinary tract symptoms: Secondary | ICD-10-CM | POA: Diagnosis present

## 2023-06-18 DIAGNOSIS — T17908A Unspecified foreign body in respiratory tract, part unspecified causing other injury, initial encounter: Secondary | ICD-10-CM | POA: Diagnosis present

## 2023-06-18 DIAGNOSIS — Z515 Encounter for palliative care: Principal | ICD-10-CM

## 2023-06-18 DIAGNOSIS — I468 Cardiac arrest due to other underlying condition: Secondary | ICD-10-CM | POA: Diagnosis present

## 2023-06-18 DIAGNOSIS — Z888 Allergy status to other drugs, medicaments and biological substances status: Secondary | ICD-10-CM | POA: Diagnosis not present

## 2023-06-18 DIAGNOSIS — W44F3XA Food entering into or through a natural orifice, initial encounter: Principal | ICD-10-CM | POA: Diagnosis present

## 2023-06-18 DIAGNOSIS — J9601 Acute respiratory failure with hypoxia: Secondary | ICD-10-CM | POA: Diagnosis present

## 2023-06-18 DIAGNOSIS — T17520A Food in bronchus causing asphyxiation, initial encounter: Secondary | ICD-10-CM | POA: Diagnosis present

## 2023-06-18 DIAGNOSIS — Z96643 Presence of artificial hip joint, bilateral: Secondary | ICD-10-CM | POA: Diagnosis present

## 2023-06-18 LAB — URINALYSIS, ROUTINE W REFLEX MICROSCOPIC
Bilirubin Urine: NEGATIVE
Glucose, UA: >=500 mg/dL — AB
Ketones, ur: NEGATIVE mg/dL
Leukocytes,Ua: NEGATIVE
Nitrite: NEGATIVE
Protein, ur: NEGATIVE mg/dL
Specific Gravity, Urine: 1.02 (ref 1.005–1.030)
pH: 5 (ref 5.0–8.0)

## 2023-06-18 LAB — COMPREHENSIVE METABOLIC PANEL WITH GFR
ALT: 23 U/L (ref 0–44)
AST: 55 U/L — ABNORMAL HIGH (ref 15–41)
Albumin: 2.3 g/dL — ABNORMAL LOW (ref 3.5–5.0)
Alkaline Phosphatase: 104 U/L (ref 38–126)
Anion gap: 21 — ABNORMAL HIGH (ref 5–15)
BUN: 99 mg/dL — ABNORMAL HIGH (ref 8–23)
CO2: 10 mmol/L — ABNORMAL LOW (ref 22–32)
Calcium: 9.9 mg/dL (ref 8.9–10.3)
Chloride: 128 mmol/L — ABNORMAL HIGH (ref 98–111)
Creatinine, Ser: 3.03 mg/dL — ABNORMAL HIGH (ref 0.61–1.24)
GFR, Estimated: 19 mL/min — ABNORMAL LOW (ref >60)
Glucose, Bld: 433 mg/dL — ABNORMAL HIGH (ref 70–99)
Potassium: 5.2 mmol/L — ABNORMAL HIGH (ref 3.5–5.1)
Sodium: 159 mmol/L — ABNORMAL HIGH (ref 135–145)
Total Bilirubin: 0.5 mg/dL (ref 0.0–1.2)
Total Protein: 6 g/dL — ABNORMAL LOW (ref 6.5–8.1)

## 2023-06-18 LAB — I-STAT CG4 LACTIC ACID, ED: Lactic Acid, Venous: 11.4 mmol/L (ref 0.5–1.9)

## 2023-06-18 LAB — I-STAT VENOUS BLOOD GAS, ED
Acid-base deficit: 25 mmol/L — ABNORMAL HIGH (ref 0.0–2.0)
Bicarbonate: 8.5 mmol/L — ABNORMAL LOW (ref 20.0–28.0)
Calcium, Ion: 1.28 mmol/L (ref 1.15–1.40)
HCT: 30 % — ABNORMAL LOW (ref 39.0–52.0)
Hemoglobin: 10.2 g/dL — ABNORMAL LOW (ref 13.0–17.0)
O2 Saturation: 81 %
Potassium: 5 mmol/L (ref 3.5–5.1)
Sodium: 159 mmol/L — ABNORMAL HIGH (ref 135–145)
TCO2: 10 mmol/L — ABNORMAL LOW (ref 22–32)
pCO2, Ven: 53.5 mmHg (ref 44–60)
pH, Ven: 6.808 — CL (ref 7.25–7.43)
pO2, Ven: 84 mmHg — ABNORMAL HIGH (ref 32–45)

## 2023-06-18 LAB — I-STAT CHEM 8, ED
BUN: 98 mg/dL — ABNORMAL HIGH (ref 8–23)
Calcium, Ion: 1.29 mmol/L (ref 1.15–1.40)
Chloride: 130 mmol/L — ABNORMAL HIGH (ref 98–111)
Creatinine, Ser: 2.7 mg/dL — ABNORMAL HIGH (ref 0.61–1.24)
Glucose, Bld: 423 mg/dL — ABNORMAL HIGH (ref 70–99)
HCT: 33 % — ABNORMAL LOW (ref 39.0–52.0)
Hemoglobin: 11.2 g/dL — ABNORMAL LOW (ref 13.0–17.0)
Potassium: 5.1 mmol/L (ref 3.5–5.1)
Sodium: 160 mmol/L — ABNORMAL HIGH (ref 135–145)
TCO2: 11 mmol/L — ABNORMAL LOW (ref 22–32)

## 2023-06-18 LAB — CBC
HCT: 36.2 % — ABNORMAL LOW (ref 39.0–52.0)
Hemoglobin: 10.3 g/dL — ABNORMAL LOW (ref 13.0–17.0)
MCH: 29.8 pg (ref 26.0–34.0)
MCHC: 28.5 g/dL — ABNORMAL LOW (ref 30.0–36.0)
MCV: 104.6 fL — ABNORMAL HIGH (ref 80.0–100.0)
Platelets: 382 10*3/uL (ref 150–400)
RBC: 3.46 MIL/uL — ABNORMAL LOW (ref 4.22–5.81)
RDW: 15.2 % (ref 11.5–15.5)
WBC: 17.1 10*3/uL — ABNORMAL HIGH (ref 4.0–10.5)
nRBC: 0.1 % (ref 0.0–0.2)

## 2023-06-18 LAB — CBG MONITORING, ED: Glucose-Capillary: 371 mg/dL — ABNORMAL HIGH (ref 70–99)

## 2023-06-18 MED ORDER — MIDAZOLAM HCL 2 MG/2ML IJ SOLN
1.0000 mg | INTRAMUSCULAR | Status: DC | PRN
  Administered 2023-06-18: 2 mg via INTRAVENOUS
  Filled 2023-06-18: qty 2

## 2023-06-18 MED ORDER — SODIUM CHLORIDE 0.9 % IV SOLN
INTRAVENOUS | Status: AC | PRN
  Administered 2023-06-18: 1000 mL via INTRAVENOUS

## 2023-06-18 MED ORDER — MORPHINE 100MG IN NS 100ML (1MG/ML) PREMIX INFUSION
0.0000 mg/h | INTRAVENOUS | Status: DC
  Administered 2023-06-18: 5 mg/h via INTRAVENOUS
  Filled 2023-06-18: qty 100

## 2023-06-18 MED ORDER — POLYVINYL ALCOHOL 1.4 % OP SOLN: 1.0000 [drp] | Freq: Four times a day (QID) | OPHTHALMIC | Status: DC | PRN

## 2023-06-18 MED ORDER — ACETAMINOPHEN 650 MG RE SUPP: 650.0000 mg | Freq: Four times a day (QID) | RECTAL | Status: DC | PRN

## 2023-06-18 MED ORDER — FENTANYL CITRATE PF 50 MCG/ML IJ SOSY
25.0000 ug | PREFILLED_SYRINGE | INTRAMUSCULAR | Status: DC | PRN
  Administered 2023-06-18: 100 ug via INTRAVENOUS
  Filled 2023-06-18: qty 2

## 2023-06-18 MED ORDER — ATROPINE SULFATE 1 MG/10ML IJ SOSY
PREFILLED_SYRINGE | INTRAMUSCULAR | Status: AC | PRN
  Administered 2023-06-18 (×2): 1 mg via INTRAVENOUS

## 2023-06-18 MED ORDER — FENTANYL CITRATE PF 50 MCG/ML IJ SOSY: 25.0000 ug | PREFILLED_SYRINGE | INTRAMUSCULAR | Status: DC | PRN

## 2023-06-18 MED ORDER — MORPHINE BOLUS VIA INFUSION
5.0000 mg | INTRAVENOUS | Status: DC | PRN
  Administered 2023-06-18: 5 mg via INTRAVENOUS

## 2023-06-18 MED ORDER — EPINEPHRINE HCL 5 MG/250ML IV SOLN IN NS
0.5000 ug/min | INTRAVENOUS | Status: DC
  Administered 2023-06-18: 20 ug/min via INTRAVENOUS
  Filled 2023-06-18: qty 250

## 2023-06-18 MED ORDER — SODIUM CHLORIDE 0.9 % IV SOLN: INTRAVENOUS | Status: DC

## 2023-06-18 MED ORDER — NOREPINEPHRINE 4 MG/250ML-% IV SOLN
0.0000 ug/min | INTRAVENOUS | Status: DC
  Administered 2023-06-18: 40 ug/min via INTRAVENOUS
  Administered 2023-06-18: 10 ug/min via INTRAVENOUS
  Filled 2023-06-18 (×2): qty 250

## 2023-06-18 MED ORDER — EPINEPHRINE 1 MG/10ML IJ SOSY
PREFILLED_SYRINGE | INTRAMUSCULAR | Status: AC | PRN
  Administered 2023-06-18 (×2): 1 mg via INTRAVENOUS

## 2023-06-18 MED ORDER — ACETAMINOPHEN 325 MG PO TABS: 650.0000 mg | ORAL_TABLET | Freq: Four times a day (QID) | ORAL | Status: DC | PRN

## 2023-07-14 NOTE — Code Documentation (Signed)
Merrily Pew, MD at bedside.

## 2023-07-14 NOTE — ED Notes (Signed)
 Critical Care MD and RT at bedside for extubation.

## 2023-07-14 NOTE — ED Notes (Signed)
 Family at bedside.

## 2023-07-14 NOTE — Progress Notes (Signed)
 Chaplain responded to call to bedside of pt to offer support for family who had just moved their father/grandfather to comfort care.  Chaplain engaged in ministry of presence while daughter spoke of her dad having stayed with the family since he contracted Covid 2  years ago.  She spoke of the many trips to this hospital with him and how his death today came as a shock.  Chaplain offered ministry of hospitality by procured refreshment for daughter and educated family on next steps.  Chaplain prayed at bedside with family.   [name] Chaplain

## 2023-07-14 NOTE — ED Notes (Signed)
Pt extubated at this time

## 2023-07-14 NOTE — ED Triage Notes (Signed)
 Pt BIB [facility] from home d/t choking on his egg breakfast, family gave abd thrust & he did breath some. EMS arrived & he was very cyanotic, lost pulses & CPR started on scene at 1446. EMS reports en route he had 4 d-fibs, 4 Epi's & 300 Amio. ROSC obtained by them at 1514 & lost pulses again upon arrival to ED. Was intubated with a 7.0 ET, a lot of eggs was suctioned from his airway, he was oxygenating some with BVM assit. 1512 last Epi via IO in Lt leg.

## 2023-07-14 NOTE — ED Notes (Signed)
 EDP Tegeler removed some eggs out of airway while using bronchoscope.

## 2023-07-14 NOTE — ED Notes (Signed)
 Family not wanting to withdraw care at this time d/t waiting on one more person (per family) while EDP Tegeler at bedside.

## 2023-07-14 NOTE — ED Notes (Signed)
 EDP Tegeler pacing pt, capturing at 80, 70 bpm at this time.

## 2023-07-14 NOTE — ED Notes (Signed)
 Time of death 21:19. 2 RN's pronounced time of death.

## 2023-07-14 NOTE — Code Documentation (Signed)
 Family sent to consultation room.

## 2023-07-14 NOTE — IPAL (Signed)
  Interdisciplinary Goals of Care Family Meeting   Date carried out: 06/21/2023  Location of the meeting: Conference room  Member's involved: Physician, Bedside Registered Nurse, and Family Member or next of kin  Durable Power of Attorney or acting medical decision maker: Paul Colon    Discussion: We discussed goals of care for Paul Colon .    The Clinical status was relayed to daughter and her husband in conference room in detail.   Updated and notified of patients medical condition.   Patient remains unresponsive and will not open eyes to command.  He has signs of neurological injury, currently his blood pressure is very low requiring multiple medications to keep his blood pressure up.  His heart has stopped beating, he has been artificially paced  He has evidence of multisystem organ failure   Patient with Progressive multiorgan failure with a very high probablity of a very minimal chance of meaningful recovery despite all aggressive and optimal medical therapy.  Code status: Full DNR  Disposition: In-patient comfort care    Family are satisfied with Plan of action and management. All questions answered   Trevor Fudge MD [facility] See Amion for pager If no response to pager, please call [phone] until 7pm After 7pm, Please call E-link [phone]

## 2023-07-14 NOTE — Code Documentation (Signed)
 Paul Colon going at time of arrival

## 2023-07-14 NOTE — ED Notes (Signed)
 No sedation on board at this time.

## 2023-07-14 NOTE — ED Notes (Signed)
 Elink contacted to transition pt to comfort care at this time.

## 2023-07-14 NOTE — ED Notes (Signed)
 EDP Tegeler informed pt decompensating & more family has made it to bedside.

## 2023-07-14 NOTE — ED Notes (Signed)
 X-ray at bedside

## 2023-07-14 NOTE — H&P (Signed)
 NAME:  Paul Colon, MRN:  [id number], DOB:  1929/03/21, LOS: 0 ADMISSION DATE:  07/10/2023, CONSULTATION DATE:  06/19/2023 REFERRING MD:  EDP, CHIEF COMPLAINT:  aspiration   History of Present Illness:  89 y.o. M who aspirated eggs today and suffered a cardiac arrest with at least 30 mins down time.  He was unresponsive without sedation and severely bradycardic and hypotensive on maximum pressors.  Family at the bedside and made the decision to transfer to comfort care once all family has arrived   Pertinent  Medical History   has a past medical history of Diabetes mellitus without complication (HCC), Kidney disease, and Severe dementia (HCC) (12/05/2022).   Significant Hospital Events: Including procedures, antibiotic start and stop dates in addition to other pertinent events     Interim History / Subjective:  As above  Objective   Blood pressure (!) 169/87, pulse 75, temperature (!) 93.2 F (34 C), resp. rate (!) 56, SpO2 100%.    Vent Mode: PRVC FiO2 (%):  [100 %] 100 % Set Rate:  [15 bmp] 15 bmp Vt Set:  [500 mL] 500 mL PEEP:  [5 cmH20] 5 cmH20 Plateau Pressure:  [14 cmH20] 14 cmH20  No intake or output data in the 24 hours ending 06/28/2023 1744 There were no vitals filed for this visit.  General:  critically ill elderly M unresponsive and intubated HEENT: MM pink/moist Neuro: unresponsive off sedation with fixed and dilated pupils and no gag CV: s1s2 bradycardic, externally paced , no m/r/g PULM:  rhonchi bilaterally  Extremities: warm/dry, no edema  Skin: no rashes or lesions  Resolved Hospital Problem list     Assessment & Plan:   Witnessed out of hospital cardiac arrest and aspiration event  -support with pressors and ventilator until all family has arrived and then transition to comfort care  Best Practice (right click and "Reselect all SmartList Selections" daily)   Diet/type: NPO DVT prophylaxis not indicated Pressure ulcer(s): N/A GI prophylaxis:  N/A Lines: N/A Foley:  N/A Code Status:  DNR Last date of multidisciplinary goals of care discussion [5/6]  Labs   CBC: Recent Labs  Lab 07/13/2023 1558 06/14/2023 1608  WBC 17.1*  --   HGB 10.3* 10.2*  11.2*  HCT 36.2* 30.0*  33.0*  MCV 104.6*  --   PLT 382  --     Basic Metabolic Panel: Recent Labs  Lab 07/13/2023 1608  NA 159*  160*  K 5.0  5.1  CL 130*  GLUCOSE 423*  BUN 98*  CREATININE 2.70*   GFR: CrCl cannot be calculated (Unknown ideal weight.). Recent Labs  Lab 06/16/2023 1558 07/09/2023 1608  WBC 17.1*  --   LATICACIDVEN  --  11.4*    Liver Function Tests: No results for input(s): "AST", "ALT", "ALKPHOS", "BILITOT", "PROT", "ALBUMIN " in the last 168 hours. No results for input(s): "LIPASE", "AMYLASE" in the last 168 hours. No results for input(s): "AMMONIA" in the last 168 hours.  ABG    Component Value Date/Time   HCO3 8.5 (L) 07/11/2023 1608   TCO2 11 (L) 07/04/2023 1608   TCO2 10 (L) 07/08/2023 1608   ACIDBASEDEF 25.0 (H) 07/05/2023 1608   O2SAT 81 06/19/2023 1608     Coagulation Profile: No results for input(s): "INR", "PROTIME" in the last 168 hours.  Cardiac Enzymes: No results for input(s): "CKTOTAL", "CKMB", "CKMBINDEX", "TROPONINI" in the last 168 hours.  HbA1C: Hgb A1c MFr Bld  Date/Time Value Ref Range Status  12/06/2022 08:14 AM 8.4 (H)  4.8 - 5.6 % Final    Comment:    (NOTE) Pre diabetes:          5.7%-6.4%  Diabetes:              >6.4%  Glycemic control for   <7.0% adults with diabetes   09/06/2021 06:28 AM 7.3 (H) 4.8 - 5.6 % Final    Comment:    (NOTE) Pre diabetes:          5.7%-6.4%  Diabetes:              >6.4%  Glycemic control for   <7.0% adults with diabetes     CBG: Recent Labs  Lab 07/03/2023 1559  GLUCAP 371*    Review of Systems:   Unable to obtain  Past Medical History:  He,  has a past medical history of Diabetes mellitus without complication (HCC), Kidney disease, and Severe dementia (HCC)  (12/05/2022).   Surgical History:   Past Surgical History:  Procedure Laterality Date   ANTERIOR APPROACH HEMI HIP ARTHROPLASTY Right 12/07/2022   Procedure: ANTERIOR APPROACH HEMI HIP ARTHROPLASTY;  Surgeon: Adonica Hoose, MD;  Location: [facility] OR;  Service: Orthopedics;  Laterality: Right;   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   Procedure: TOTAL HIP ARTHROPLASTY ANTERIOR APPROACH;  Surgeon: Claiborne Crew, MD;  Location: [facility] OR;  Service: Orthopedics;  Laterality: Left;     Social History:   reports that he has never smoked. He has never used smokeless tobacco. He reports that he does not drink alcohol  and does not use drugs.   Family History:  His family history is not on file.   Allergies Allergies  Allergen Reactions   Alogliptin Other (See Comments)    Reaction type/severity unknown   Penicillin G Rash     Home Medications  Prior to Admission medications   Medication Sig Start Date End Date Taking? Authorizing Provider  acetaminophen  (TYLENOL ) 325 MG tablet Take 2 tablets (650 mg total) by mouth every 6 (six) hours. Patient not taking: Reported on 02/11/2023 12/11/22   Dameron, Marisa, DO  aspirin  EC 81 MG tablet Take 1 tablet (81 mg total) by mouth daily. 12/26/22   Glenn Lange, DO  atorvastatin  (LIPITOR) 20 MG tablet Take 1 tablet (20 mg total) by mouth every evening. 01/25/22   Vann, Jessica U, DO  Difluprednate  0.05 % EMUL Place 1 drop into the right eye daily. Patient not taking: Reported on 02/11/2023    [provider]  ferrous sulfate  325 (65 FE) MG tablet Take 1 tablet (324 mg total) by mouth every other day. 12/26/22   Glenn Lange, DO  finasteride  (PROSCAR ) 5 MG tablet Take 5 mg by mouth every evening. For enlarged prostate    [provider]  metFORMIN  (GLUCOPHAGE ) 1000 MG tablet Take 1,000 mg by mouth 2 (two) times daily with a meal.    [provider]  Multiple Vitamins-Minerals (ONE-A-DAY MENS 50+ PO) Take 1 tablet by mouth in the  morning.    [provider]  mupirocin  ointment (BACTROBAN ) 2 % Apply 1 Application topically 2 (two) times daily. Over the yellow gauze twice per day Patient not taking: Reported on 02/11/2023 12/05/22   Patel, Kunjan B, MD  Polyvinyl Alcohol -Povidone PF 1.4-0.6 % SOLN Apply 1 drop to eye daily. Patient not taking: Reported on 02/11/2023 12/21/22   [provider]  sodium chloride  (MURO 128) 5 % ophthalmic solution Place 1 drop into the right eye at bedtime.    [provider]     Critical care time: 45 minutes       CRITICAL CARE Performed by: Patt Boozer Keo Schirmer   Total critical care time: 45 minutes  Critical care time was exclusive of separately billable procedures and treating other patients.  Critical care was necessary to treat or prevent imminent or life-threatening deterioration.  Critical care was time spent personally by me on the following activities: development of treatment plan with patient and/or surrogate as well as nursing, discussions with consultants, evaluation of patient's response to treatment, examination of patient, obtaining history from patient or surrogate, ordering and performing treatments and interventions, ordering and review of laboratory studies, ordering and review of radiographic studies, pulse oximetry and re-evaluation of patient's condition.   Patt Boozer Jessicca Stitzer, PA-C [facility] See Amion for pager If no response to pager , please call [phone] until 7pm After 7:00 pm call Elink  [phone]

## 2023-07-14 NOTE — ED Notes (Signed)
 EDP Tegeler informed this RN that family has requested more family to arrive before progressing with comfort care measures. EDP informed this RN to keep pressures at the rate they are & leave pacing on at the going rate with no changes & if he decompensates not to titrate pressors.

## 2023-07-14 NOTE — ED Notes (Signed)
 Family remains at bedside.

## 2023-07-14 NOTE — Progress Notes (Signed)
 Pt seen in the ED after cardiac arrest initiated by choking and aspiration event, approximate down time 30 minutes and shocked x4.  Pt was intubated in the field without sedation and has remained unresponsive.  On arrival to the ED, pulses were lost again requiring 1 round of CPR, he was then bradycardic to the 20's and hypotensive requiring maximum Epi and Norepi infusions.  Family meeting with Dr. Sonja [place] R Kindsey Eblin, PA-C [facility] See Amion for pager If no response to pager , please call [phone] until 7pm After 7:00 pm call Elink  [phone]

## 2023-07-14 NOTE — ED Notes (Signed)
 Pacing stopped by EDP Tegeler with APP at bedside, pt still brady & pacing restarted. No family at beside.

## 2023-07-14 NOTE — ED Provider Notes (Signed)
 [place] EMERGENCY DEPARTMENT AT [facility] Provider Note   CSN: [id number] Arrival date & time: 07/01/2023  1521     History  Chief Complaint  Patient presents with   CPR    Paul Colon is a 89 y.o. male.  The history is provided by medical records and the EMS personnel. The history is limited by the condition of the patient.  Cardiac Arrest Witnessed by:  Family member Incident location:  Home Initial cardiac rhythm per EMS:  Normal sinus Rhythm on admission to ED:  Sinus bradycardia Associated symptoms: shortness of breath        Home Medications Prior to Admission medications   Medication Sig Start Date End Date Taking? Authorizing Provider  acetaminophen  (TYLENOL ) 325 MG tablet Take 2 tablets (650 mg total) by mouth every 6 (six) hours. Patient not taking: Reported on 02/11/2023 12/11/22   Dameron, Marisa, DO  aspirin  EC 81 MG tablet Take 1 tablet (81 mg total) by mouth daily. 12/26/22   Glenn Lange, DO  atorvastatin  (LIPITOR) 20 MG tablet Take 1 tablet (20 mg total) by mouth every evening. 01/25/22   Vann, Jessica U, DO  Difluprednate  0.05 % EMUL Place 1 drop into the right eye daily. Patient not taking: Reported on 02/11/2023    [provider]  ferrous sulfate  325 (65 FE) MG tablet Take 1 tablet (324 mg total) by mouth every other day. 12/26/22   Glenn Lange, DO  finasteride  (PROSCAR ) 5 MG tablet Take 5 mg by mouth every evening. For enlarged prostate    [provider]  metFORMIN  (GLUCOPHAGE ) 1000 MG tablet Take 1,000 mg by mouth 2 (two) times daily with a meal.    [provider]  Multiple Vitamins-Minerals (ONE-A-DAY MENS 50+ PO) Take 1 tablet by mouth in the morning.    [provider]  mupirocin  ointment (BACTROBAN ) 2 % Apply 1 Application topically 2 (two) times daily. Over the yellow gauze twice per day Patient not taking: Reported on 02/11/2023 12/05/22   Patel, Kunjan B, MD  Polyvinyl Alcohol -Povidone PF  1.4-0.6 % SOLN Apply 1 drop to eye daily. Patient not taking: Reported on 02/11/2023 12/21/22   [provider]  sodium chloride  (MURO 128) 5 % ophthalmic solution Place 1 drop into the right eye at bedtime.    [provider]      Allergies    Alogliptin and Penicillin g    Review of Systems   Review of Systems  Unable to perform ROS: Patient unresponsive  Respiratory:  Positive for choking and shortness of breath.     Physical Exam Updated Vital Signs BP (!) 114/44 (BP Location: Left Arm)   Pulse 70   Resp (!) 47   SpO2 97%  Physical Exam Vitals and nursing note reviewed.  Constitutional:      General: He is in acute distress.     Appearance: He is well-developed.  HENT:     Nose: No congestion.  Eyes:     Conjunctiva/sclera: Conjunctivae normal.  Cardiovascular:     Rate and Rhythm: Bradycardia present. Rhythm irregular.  Pulmonary:     Effort: Pulmonary effort is normal. No respiratory distress.     Breath sounds: Rhonchi present.  Abdominal:     Palpations: Abdomen is soft.     Tenderness: There is no abdominal tenderness. There is no guarding or rebound.  Musculoskeletal:        General: No swelling or tenderness.     Cervical back: Neck supple.  No tenderness.  Skin:    General: Skin is warm and dry.     Capillary Refill: Capillary refill takes less than 2 seconds.     Findings: No erythema.  Neurological:     GCS: GCS eye subscore is 1. GCS verbal subscore is 1. GCS motor subscore is 1.     Comments: Patient GCS is 3 with unresponsiveness.  Pupils were 4 mm and very sluggish.      ED Results / Procedures / Treatments   Labs (all labs ordered are listed, but only abnormal results are displayed) Labs Reviewed  COMPREHENSIVE METABOLIC PANEL WITH GFR - Abnormal; Notable for the following components:      Result Value   Sodium 159 (*)    Potassium 5.2 (*)    Chloride 128 (*)    CO2 10 (*)    Glucose, Bld 433 (*)    BUN 99 (*)     Creatinine, Ser 3.03 (*)    Total Protein 6.0 (*)    Albumin  2.3 (*)    AST 55 (*)    GFR, Estimated 19 (*)    Anion gap 21 (*)    All other components within normal limits  CBC - Abnormal; Notable for the following components:   WBC 17.1 (*)    RBC 3.46 (*)    Hemoglobin 10.3 (*)    HCT 36.2 (*)    MCV 104.6 (*)    MCHC 28.5 (*)    All other components within normal limits  URINALYSIS, ROUTINE W REFLEX MICROSCOPIC - Abnormal; Notable for the following components:   APPearance HAZY (*)    Glucose, UA >=500 (*)    Hgb urine dipstick SMALL (*)    Bacteria, UA RARE (*)    All other components within normal limits  CBG MONITORING, ED - Abnormal; Notable for the following components:   Glucose-Capillary 371 (*)    All other components within normal limits  I-STAT CHEM 8, ED - Abnormal; Notable for the following components:   Sodium 160 (*)    Chloride 130 (*)    BUN 98 (*)    Creatinine, Ser 2.70 (*)    Glucose, Bld 423 (*)    TCO2 11 (*)    Hemoglobin 11.2 (*)    HCT 33.0 (*)    All other components within normal limits  I-STAT VENOUS BLOOD GAS, ED - Abnormal; Notable for the following components:   pH, Ven 6.808 (*)    pO2, Ven 84 (*)    Bicarbonate 8.5 (*)    TCO2 10 (*)    Acid-base deficit 25.0 (*)    Sodium 159 (*)    HCT 30.0 (*)    Hemoglobin 10.2 (*)    All other components within normal limits  I-STAT CG4 LACTIC ACID, ED - Abnormal; Notable for the following components:   Lactic Acid, Venous 11.4 (*)    All other components within normal limits  I-STAT CG4 LACTIC ACID, ED    EKG None  Radiology DG Chest Port 1 View Result Date: 07/05/2023 CLINICAL DATA:  [id number] Pain [id number]. EXAM: PORTABLE CHEST 1 VIEW COMPARISON:  12/25/2022. FINDINGS: There are heterogeneous reticulonodular opacities throughout bilateral lungs with relative sparing of the periphery. Bilateral lung fields are otherwise clear. No dense consolidation or lung collapse. No frank pulmonary edema.  Bilateral costophrenic angles are clear. Stable cardio-mediastinal silhouette. No acute osseous abnormalities. The soft tissues are within normal limits. Endotracheal tube is seen with its tip just below  the level of clavicular heads. However, carina is not confidently identified on this exam. IMPRESSION: *Heterogeneous reticulonodular opacities throughout bilateral lungs with relative sparing of the periphery. Findings are nonspecific and differential diagnosis includes atypical pneumonia, pulmonary edema, pulmonary hemorrhage, etc. Electronically Signed   By: Beula Brunswick M.D.   On: 07/04/2023 16:18    Procedures Procedures    CRITICAL CARE Performed by: Marine Sia Keandre Linden Total critical care time: 40 minutes Critical care time was exclusive of separately billable procedures and treating other patients. Critical care was necessary to treat or prevent imminent or life-threatening deterioration. Critical care was time spent personally by me on the following activities: development of treatment plan with patient and/or surrogate as well as nursing, discussions with consultants, evaluation of patient's response to treatment, examination of patient, obtaining history from patient or surrogate, ordering and performing treatments and interventions, ordering and review of laboratory studies, ordering and review of radiographic studies, pulse oximetry and re-evaluation of patient's condition.  Medications Ordered in ED Medications  EPINEPHrine  (ADRENALIN ) 5 mg in NS 250 mL (0.02 mg/mL) premix infusion (20 mcg/min Intravenous Rate/Dose Verify 06/24/2023 1816)  fentaNYL  (SUBLIMAZE ) injection 25 mcg (has no administration in time range)  fentaNYL  (SUBLIMAZE ) injection 25-100 mcg (has no administration in time range)  midazolam (VERSED) injection 1-2 mg (has no administration in time range)  norepinephrine (LEVOPHED) 4mg  in (0.016 mg/mL) premix infusion (40 mcg/min Intravenous Rate/Dose Verify 07/02/2023  1817)  0.9 %  sodium chloride  infusion (has no administration in time range)  acetaminophen  (TYLENOL ) tablet 650 mg (has no administration in time range)    Or  acetaminophen  (TYLENOL ) suppository 650 mg (has no administration in time range)  polyvinyl alcohol  (LIQUIFILM TEARS) 1.4 % ophthalmic solution 1 drop (has no administration in time range)  morphine  bolus via infusion 5 mg (has no administration in time range)  morphine  100mg  in NS (1mg /mL) infusion - premix (has no administration in time range)  EPINEPHrine  (ADRENALIN ) 1 MG/10ML injection (1 mg Intravenous Given 07/13/2023 1533)  atropine 1 MG/10ML injection (1 mg Intravenous Given 07/06/2023 1531)  0.9 %  sodium chloride  infusion (0 mLs Intravenous Stopped 07/07/2023 1739)    ED Course/ Medical Decision Making/ A&P                                 Medical Decision Making Amount and/or Complexity of Data Reviewed Labs: ordered. Radiology: ordered.  Risk Prescription drug management. Decision regarding hospitalization.    Paul Colon is a 89 y.o. male with a past medical history significant for diabetes, CKD, and dementia who presents for cardiac arrest and choking.  According to EMS report, patient was eating lunch and was eating some breakfast food with sausage and eggs.  He reportedly had a witnessed choking event and bystanders/family tried to do Heimlich maneuver.  He reportedly was partially successful getting some up but then by the time EMS arrived, he had worsened.  They found patient cyanotic and ultimately started CPR.  They were able to use McGill forceps during intubation to pull out a large amount of sausage and eggs and was able to intubate with a 7.0 tube.  Oxygen saturation improved but patient continued to be in cardiac arrest.  Patient ultimately received 4 doses of epinephrine  and received 4 defibrillating shocks for suspected coarse V-fib arrest.  During transport patient obtained ROSC and upon arrival to the  emergency department again lost pulses.  Patient arrives getting compressions with a Dorothea Gata.  On my initial evaluation, we stopped the Salvo and he did have pulses.  Heart rate was in the 30s and 40s.  Atropine was given and started to speed up.  We also gave epinephrine .  We used a glide scope to go through the ET tube and it does appear that is in the correct position with tracheal rings visible however, there is debris and what appears to be eggs in the mainstem bronchus near the carina.  Otherwise patient had coarse breath sounds and was not trying to painful stimuli.  Pupils were about 4 mm and sluggish bilaterally.  He had not received sedation medications and was not responding to pain.  No other evidence of acute trauma.  Family has not yet arrived and we have not yet been in contact however we will continue resuscitative efforts as he is full code.  Pressure started to drop again and Levophed was added to the epi drip.  We decided to start pacing while starting the Levophed drip due to heart rate going into the 20s.  He has not lost pulses and critical care was called.  Critical care is at the bedside to determine disposition and management.  Still awaiting family to arrive.  Anticipate critical care admission.  4:10 PM Critical care was able to speak to family who arrived to the emergency department.  They would like to make him full DNR/DNI and comfort measures.  They are ordering the order set for medications for comfort and extubation.  Will family to the bedside and will anticipate expiration.  4:57 PM Patient's family arrived at the bedside and we had a long conversation.  They are aware of the patient's dire condition and the supportive measures with the intubation, pressors, and external pacing at this time.  They agree with having her remain DNR/DNI and comfort measures however they did request to have some other family members be able to see him before removing all the supportive  measures.  Thus, will call for medical admission to allow family to see him before complete comfort care.  Medicine said the patient was not appropriate for the floor despite his comfort plan as he is still technically intubated on pressors and getting paced.  I called and spoke to critical care who will admit for the family has been sometime before care progresses completely comfort.  Critical care will admit for further management.        Final Clinical Impression(s) / ED Diagnoses Final diagnoses:  Choking due to food in larynx, initial encounter  Cardiac arrest [facility])    Clinical Impression: 1. Choking due to food in larynx, initial encounter   2. Cardiac arrest [facility])     Disposition: Admit for comfort management and family to be at bedside  This note was prepared with assistance of Dragon voice recognition software. Occasional wrong-word or sound-a-like substitutions may have occurred due to the inherent limitations of voice recognition software.      Ysidro Ramsay, Marine Sia, MD 06/14/2023 2203

## 2023-07-14 NOTE — Code Documentation (Signed)
 ROSC, weak pulse.

## 2023-07-14 NOTE — ED Notes (Signed)
APP at bedside 

## 2023-07-14 NOTE — ED Notes (Signed)
 More family has arrived at bedside.

## 2023-07-14 NOTE — Progress Notes (Signed)
 Cross covering ICU physician.   Family exited the room for compassionate extubation. Pt was suctioned prior to extubation tube removed without difficulty by myself and EMT at bedside. Pt was moaning and tachypneic despite premedication and so will start infusion  for continued comfort. Family back at bedside.

## 2023-07-14 NOTE — ED Notes (Signed)
Pacing turned off.

## 2023-07-14 NOTE — Progress Notes (Signed)
 Cross covering ICU physician  Called to speak with family and provide orders for comfort.  Will administer comfort meds, extubate compassionately and then stop pressor support.   Family at bedside and in agreement, daughter at bedside has requested chaplain coming to pray with family prior to extubation.   RN at bedside updated.

## 2023-07-14 DEATH — deceased

## 2023-07-26 ENCOUNTER — Ambulatory Visit: Admitting: Physician Assistant

## 2023-08-08 ENCOUNTER — Ambulatory Visit: Payer: No Typology Code available for payment source | Admitting: Physician Assistant

## 2023-08-13 NOTE — Discharge Summary (Signed)
   DEATH SUMMARY  OF NOTE I WAS NOT IN CARE OF THIS PATIENT OTHER THAN FOR COMPASSIONATE EXTUBATION Patient Details  Name: Paul Colon MRN: [id number] DOB: [birth year] [id number], Nada Auer  Admission/Discharge Information   Admit Date:  26-Jun-2023  Date of Death: Date of Death: 06-26-2023  Time of Death: Time of Death: 08-04-17  Length of Stay: 1   Principle Cause of death: acute hypoxic resp failure 2/2 aspiration   Hospital Diagnoses: Principal Problem:   Aspiration into airway Active Problems:   Cardiac arrest [facility])   Hospital Course: 89 y.o. M who aspirated eggs today and suffered a cardiac arrest with at least 30 mins down time.  He was unresponsive without sedation and severely bradycardic and hypotensive on maximum pressors.  Family at the bedside and made the decision to transfer to comfort care once all family has arrived   5/6:   Pt seen in the ED after cardiac arrest initiated by choking and aspiration event, approximate down time 30 minutes and shocked x4.  Pt was intubated in the field without sedation and has remained unresponsive.  On arrival to the ED, pulses were lost again requiring 1 round of CPR, he was then bradycardic to the 20's and hypotensive requiring maximum Epi and Norepi infusions.  Family meeting with Dr. Zaida Hertz     My update 2023-06-26 at 04-Aug-2024 Cross covering ICU physician   Called to speak with family and provide orders for comfort.  Will administer comfort meds, extubate compassionately and then stop pressor support.    Family at bedside and in agreement, daughter at bedside has requested chaplain coming to pray with family prior to extubation.    RN at bedside updated.             Assessment and Plan: No notes have been filed under this hospital service. Service: Hospitalist       Procedures: intubation in the field prior to presentation  Consultations: none  The results of significant diagnostics from this hospitalization (including  imaging, microbiology, ancillary and laboratory) are listed below for reference.   Significant Diagnostic Studies: No results found.  Microbiology: No results found for this or any previous visit (from the past 240 hours).  Time spent: 0 minutes  Signed: Lezlie Reddish, DO Jun 26, 2023
